# Patient Record
Sex: Female | Born: 1970 | Race: Black or African American | Hispanic: No | Marital: Single
Health system: Southern US, Academic
[De-identification: ages and names within clinical notes are randomized; demographics above are authoritative.]

## PROBLEM LIST (undated history)

## (undated) ENCOUNTER — Encounter

## (undated) ENCOUNTER — Telehealth

## (undated) ENCOUNTER — Encounter: Payer: PRIVATE HEALTH INSURANCE | Attending: Family | Primary: Family

## (undated) ENCOUNTER — Telehealth: Payer: PRIVATE HEALTH INSURANCE

## (undated) ENCOUNTER — Ambulatory Visit: Payer: BLUE CROSS/BLUE SHIELD

## (undated) ENCOUNTER — Ambulatory Visit

## (undated) ENCOUNTER — Telehealth: Attending: Family | Primary: Family

## (undated) ENCOUNTER — Encounter: Attending: Family | Primary: Family

## (undated) ENCOUNTER — Ambulatory Visit: Payer: PRIVATE HEALTH INSURANCE

## (undated) ENCOUNTER — Ambulatory Visit: Payer: Medicaid (Managed Care)

## (undated) ENCOUNTER — Ambulatory Visit: Payer: PRIVATE HEALTH INSURANCE | Attending: Family | Primary: Family

## (undated) ENCOUNTER — Ambulatory Visit: Payer: PRIVATE HEALTH INSURANCE | Attending: Medical | Primary: Medical

## (undated) ENCOUNTER — Ambulatory Visit: Payer: BLUE CROSS/BLUE SHIELD | Attending: Family | Primary: Family

## (undated) ENCOUNTER — Encounter: Attending: Internal Medicine | Primary: Internal Medicine

## (undated) ENCOUNTER — Ambulatory Visit
Payer: PRIVATE HEALTH INSURANCE | Attending: Rehabilitative and Restorative Service Providers" | Primary: Rehabilitative and Restorative Service Providers"

## (undated) ENCOUNTER — Encounter
Attending: Student in an Organized Health Care Education/Training Program | Primary: Student in an Organized Health Care Education/Training Program

## (undated) ENCOUNTER — Ambulatory Visit
Payer: BLUE CROSS/BLUE SHIELD | Attending: Student in an Organized Health Care Education/Training Program | Primary: Student in an Organized Health Care Education/Training Program

## (undated) ENCOUNTER — Ambulatory Visit: Attending: Family | Primary: Family

## (undated) ENCOUNTER — Encounter: Payer: BLUE CROSS/BLUE SHIELD | Attending: Family | Primary: Family

## (undated) DIAGNOSIS — L732 Hidradenitis suppurativa: Secondary | ICD-10-CM

## (undated) DIAGNOSIS — K219 Gastro-esophageal reflux disease without esophagitis: Secondary | ICD-10-CM

## (undated) DIAGNOSIS — E785 Hyperlipidemia, unspecified: Secondary | ICD-10-CM

## (undated) DIAGNOSIS — I10 Essential (primary) hypertension: Secondary | ICD-10-CM

---

## 2005-01-27 ENCOUNTER — Emergency Department: Payer: Self-pay | Admitting: Emergency Medicine

## 2005-07-13 ENCOUNTER — Emergency Department: Payer: Self-pay | Admitting: Emergency Medicine

## 2006-01-22 ENCOUNTER — Emergency Department: Payer: Self-pay | Admitting: Internal Medicine

## 2006-06-15 ENCOUNTER — Emergency Department: Payer: Self-pay | Admitting: Unknown Physician Specialty

## 2006-07-21 ENCOUNTER — Emergency Department: Payer: Self-pay | Admitting: Emergency Medicine

## 2008-10-25 ENCOUNTER — Emergency Department: Payer: Self-pay | Admitting: Internal Medicine

## 2009-01-14 ENCOUNTER — Ambulatory Visit: Payer: Self-pay | Admitting: Family Medicine

## 2009-04-02 ENCOUNTER — Emergency Department: Payer: Self-pay | Admitting: Emergency Medicine

## 2009-04-08 ENCOUNTER — Ambulatory Visit: Payer: Self-pay | Admitting: Family Medicine

## 2009-05-22 ENCOUNTER — Emergency Department: Payer: Self-pay | Admitting: Emergency Medicine

## 2009-08-03 ENCOUNTER — Emergency Department: Payer: Self-pay | Admitting: Emergency Medicine

## 2010-07-12 ENCOUNTER — Emergency Department: Payer: Self-pay | Admitting: Emergency Medicine

## 2011-09-15 ENCOUNTER — Emergency Department: Payer: Self-pay | Admitting: *Deleted

## 2012-08-12 ENCOUNTER — Emergency Department: Payer: Self-pay | Admitting: Internal Medicine

## 2012-08-12 LAB — CBC
MCH: 26.6 pg (ref 26.0–34.0)
MCHC: 33.5 g/dL (ref 32.0–36.0)
MCV: 79 fL — ABNORMAL LOW (ref 80–100)
Platelet: 313 10*3/uL (ref 150–440)
RBC: 4.74 10*6/uL (ref 3.80–5.20)
RDW: 13.4 % (ref 11.5–14.5)

## 2012-08-12 LAB — COMPREHENSIVE METABOLIC PANEL
Anion Gap: 12 (ref 7–16)
Bilirubin,Total: 0.5 mg/dL (ref 0.2–1.0)
Calcium, Total: 8.9 mg/dL (ref 8.5–10.1)
Chloride: 107 mmol/L (ref 98–107)
Co2: 23 mmol/L (ref 21–32)
Creatinine: 1.03 mg/dL (ref 0.60–1.30)
EGFR (African American): >60
EGFR (Non-African Amer.): >60
Osmolality: 282 (ref 275–301)
Potassium: 3.5 mmol/L (ref 3.5–5.1)
SGOT(AST): 19 U/L (ref 15–37)
SGPT (ALT): 22 U/L (ref 12–78)

## 2012-08-12 LAB — CK TOTAL AND CKMB (NOT AT ARMC)
CK, Total: 76 U/L (ref 21–215)
CK-MB: 0.6 ng/mL (ref 0.5–3.6)

## 2012-08-12 LAB — TROPONIN I: Troponin-I: <0.02 ng/mL

## 2012-09-14 ENCOUNTER — Emergency Department: Payer: Self-pay | Admitting: Internal Medicine

## 2012-09-14 LAB — CBC
HCT: 36.5 % (ref 35.0–47.0)
HGB: 12.3 g/dL (ref 12.0–16.0)
MCH: 26.6 pg (ref 26.0–34.0)
MCV: 79 fL — ABNORMAL LOW (ref 80–100)
Platelet: 299 10*3/uL (ref 150–440)
RBC: 4.6 10*6/uL (ref 3.80–5.20)
RDW: 13.2 % (ref 11.5–14.5)
WBC: 7 10*3/uL (ref 3.6–11.0)

## 2012-09-14 LAB — COMPREHENSIVE METABOLIC PANEL
Albumin: 3.8 g/dL (ref 3.4–5.0)
Alkaline Phosphatase: 62 U/L (ref 50–136)
Bilirubin,Total: 0.5 mg/dL (ref 0.2–1.0)
Calcium, Total: 8.8 mg/dL (ref 8.5–10.1)
Co2: 26 mmol/L (ref 21–32)
Creatinine: 0.87 mg/dL (ref 0.60–1.30)
Glucose: 96 mg/dL (ref 65–99)
Osmolality: 282 (ref 275–301)
SGOT(AST): 19 U/L (ref 15–37)

## 2012-09-14 LAB — URINALYSIS, COMPLETE
Bilirubin,UR: NEGATIVE
Blood: NEGATIVE
Glucose,UR: NEGATIVE mg/dL (ref 0–75)
Ketone: NEGATIVE
Leukocyte Esterase: NEGATIVE
Nitrite: NEGATIVE
Ph: 6 (ref 4.5–8.0)
Protein: NEGATIVE
RBC,UR: 1 /HPF (ref 0–5)
Specific Gravity: 1.027 (ref 1.003–1.030)

## 2012-09-14 LAB — PREGNANCY, URINE: Pregnancy Test, Urine: NEGATIVE m[IU]/mL

## 2012-09-14 LAB — TROPONIN I: Troponin-I: <0.02 ng/mL

## 2012-11-28 ENCOUNTER — Emergency Department: Payer: Self-pay | Admitting: Internal Medicine

## 2012-11-30 LAB — BETA STREP CULTURE(ARMC)

## 2012-12-16 ENCOUNTER — Emergency Department: Payer: Self-pay | Admitting: Emergency Medicine

## 2012-12-16 LAB — CBC
HCT: 38.8 % (ref 35.0–47.0)
MCH: 26.5 pg (ref 26.0–34.0)
MCHC: 33.2 g/dL (ref 32.0–36.0)
MCV: 80 fL (ref 80–100)
RBC: 4.86 10*6/uL (ref 3.80–5.20)
WBC: 5.6 10*3/uL (ref 3.6–11.0)

## 2012-12-16 LAB — CK TOTAL AND CKMB (NOT AT ARMC)
CK, Total: 61 U/L (ref 21–215)
CK-MB: <0.5 ng/mL — ABNORMAL LOW (ref 0.5–3.6)

## 2012-12-16 LAB — BASIC METABOLIC PANEL
Anion Gap: 6 — ABNORMAL LOW (ref 7–16)
BUN: 15 mg/dL (ref 7–18)
Co2: 25 mmol/L (ref 21–32)
Creatinine: 0.88 mg/dL (ref 0.60–1.30)
Glucose: 104 mg/dL — ABNORMAL HIGH (ref 65–99)
Potassium: 4.1 mmol/L (ref 3.5–5.1)

## 2012-12-16 LAB — TROPONIN I: Troponin-I: <0.02 ng/mL

## 2013-01-09 ENCOUNTER — Emergency Department: Payer: Self-pay | Admitting: Emergency Medicine

## 2013-01-09 LAB — CBC
HCT: 40.2 % (ref 35.0–47.0)
HGB: 13.3 g/dL (ref 12.0–16.0)
MCH: 26.4 pg (ref 26.0–34.0)
Platelet: 269 10*3/uL (ref 150–440)
RBC: 5.02 10*6/uL (ref 3.80–5.20)

## 2013-01-09 LAB — BASIC METABOLIC PANEL
BUN: 15 mg/dL (ref 7–18)
Calcium, Total: 8.8 mg/dL (ref 8.5–10.1)
Creatinine: 0.86 mg/dL (ref 0.60–1.30)
EGFR (African American): >60
EGFR (Non-African Amer.): >60
Glucose: 95 mg/dL (ref 65–99)
Osmolality: 276 (ref 275–301)
Potassium: 4.4 mmol/L (ref 3.5–5.1)
Sodium: 138 mmol/L (ref 136–145)

## 2013-01-09 LAB — TROPONIN I: Troponin-I: <0.02 ng/mL

## 2013-04-28 ENCOUNTER — Emergency Department: Payer: Self-pay | Admitting: Emergency Medicine

## 2013-04-28 LAB — TROPONIN I: Troponin-I: <0.02 ng/mL

## 2013-04-28 LAB — COMPREHENSIVE METABOLIC PANEL
BUN: 13 mg/dL (ref 7–18)
Calcium, Total: 9 mg/dL (ref 8.5–10.1)
Creatinine: 0.95 mg/dL (ref 0.60–1.30)
EGFR (African American): >60
EGFR (Non-African Amer.): >60
Osmolality: 279 (ref 275–301)
Potassium: 3.7 mmol/L (ref 3.5–5.1)
SGOT(AST): 13 U/L — ABNORMAL LOW (ref 15–37)
SGPT (ALT): 19 U/L (ref 12–78)
Total Protein: 7.5 g/dL (ref 6.4–8.2)

## 2013-04-28 LAB — CBC
HCT: 36.4 % (ref 35.0–47.0)
HGB: 12.3 g/dL (ref 12.0–16.0)
MCH: 26.7 pg (ref 26.0–34.0)
MCHC: 33.8 g/dL (ref 32.0–36.0)
RBC: 4.61 10*6/uL (ref 3.80–5.20)

## 2013-08-23 ENCOUNTER — Emergency Department: Payer: Self-pay | Admitting: Emergency Medicine

## 2013-08-23 LAB — CBC
HGB: 12.5 g/dL (ref 12.0–16.0)
MCH: 26.5 pg (ref 26.0–34.0)
RBC: 4.73 10*6/uL (ref 3.80–5.20)
WBC: 7.9 10*3/uL (ref 3.6–11.0)

## 2013-08-23 LAB — BASIC METABOLIC PANEL
Anion Gap: 7 (ref 7–16)
Calcium, Total: 9.4 mg/dL (ref 8.5–10.1)
Creatinine: 0.93 mg/dL (ref 0.60–1.30)
EGFR (Non-African Amer.): >60
Potassium: 3.7 mmol/L (ref 3.5–5.1)

## 2013-10-20 ENCOUNTER — Emergency Department: Payer: Self-pay | Admitting: Emergency Medicine

## 2013-11-06 ENCOUNTER — Emergency Department: Payer: Self-pay | Admitting: Emergency Medicine

## 2013-11-06 LAB — URINALYSIS, COMPLETE
Bilirubin,UR: NEGATIVE
Ketone: NEGATIVE
Ph: 6 (ref 4.5–8.0)
Specific Gravity: 1.026 (ref 1.003–1.030)
Squamous Epithelial: 14

## 2013-11-06 LAB — CBC
HCT: 37.5 % (ref 35.0–47.0)
MCH: 26 pg (ref 26.0–34.0)
MCHC: 33 g/dL (ref 32.0–36.0)
RBC: 4.75 10*6/uL (ref 3.80–5.20)
WBC: 9.5 10*3/uL (ref 3.6–11.0)

## 2013-11-06 LAB — TROPONIN I: Troponin-I: <0.02 ng/mL

## 2013-11-06 LAB — BASIC METABOLIC PANEL
Anion Gap: 4 — ABNORMAL LOW (ref 7–16)
BUN: 14 mg/dL (ref 7–18)
Chloride: 106 mmol/L (ref 98–107)
Co2: 29 mmol/L (ref 21–32)
Creatinine: 0.84 mg/dL (ref 0.60–1.30)
EGFR (African American): >60
Osmolality: 278 (ref 275–301)

## 2014-01-18 ENCOUNTER — Emergency Department: Payer: Self-pay | Admitting: Emergency Medicine

## 2014-01-19 ENCOUNTER — Emergency Department: Payer: Self-pay | Admitting: Internal Medicine

## 2014-05-08 ENCOUNTER — Emergency Department: Payer: Self-pay | Admitting: Emergency Medicine

## 2014-06-15 ENCOUNTER — Emergency Department: Payer: Self-pay | Admitting: Emergency Medicine

## 2014-06-17 IMAGING — CR DG CHEST 2V
1 series · 3 of 3 positions shown · non-contrast
Comparison: none

REASON FOR EXAM: Chest Pain
COMMENTS:

PROCEDURE:     DXR - DXR CHEST PA (OR AP) AND LATERAL  - December 16, 2012  [DATE]
RESULT:     Comparison: 09/14/2012

[Series 1: pa · 0.17mm/px · 3 of 3 slices shown]
[im 1/3]
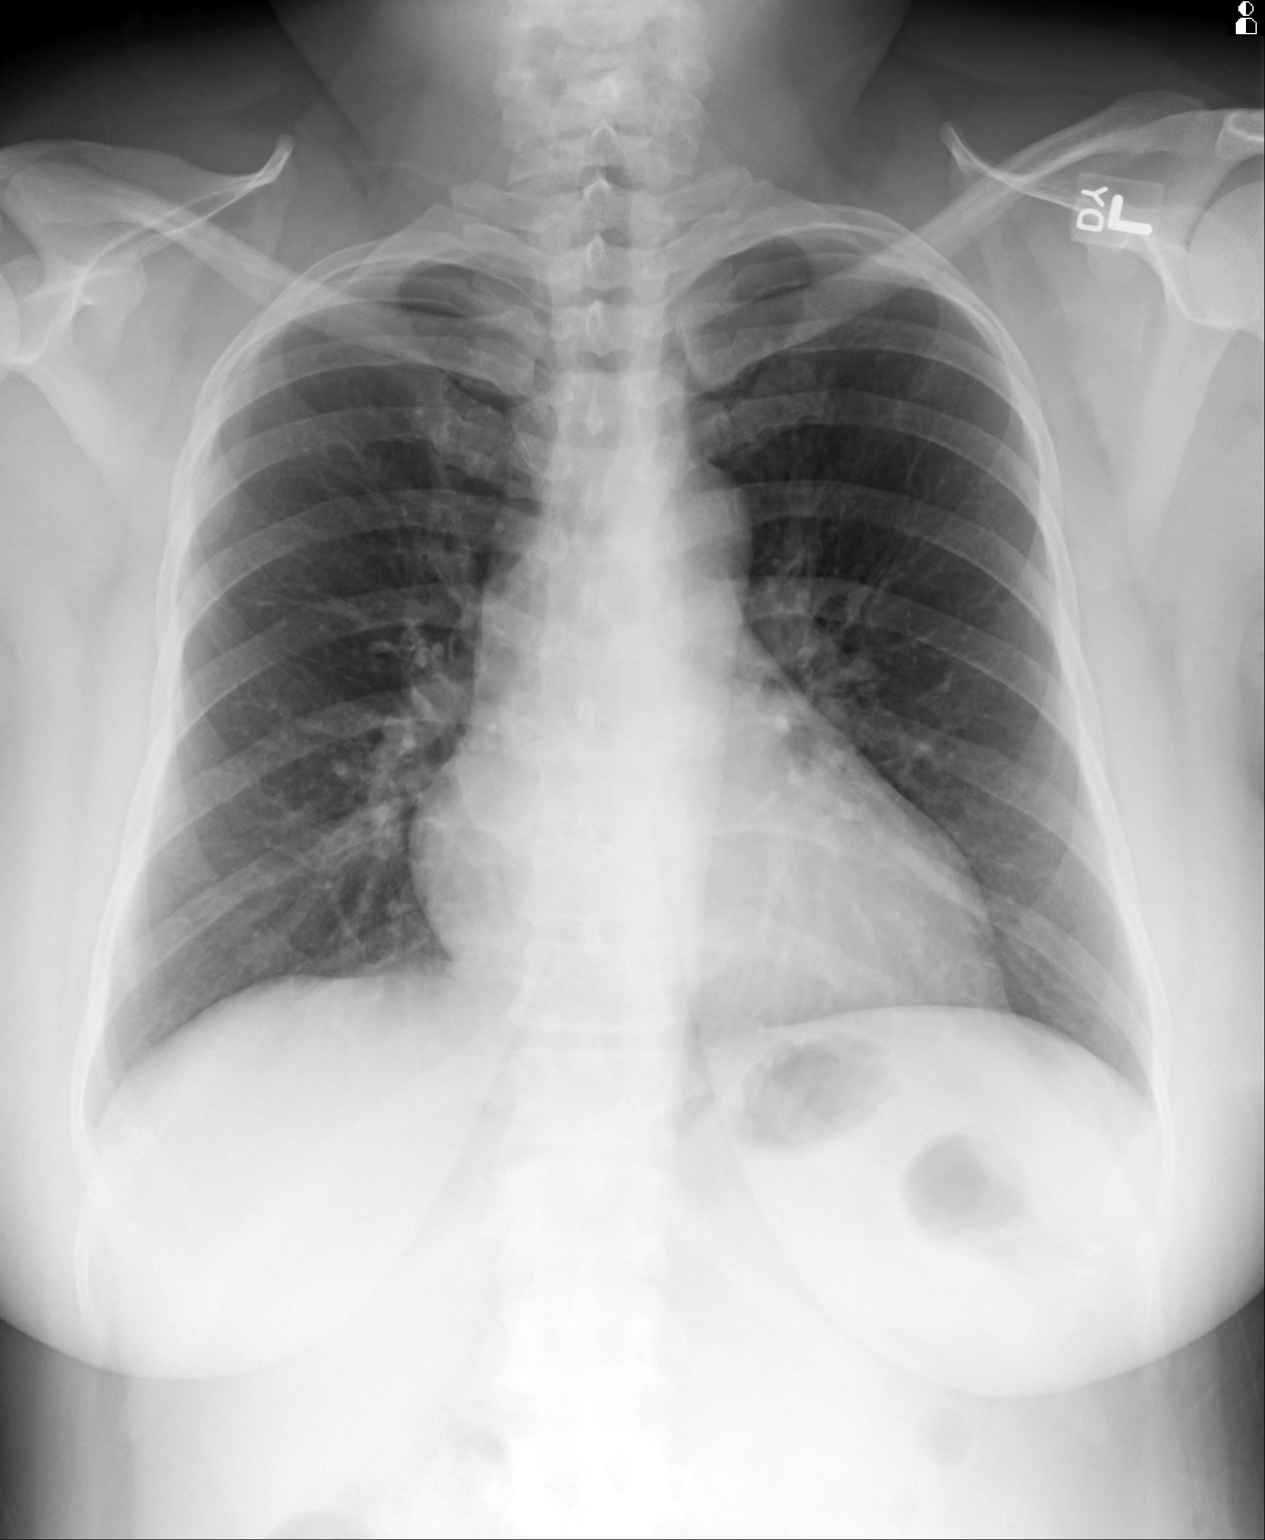
[im 2/3]
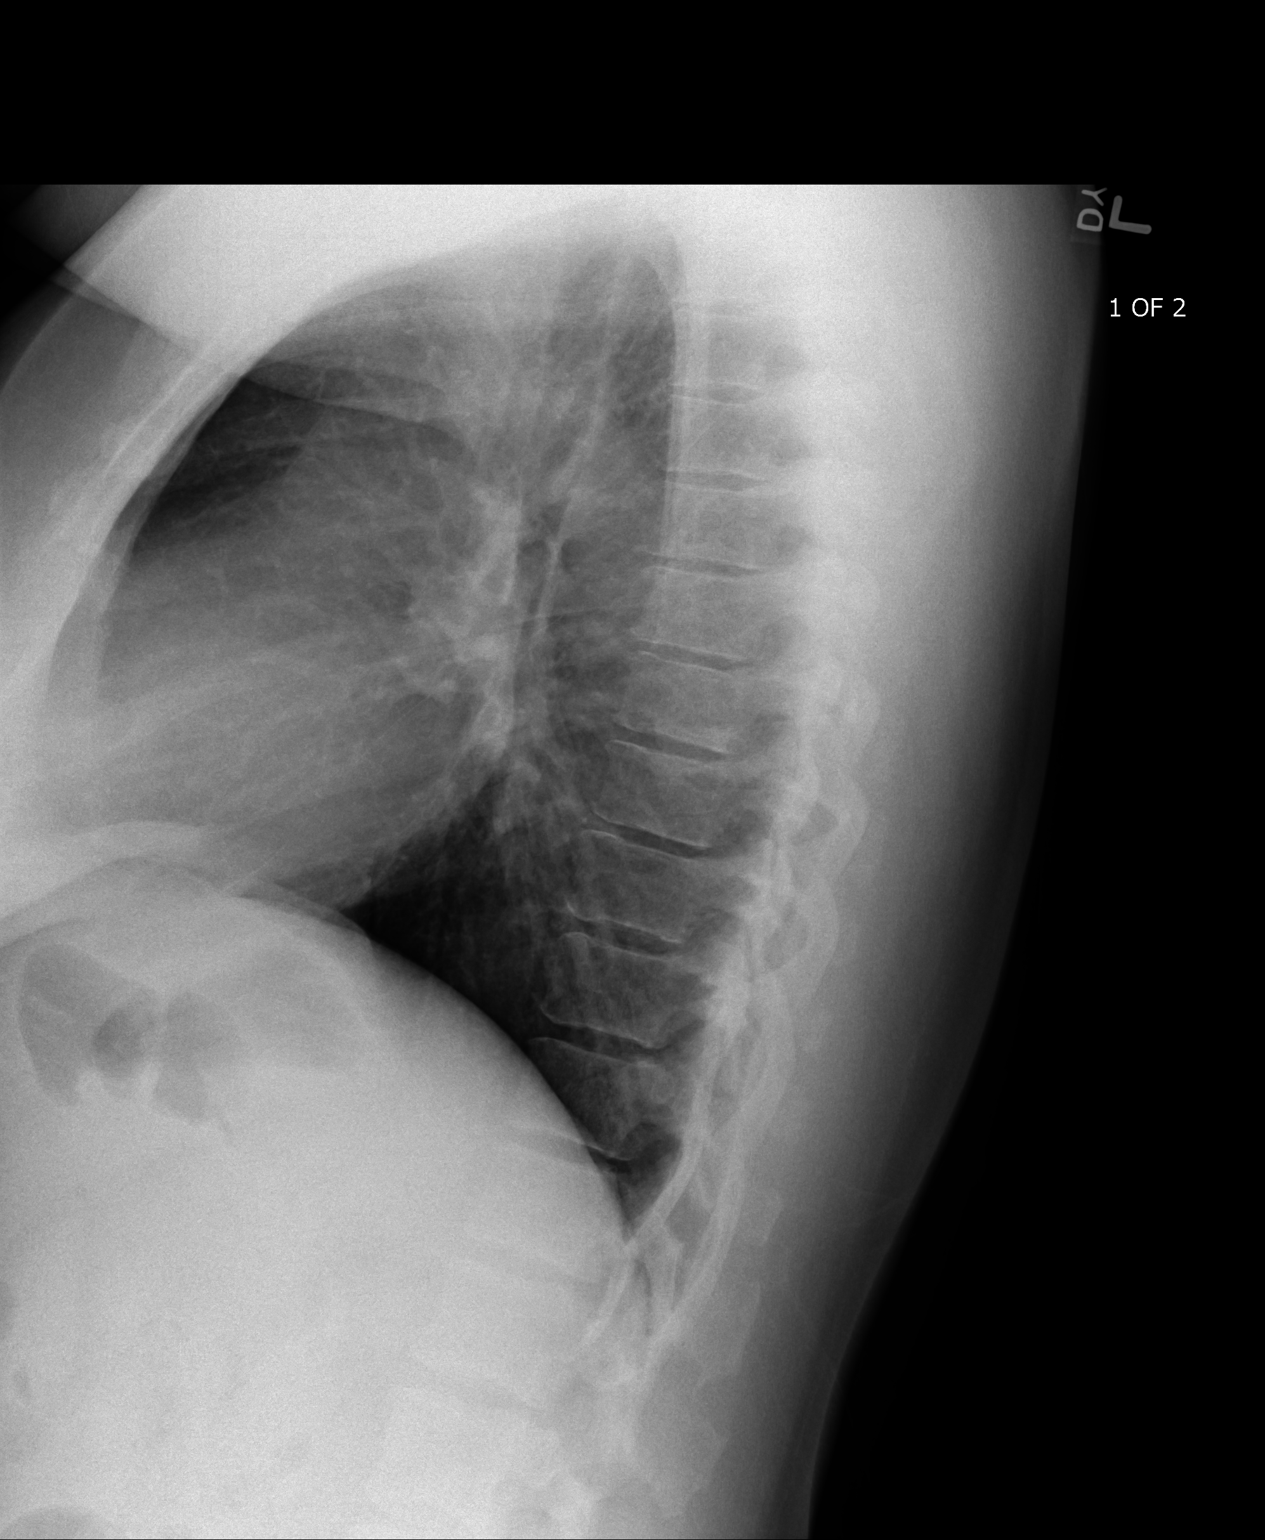
[im 3/3]
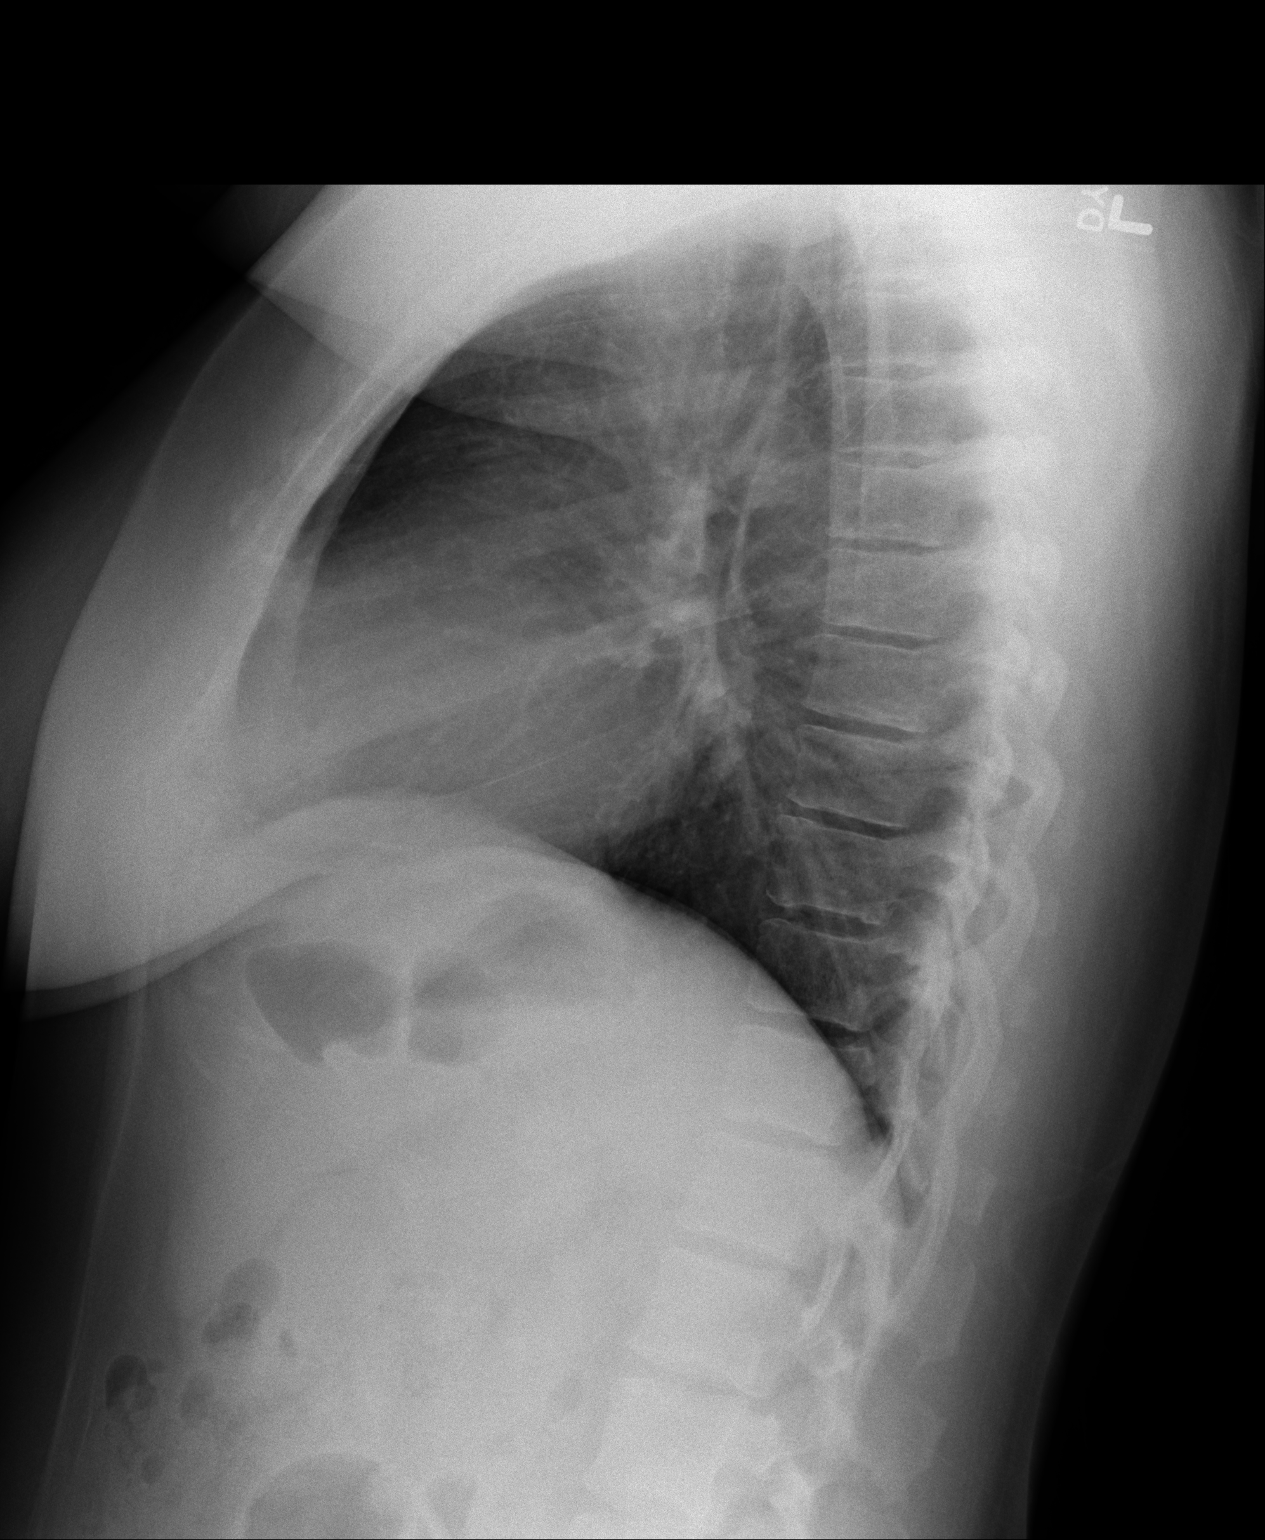

[3 of 3 positions shown; findings below may reference images not displayed]

FINDINGS: Heart size upper limits of normal. No focal pulmonary opacities.
IMPRESSION: No acute cardiopulmonary disease.

[REDACTED]

## 2014-06-19 ENCOUNTER — Emergency Department: Payer: Self-pay | Admitting: Emergency Medicine

## 2014-10-05 ENCOUNTER — Emergency Department: Payer: Self-pay | Admitting: Internal Medicine

## 2014-10-07 LAB — BETA STREP CULTURE(ARMC)

## 2014-10-11 ENCOUNTER — Emergency Department: Payer: Self-pay | Admitting: Internal Medicine

## 2014-10-11 LAB — COMPREHENSIVE METABOLIC PANEL
ANION GAP: 6 — AB (ref 7–16)
AST: 13 U/L — AB (ref 15–37)
Albumin: 3.6 g/dL (ref 3.4–5.0)
Alkaline Phosphatase: 79 U/L
BILIRUBIN TOTAL: 0.4 mg/dL (ref 0.2–1.0)
BUN: 14 mg/dL (ref 7–18)
CALCIUM: 8.9 mg/dL (ref 8.5–10.1)
Chloride: 105 mmol/L (ref 98–107)
Co2: 27 mmol/L (ref 21–32)
Creatinine: 0.81 mg/dL (ref 0.60–1.30)
EGFR (African American): >60
EGFR (Non-African Amer.): >60
GLUCOSE: 105 mg/dL — AB (ref 65–99)
Osmolality: 277 (ref 275–301)
POTASSIUM: 3.9 mmol/L (ref 3.5–5.1)
SGPT (ALT): 24 U/L
Sodium: 138 mmol/L (ref 136–145)
Total Protein: 7.5 g/dL (ref 6.4–8.2)

## 2014-10-11 LAB — URINALYSIS, COMPLETE
BILIRUBIN, UR: NEGATIVE
Bacteria: NONE SEEN
Glucose,UR: NEGATIVE mg/dL (ref 0–75)
KETONE: NEGATIVE
LEUKOCYTE ESTERASE: NEGATIVE
Nitrite: NEGATIVE
Ph: 6 (ref 4.5–8.0)
RBC,UR: 2051 /HPF (ref 0–5)
Specific Gravity: 1.01 (ref 1.003–1.030)
WBC UR: 4 /HPF (ref 0–5)

## 2014-10-11 LAB — CBC
HCT: 39.6 % (ref 35.0–47.0)
HGB: 12.7 g/dL (ref 12.0–16.0)
MCH: 26.1 pg (ref 26.0–34.0)
MCHC: 32 g/dL (ref 32.0–36.0)
MCV: 82 fL (ref 80–100)
Platelet: 305 10*3/uL (ref 150–440)
RBC: 4.87 10*6/uL (ref 3.80–5.20)
RDW: 13.9 % (ref 11.5–14.5)
WBC: 7.5 10*3/uL (ref 3.6–11.0)

## 2014-10-28 ENCOUNTER — Emergency Department: Payer: Self-pay | Admitting: Emergency Medicine

## 2014-10-28 LAB — INFLUENZA A,B,H1N1 - PCR (ARMC)
H1N1FLUPCR: NOT DETECTED
Influenza A By PCR: NEGATIVE
Influenza B By PCR: NEGATIVE

## 2014-10-30 ENCOUNTER — Emergency Department: Payer: Self-pay | Admitting: Emergency Medicine

## 2014-11-01 LAB — BETA STREP CULTURE(ARMC)

## 2014-11-02 ENCOUNTER — Emergency Department: Payer: Self-pay | Admitting: Emergency Medicine

## 2014-11-02 LAB — COMPREHENSIVE METABOLIC PANEL
ALK PHOS: 93 U/L
Albumin: 3.2 g/dL — ABNORMAL LOW (ref 3.4–5.0)
Anion Gap: 9 (ref 7–16)
BUN: 14 mg/dL (ref 7–18)
Bilirubin,Total: 0.2 mg/dL (ref 0.2–1.0)
CALCIUM: 8.8 mg/dL (ref 8.5–10.1)
CO2: 27 mmol/L (ref 21–32)
Chloride: 105 mmol/L (ref 98–107)
Creatinine: 0.86 mg/dL (ref 0.60–1.30)
EGFR (African American): >60
EGFR (Non-African Amer.): >60
GLUCOSE: 110 mg/dL — AB (ref 65–99)
Osmolality: 282 (ref 275–301)
POTASSIUM: 3.5 mmol/L (ref 3.5–5.1)
SGOT(AST): 20 U/L (ref 15–37)
SGPT (ALT): 66 U/L — ABNORMAL HIGH
SODIUM: 141 mmol/L (ref 136–145)
TOTAL PROTEIN: 7.3 g/dL (ref 6.4–8.2)

## 2014-11-02 LAB — CBC WITH DIFFERENTIAL/PLATELET
BASOS PCT: 0.6 %
Basophil #: 0.1 10*3/uL (ref 0.0–0.1)
Eosinophil #: 0.3 10*3/uL (ref 0.0–0.7)
Eosinophil %: 2.9 %
HCT: 36.6 % (ref 35.0–47.0)
HGB: 11.7 g/dL — AB (ref 12.0–16.0)
LYMPHS ABS: 3 10*3/uL (ref 1.0–3.6)
LYMPHS PCT: 30.7 %
MCH: 25.9 pg — AB (ref 26.0–34.0)
MCHC: 31.8 g/dL — ABNORMAL LOW (ref 32.0–36.0)
MCV: 81 fL (ref 80–100)
MONO ABS: 0.9 x10 3/mm (ref 0.2–0.9)
MONOS PCT: 9.6 %
Neutrophil #: 5.4 10*3/uL (ref 1.4–6.5)
Neutrophil %: 56.2 %
Platelet: 309 10*3/uL (ref 150–440)
RBC: 4.5 10*6/uL (ref 3.80–5.20)
RDW: 13.7 % (ref 11.5–14.5)
WBC: 9.6 10*3/uL (ref 3.6–11.0)

## 2014-11-02 LAB — LIPASE, BLOOD: LIPASE: 132 U/L (ref 73–393)

## 2014-11-02 LAB — TROPONIN I: Troponin-I: <0.02 ng/mL

## 2015-04-07 ENCOUNTER — Encounter: Payer: Self-pay | Admitting: Emergency Medicine

## 2015-04-07 ENCOUNTER — Emergency Department
Admission: EM | Admit: 2015-04-07 | Discharge: 2015-04-07 | Disposition: A | Discharge status: Home / Self Care | Payer: Self-pay | Attending: Emergency Medicine | Admitting: Emergency Medicine

## 2015-04-07 ENCOUNTER — Emergency Department: Payer: Self-pay

## 2015-04-07 DIAGNOSIS — I10 Essential (primary) hypertension: Secondary | ICD-10-CM | POA: Insufficient documentation

## 2015-04-07 DIAGNOSIS — M25561 Pain in right knee: Secondary | ICD-10-CM | POA: Insufficient documentation

## 2015-04-07 HISTORY — DX: Essential (primary) hypertension: I10

## 2015-04-07 MED ORDER — IBUPROFEN 600 MG PO TABS: 600.0000 mg | ORAL_TABLET | Freq: Four times a day (QID) | ORAL | Status: DC | PRN

## 2015-04-07 NOTE — ED Notes (Signed)
Right knee pain no swelling.  Pt says no injury.  Pain especially with movement of joint and also with weight bearing

## 2015-04-07 NOTE — ED Provider Notes (Signed)
[facility] Emergency Department Provider Note   ____________________________________________  Time seen: 1500  I have reviewed the triage vital signs and the nursing notes.   HISTORY  Chief Complaint Knee Pain   History limited by: Not Limited   HPI Alice Stephens is a 43 y.o. female who comes to the emergency departmenttoday because of right knee pain. The pain started yesterday. The patient denies any trauma to her knee. States pain is located over patella. It is worse with bending of the knee. It is worse with pressure to the knee. Sharp. States she has not tried anything for the pain. Has not noticed any swelling or warmth to the joint. No fevers, vomiting.     Past Medical History  Diagnosis Date  . Hypertension     There are no active problems to display for this patient.   History reviewed. No pertinent past surgical history.  No current outpatient prescriptions on file.  Allergies Review of patient's allergies indicates no known allergies.  History reviewed. No pertinent family history.  Social History History  Substance Use Topics  . Smoking status: Never Smoker   . Smokeless tobacco: Not on file  . Alcohol Use: No    Review of Systems  Constitutional: Negative for fever. Cardiovascular: Negative for chest pain. Respiratory: Negative for shortness of breath. Gastrointestinal: Negative for abdominal pain, vomiting and diarrhea. Genitourinary: Negative for dysuria. Musculoskeletal: Negative for back pain. Right knee pain Skin: Negative for rash. Neurological: Negative for headaches, focal weakness or numbness.  10-point ROS otherwise negative.  ____________________________________________   PHYSICAL EXAM:  VITAL SIGNS: ED Triage Vitals  Enc Vitals Group     BP 04/07/15 1420 157/112 mmHg     Pulse Rate 04/07/15 1420 92     Resp 04/07/15 1420 14     Temp --      Temp src --      SpO2 04/07/15 1420 97 %      Weight --      Height --      Head Cir --      Peak Flow --      Pain Score 04/07/15 1420 4   Constitutional: Alert and oriented. Well appearing and in no distress. Eyes: Conjunctivae are normal. PERRL. Normal extraocular movements. ENT   Head: Normocephalic and atraumatic.   Nose: No congestion/rhinnorhea.   Mouth/Throat: Mucous membranes are moist.   Neck: No stridor. Cardiovascular: Normal rate, regular rhythm.   Respiratory: Normal respiratory effort without tachypnea nor retractions.  Genitourinary: Deferred Musculoskeletal: Right knee without any effusion, warmth or erythema. Mild tenderness to flexion of the knee. Sensation intact distally. Neurologic:  Normal speech and language. No gross focal neurologic deficits are appreciated. Speech is normal.  Skin:  Skin is warm, dry and intact. No rash noted. Psychiatric: Mood and affect are normal. Speech and behavior are normal. Patient exhibits appropriate insight and judgment.  ____________________________________________    LABS (pertinent positives/negatives)  None  ____________________________________________   EKG  None  ____________________________________________    RADIOLOGY  Right Knee x-ray  IMPRESSION: Mild osteoarthritic change. No fracture or joint effusion. ____________________________________________   PROCEDURES  Procedure(s) performed: None  Critical Care performed: No  ____________________________________________   INITIAL IMPRESSION / ASSESSMENT AND PLAN / ED COURSE  Pertinent labs & imaging results that were available during my care of the patient were reviewed by me and considered in my medical decision making (see chart for details).  Patient here with right knee pain for  2 days. Will get x-ray.  ----------------------------------------- 3:39 PM on 04/07/2015 -----------------------------------------  X-ray negative. Will discharge home with high-dose  ibuprofen.  ____________________________________________   FINAL CLINICAL IMPRESSION(S) / ED DIAGNOSES  Final diagnoses:  Right knee pain     Phineas SemenGraydon Theodosia Bahena, MD 04/07/15 1539

## 2015-04-07 NOTE — Discharge Instructions (Signed)
Please seek medical attention for any high fevers, chest pain, shortness of breath, change in behavior, persistent vomiting, bloody stool or any other new or concerning symptoms. °Arthritis, Nonspecific °Arthritis is inflammation of a joint. This usually means pain, redness, warmth or swelling are present. One or more joints may be involved. There are a number of types of arthritis. Your caregiver may not be able to tell what type of arthritis you have right away. °CAUSES  °The most common cause of arthritis is the wear and tear on the joint (osteoarthritis). This causes damage to the cartilage, which can break down over time. The knees, hips, back and neck are most often affected by this type of arthritis. °Other types of arthritis and common causes of joint pain include: °· Sprains and other injuries near the joint. Sometimes minor sprains and injuries cause pain and swelling that develop hours later. °· Rheumatoid arthritis. This affects hands, feet and knees. It usually affects both sides of your body at the same time. It is often associated with chronic ailments, fever, weight loss and general weakness. °· Crystal arthritis. Gout and pseudo gout can cause occasional acute severe pain, redness and swelling in the foot, ankle, or knee. °· Infectious arthritis. Bacteria can get into a joint through a break in overlying skin. This can cause infection of the joint. Bacteria and viruses can also spread through the blood and affect your joints. °· Drug, infectious and allergy reactions. Sometimes joints can become mildly painful and slightly swollen with these types of illnesses. °SYMPTOMS  °· Pain is the main symptom. °· Your joint or joints can also be red, swollen and warm or hot to the touch. °· You may have a fever with certain types of arthritis, or even feel overall ill. °· The joint with arthritis will hurt with movement. Stiffness is present with some types of arthritis. °DIAGNOSIS  °Your caregiver will  suspect arthritis based on your description of your symptoms and on your exam. Testing may be needed to find the type of arthritis: °· Blood and sometimes urine tests. °· X-ray tests and sometimes CT or MRI scans. °· Removal of fluid from the joint (arthrocentesis) is done to check for bacteria, crystals or other causes. Your caregiver (or a specialist) will numb the area over the joint with a local anesthetic, and use a needle to remove joint fluid for examination. This procedure is only minimally uncomfortable. °· Even with these tests, your caregiver may not be able to tell what kind of arthritis you have. Consultation with a specialist (rheumatologist) may be helpful. °TREATMENT  °Your caregiver will discuss with you treatment specific to your type of arthritis. If the specific type cannot be determined, then the following general recommendations may apply. °Treatment of severe joint pain includes: °· Rest. °· Elevation. °· Anti-inflammatory medication (for example, ibuprofen) may be prescribed. Avoiding activities that cause increased pain. °· Only take over-the-counter or prescription medicines for pain and discomfort as recommended by your caregiver. °· Cold packs over an inflamed joint may be used for 10 to 15 minutes every hour. Hot packs sometimes feel better, but do not use overnight. Do not use hot packs if you are diabetic without your caregiver's permission. °· A cortisone shot into arthritic joints may help reduce pain and swelling. °· Any acute arthritis that gets worse over the next 1 to 2 days needs to be looked at to be sure there is no joint infection. °Long-term arthritis treatment involves modifying activities and lifestyle   to reduce joint stress jarring. This can include weight loss. Also, exercise is needed to nourish the joint cartilage and remove waste. This helps keep the muscles around the joint strong. °HOME CARE INSTRUCTIONS  °· Do not take aspirin to relieve pain if gout is suspected.  This elevates uric acid levels. °· Only take over-the-counter or prescription medicines for pain, discomfort or fever as directed by your caregiver. °· Rest the joint as much as possible. °· If your joint is swollen, keep it elevated. °· Use crutches if the painful joint is in your leg. °· Drinking plenty of fluids may help for certain types of arthritis. °· Follow your caregiver's dietary instructions. °· Try low-impact exercise such as: °¨ Swimming. °¨ Water aerobics. °¨ Biking. °¨ Walking. °· Morning stiffness is often relieved by a warm shower. °· Put your joints through regular range-of-motion. °SEEK MEDICAL CARE IF:  °· You do not feel better in 24 hours or are getting worse. °· You have side effects to medications, or are not getting better with treatment. °SEEK IMMEDIATE MEDICAL CARE IF:  °· You have a fever. °· You develop severe joint pain, swelling or redness. °· Many joints are involved and become painful and swollen. °· There is severe back pain and/or leg weakness. °· You have loss of bowel or bladder control. °Document Released: 12/01/2004 Document Revised: 01/16/2012 Document Reviewed: 12/17/2008 °ExitCare® Patient Information ©2015 [employer]. This information is not intended to replace advice given to you by your health care provider. Make sure you discuss any questions you have with your health care provider. ° °

## 2015-06-07 ENCOUNTER — Other Ambulatory Visit: Payer: Self-pay

## 2015-06-07 ENCOUNTER — Emergency Department
Admission: EM | Admit: 2015-06-07 | Discharge: 2015-06-07 | Disposition: A | Discharge status: Home / Self Care | Payer: Self-pay | Attending: Emergency Medicine | Admitting: Emergency Medicine

## 2015-06-07 ENCOUNTER — Encounter: Payer: Self-pay | Admitting: *Deleted

## 2015-06-07 DIAGNOSIS — I10 Essential (primary) hypertension: Secondary | ICD-10-CM | POA: Insufficient documentation

## 2015-06-07 DIAGNOSIS — Z3202 Encounter for pregnancy test, result negative: Secondary | ICD-10-CM | POA: Insufficient documentation

## 2015-06-07 DIAGNOSIS — K2970 Gastritis, unspecified, without bleeding: Secondary | ICD-10-CM | POA: Insufficient documentation

## 2015-06-07 HISTORY — DX: Gastro-esophageal reflux disease without esophagitis: K21.9

## 2015-06-07 LAB — COMPREHENSIVE METABOLIC PANEL
ALBUMIN: 4.3 g/dL (ref 3.5–5.0)
ALT: 18 U/L (ref 14–54)
ANION GAP: 9 (ref 5–15)
AST: 18 U/L (ref 15–41)
Alkaline Phosphatase: 68 U/L (ref 38–126)
BUN: 10 mg/dL (ref 6–20)
CALCIUM: 9.2 mg/dL (ref 8.9–10.3)
CO2: 24 mmol/L (ref 22–32)
Chloride: 105 mmol/L (ref 101–111)
Creatinine, Ser: 0.72 mg/dL (ref 0.44–1.00)
GFR calc Af Amer: >60 mL/min (ref >60)
GLUCOSE: 105 mg/dL — AB (ref 65–99)
POTASSIUM: 3.7 mmol/L (ref 3.5–5.1)
Sodium: 138 mmol/L (ref 135–145)
Total Bilirubin: 0.4 mg/dL (ref 0.3–1.2)
Total Protein: 7.5 g/dL (ref 6.5–8.1)

## 2015-06-07 LAB — CBC WITH DIFFERENTIAL/PLATELET
BASOS ABS: 0.1 10*3/uL (ref 0–0.1)
BASOS PCT: 1 %
EOS ABS: 0.2 10*3/uL (ref 0–0.7)
EOS PCT: 3 %
HCT: 39.6 % (ref 35.0–47.0)
Hemoglobin: 12.7 g/dL (ref 12.0–16.0)
LYMPHS PCT: 31 %
Lymphs Abs: 2.1 10*3/uL (ref 1.0–3.6)
MCH: 25.5 pg — AB (ref 26.0–34.0)
MCHC: 32.1 g/dL (ref 32.0–36.0)
MCV: 79.2 fL — AB (ref 80.0–100.0)
Monocytes Absolute: 0.5 10*3/uL (ref 0.2–0.9)
Monocytes Relative: 7 %
Neutro Abs: 4.1 10*3/uL (ref 1.4–6.5)
Neutrophils Relative %: 58 %
PLATELETS: 300 10*3/uL (ref 150–440)
RBC: 5 MIL/uL (ref 3.80–5.20)
RDW: 14.1 % (ref 11.5–14.5)
WBC: 6.9 10*3/uL (ref 3.6–11.0)

## 2015-06-07 LAB — URINALYSIS COMPLETE WITH MICROSCOPIC (ARMC ONLY)
Bilirubin Urine: NEGATIVE
GLUCOSE, UA: NEGATIVE mg/dL
HGB URINE DIPSTICK: NEGATIVE
KETONES UR: NEGATIVE mg/dL
LEUKOCYTES UA: NEGATIVE
Nitrite: NEGATIVE
Protein, ur: NEGATIVE mg/dL
SPECIFIC GRAVITY, URINE: 1.018 (ref 1.005–1.030)
pH: 6 (ref 5.0–8.0)

## 2015-06-07 LAB — PREGNANCY, URINE: Preg Test, Ur: NEGATIVE

## 2015-06-07 LAB — AMYLASE: AMYLASE: 65 U/L (ref 28–100)

## 2015-06-07 LAB — LIPASE, BLOOD: LIPASE: 32 U/L (ref 22–51)

## 2015-06-07 MED ORDER — GI COCKTAIL ~~LOC~~
30.0000 mL | Freq: Once | ORAL | Status: AC
  Administered 2015-06-07: 30 mL via ORAL
  Filled 2015-06-07: qty 30

## 2015-06-07 NOTE — ED Notes (Signed)
At this time, pt denies pain.  Pt is reclining in bed.

## 2015-06-07 NOTE — ED Notes (Signed)
Pt stated that the GI cocktail helped.  She said that she could "still feel it a little, but it's much better."

## 2015-06-07 NOTE — ED Provider Notes (Signed)
[facility] Emergency Department Provider Note  ____________________________________________  Time seen: 8:45 AM  I have reviewed the triage vital signs and the nursing notes.   HISTORY  Chief Complaint Abdominal Pain    HPI Alice Stephens is a 44 y.o. female who presents with epigastric abdominal pain. She denies chest pain. She reports it has been bothering her for 2 daysand is burning in sensation and mild to moderate. She reports it feels like her GERD is acting up. No vomiting. No alcohol use recently. No fevers no chills. She does not smoke.     Past Medical History  Diagnosis Date  . Hypertension   . GERD (gastroesophageal reflux disease)     There are no active problems to display for this patient.   No past surgical history on file.  Current Outpatient Rx  Name  Route  Sig  Dispense  Refill  . ibuprofen (ADVIL,MOTRIN) 600 MG tablet   Oral   Take 1 tablet (600 mg total) by mouth every 6 (six) hours as needed.   25 tablet   0     Allergies Review of patient's allergies indicates no known allergies.  No family history on file.  Social History History  Substance Use Topics  . Smoking status: Never Smoker   . Smokeless tobacco: Not on file  . Alcohol Use: No    Review of Systems  Constitutional: Negative for fever. Eyes: Negative for visual changes. ENT: Negative for sore throat Cardiovascular: Negative for chest pain. Respiratory: Negative for shortness of breath. Gastrointestinal: Positive for epigastric discomfort Genitourinary: Negative for dysuria. Musculoskeletal: Negative for back pain. Skin: Negative for rash. Neurological: Negative for headaches  Psychiatric: No anxiety    ____________________________________________   PHYSICAL EXAM:  VITAL SIGNS: ED Triage Vitals  Enc Vitals Group     BP 06/07/15 0707 142/103 mmHg     Pulse Rate 06/07/15 0707 85     Resp 06/07/15 0827 14     Temp 06/07/15 0707 98  F (36.7 C)     Temp Source 06/07/15 0707 Oral     SpO2 06/07/15 0707 97 %     Weight 06/07/15 0707 210 lb (95.255 kg)     Height 06/07/15 0707  (1.727 m)     Head Cir --      Peak Flow --      Pain Score 06/07/15 0708 6     Pain Loc --      Pain Edu? --      Excl. in GC? --      Constitutional: Alert and oriented. Well appearing and in no distress. Pleasant and interactive Eyes: Conjunctivae are normal.  ENT   Head: Normocephalic and atraumatic.   Mouth/Throat: Mucous membranes are moist. Cardiovascular: Normal rate, regular rhythm. Normal and symmetric distal pulses are present in all extremities. No murmurs, rubs, or gallops. Respiratory: Normal respiratory effort without tachypnea nor retractions. Breath sounds are clear and equal bilaterally.  Gastrointestinal: Soft and non-tender in all quadrants. No distention. There is no CVA tenderness. Genitourinary: deferred Musculoskeletal: Nontender with normal range of motion in all extremities. Neurologic:  Normal speech and language. No gross focal neurologic deficits are appreciated. Skin:  Skin is warm, dry and intact. No rash noted. Psychiatric: Mood and affect are normal. Patient exhibits appropriate insight and judgment.  ____________________________________________    LABS (pertinent positives/negatives)  Labs Reviewed  COMPREHENSIVE METABOLIC PANEL - Abnormal; Notable for the following:    Glucose, Bld 105 (*)  All other components within normal limits  CBC WITH DIFFERENTIAL/PLATELET - Abnormal; Notable for the following:    MCV 79.2 (*)    MCH 25.5 (*)    All other components within normal limits  URINALYSIS COMPLETEWITH MICROSCOPIC ([facility] ONLY) - Abnormal; Notable for the following:    Color, Urine YELLOW (*)    APPearance CLEAR (*)    Bacteria, UA RARE (*)    Squamous Epithelial / LPF 0-5 (*)    All other components within normal limits  AMYLASE  LIPASE, BLOOD  PREGNANCY, URINE     ____________________________________________   EKG  ED ECG REPORT I, Jene Every, the attending physician, personally viewed and interpreted this ECG.  Date: 06/07/2015 EKG Time: 7:13 AM Rate: 85 Rhythm: normal sinus rhythm QRS Axis: normal Intervals: normal ST/T Wave abnormalities: normal Conduction Disutrbances: none Narrative Interpretation: unremarkable   ____________________________________________    RADIOLOGY I have personally reviewed any xrays that were ordered on this patient: None  ____________________________________________   PROCEDURES  Procedure(s) performed: none  Critical Care performed: none  ____________________________________________   INITIAL IMPRESSION / ASSESSMENT AND PLAN / ED COURSE  Pertinent labs & imaging results that were available during my care of the patient were reviewed by me and considered in my medical decision making (see chart for details).  Patient well-appearing complains of only mild burning discomfort in the epigastrium. She has no tenderness to palpation of her gallbladder. Her lipase is normal. No fevers no chills. Suspect gastritis. We will give GI cocktail and reevaluate   ----------------------------------------- 9:26 AM on 06/07/2015 -----------------------------------------  Patient reports GI cocktail relieved pain completely. I'll discharge the patient with close PCP follow-up as needed. Return precautions discussed  ____________________________________________   FINAL CLINICAL IMPRESSION(S) / ED DIAGNOSES  Final diagnoses:  Gastritis     Jene Every, MD 06/07/15 [phone]

## 2015-06-07 NOTE — ED Notes (Signed)
Patient is resting comfortably. 

## 2015-06-07 NOTE — Discharge Instructions (Signed)
Gastritis, Adult °Gastritis is soreness and puffiness (inflammation) of the lining of the stomach. If you do not get help, gastritis can cause bleeding and sores (ulcers) in the stomach. °HOME CARE  °· Only take medicine as told by your doctor. °· If you were given antibiotic medicines, take them as told. Finish the medicines even if you start to feel better. °· Drink enough fluids to keep your pee (urine) clear or pale yellow. °· Avoid foods and drinks that make your problems worse. Foods you may want to avoid include: °¨ Caffeine or alcohol. °¨ Chocolate. °¨ Mint. °¨ Garlic and onions. °¨ Spicy foods. °¨ Citrus fruits, including oranges, lemons, or limes. °¨ Food containing tomatoes, including sauce, chili, salsa, and pizza. °¨ Fried and fatty foods. °· Eat small meals throughout the day instead of large meals. °GET HELP RIGHT AWAY IF:  °· You have black or dark red poop (stools). °· You throw up (vomit) blood. It may look like coffee grounds. °· You cannot keep fluids down. °· Your belly (abdominal) pain gets worse. °· You have a fever. °· You do not feel better after 1 week. °· You have any other questions or concerns. °MAKE SURE YOU:  °· Understand these instructions. °· Will watch your condition. °· Will get help right away if you are not doing well or get worse. °Document Released: 04/11/2008 Document Revised: 01/16/2012 Document Reviewed: 12/07/2011 °ExitCare® Patient Information ©2015 [employer]. This information is not intended to replace advice given to you by your health care provider. Make sure you discuss any questions you have with your health care provider. ° °

## 2015-06-07 NOTE — ED Notes (Signed)
Pt denies n/v/d.  Pt describes pain as sharp, radiating from under her left breast to her right sternum, especially when she bends over.  Deep breathing makes pain worse.  Movement makes pain worse. Previously, pain was not effected by eating, but yesterday eating seemed to make pain worse.  Consequently, pt has not eaten today.

## 2015-06-07 NOTE — ED Notes (Signed)
Pt states left sided abd pain x 3 days. Pain worsens after eating. Pt has had this pain in the past but not as bad.

## 2015-11-20 ENCOUNTER — Emergency Department
Admission: EM | Admit: 2015-11-20 | Discharge: 2015-11-20 | Disposition: A | Discharge status: Home / Self Care | Payer: Self-pay | Attending: Emergency Medicine | Admitting: Emergency Medicine

## 2015-11-20 DIAGNOSIS — M62838 Other muscle spasm: Secondary | ICD-10-CM | POA: Insufficient documentation

## 2015-11-20 DIAGNOSIS — M79602 Pain in left arm: Secondary | ICD-10-CM | POA: Insufficient documentation

## 2015-11-20 DIAGNOSIS — I10 Essential (primary) hypertension: Secondary | ICD-10-CM | POA: Insufficient documentation

## 2015-11-20 DIAGNOSIS — G44219 Episodic tension-type headache, not intractable: Secondary | ICD-10-CM | POA: Insufficient documentation

## 2015-11-20 DIAGNOSIS — Z79899 Other long term (current) drug therapy: Secondary | ICD-10-CM | POA: Insufficient documentation

## 2015-11-20 HISTORY — DX: Hyperlipidemia, unspecified: E78.5

## 2015-11-20 LAB — BASIC METABOLIC PANEL
ANION GAP: 7 (ref 5–15)
BUN: 11 mg/dL (ref 6–20)
CALCIUM: 8.5 mg/dL — AB (ref 8.9–10.3)
CO2: 22 mmol/L (ref 22–32)
Chloride: 102 mmol/L (ref 101–111)
Creatinine, Ser: 0.76 mg/dL (ref 0.44–1.00)
GFR calc non Af Amer: >60 mL/min (ref >60)
GLUCOSE: 99 mg/dL (ref 65–99)
Potassium: 3.4 mmol/L — ABNORMAL LOW (ref 3.5–5.1)
Sodium: 131 mmol/L — ABNORMAL LOW (ref 135–145)

## 2015-11-20 LAB — CBC
HCT: 39 % (ref 35.0–47.0)
Hemoglobin: 12.8 g/dL (ref 12.0–16.0)
MCH: 26.1 pg (ref 26.0–34.0)
MCHC: 32.8 g/dL (ref 32.0–36.0)
MCV: 79.6 fL — ABNORMAL LOW (ref 80.0–100.0)
Platelets: 240 10*3/uL (ref 150–440)
RBC: 4.91 MIL/uL (ref 3.80–5.20)
RDW: 14 % (ref 11.5–14.5)
WBC: 5.8 10*3/uL (ref 3.6–11.0)

## 2015-11-20 LAB — TROPONIN I: Troponin I: <0.03 ng/mL (ref <0.031)

## 2015-11-20 MED ORDER — IBUPROFEN 800 MG PO TABS
800.0000 mg | ORAL_TABLET | ORAL | Status: AC
  Administered 2015-11-20: 800 mg via ORAL
  Filled 2015-11-20: qty 1

## 2015-11-20 MED ORDER — ACETAMINOPHEN 500 MG PO TABS
1000.0000 mg | ORAL_TABLET | ORAL | Status: AC
  Administered 2015-11-20: 1000 mg via ORAL
  Filled 2015-11-20: qty 2

## 2015-11-20 MED ORDER — BUTALBITAL-APAP-CAFFEINE 50-325-40 MG PO TABS: 1.0000 | ORAL_TABLET | Freq: Four times a day (QID) | ORAL | Status: AC | PRN

## 2015-11-20 NOTE — Discharge Instructions (Signed)
You have been seen in the Emergency Department (ED) for a headache.    As we have discussed, please follow up with your primary care doctor as soon as possible regarding todays Emergency Department (ED) visit and your headache symptoms.    Return to the ED if you have a worsening headache, develop a fever or stiff neck, sudden and severe headache, confusion, slurred speech, facial droop, weakness or numbness in any arm or leg, extreme fatigue, vision problems, or other symptoms that concern you.   General Headache Without Cause A headache is pain or discomfort felt around the head or neck area. The specific cause of a headache may not be found. There are many causes and types of headaches. A few common ones are:  Tension headaches.  Migraine headaches.  Cluster headaches.  Chronic daily headaches. HOME CARE INSTRUCTIONS  Watch your condition for any changes. Take these steps to help with your condition: Managing Pain  Take over-the-counter and prescription medicines only as told by your health care provider.  Lie down in a dark, quiet room when you have a headache.  If directed, apply ice to the head and neck area:  Put ice in a plastic bag.  Place a towel between your skin and the bag.  Leave the ice on for 20 minutes, 2-3 times per day.  Use a heating pad or hot shower to apply heat to the head and neck area as told by your health care provider.  Keep lights dim if bright lights bother you or make your headaches worse. Eating and Drinking  Eat meals on a regular schedule.  Limit alcohol use.  Decrease the amount of caffeine you drink, or stop drinking caffeine. General Instructions  Keep all follow-up visits as told by your health care provider. This is important.  Keep a headache journal to help find out what may trigger your headaches. For example, write down:  What you eat and drink.  How much sleep you get.  Any change to your diet or medicines.  Try  massage or other relaxation techniques.  Limit stress.  Sit up straight, and do not tense your muscles.  Do not use tobacco products, including cigarettes, chewing tobacco, or e-cigarettes. If you need help quitting, ask your health care provider.  Exercise regularly as told by your health care provider.  Sleep on a regular schedule. Get 7-9 hours of sleep, or the amount recommended by your health care provider. SEEK MEDICAL CARE IF:   Your symptoms are not helped by medicine.  You have a headache that is different from the usual headache.  You have nausea or you vomit.  You have a fever. SEEK IMMEDIATE MEDICAL CARE IF:   Your headache becomes severe.  You have repeated vomiting.  You have a stiff neck.  You have a loss of vision.  You have problems with speech.  You have pain in the eye or ear.  You have muscular weakness or loss of muscle control.  You lose your balance or have trouble walking.  You feel faint or pass out.  You have confusion.   This information is not intended to replace advice given to you by your health care provider. Make sure you discuss any questions you have with your health care provider.   Document Released: 10/24/2005 Document Revised: 07/15/2015 Document Reviewed: 02/16/2015 Else[employer] Interactive Patient Education [employer]2016 [employer]

## 2015-11-20 NOTE — ED Provider Notes (Signed)
[facility] Emergency Department Provider Note  ____________________________________________  Time seen: Approximately 8:17 AM  I have reviewed the triage vital signs and the nursing notes.   HISTORY  Chief Complaint Headache and Arm Pain    HPI Alice Stephens is a 45 y.o. female presents for evaluation of aching feeling in the left arm.  Plan partial S2 to 3 days she's had a slight headache which she describes as typical and occurring about once a month for her. It is described as a aching somewhat tightening feeling over the back of the scalp and upper neck. She reports that she frequently uses over-the-counter medicine or caffeine pills for this, but has not taken anything at today. This is not the worst headache she ever had. No associated fevers chills. She denies having a stiff neck. No confusion.  Patient does report that she's been having a aching pain also over the left elbow. She's not had any injury or noticed any swelling fevers chills area no numbness or tingling. She reports that sometimes she thinks the pain may come from the neck and shoot down towards the left elbow. She has a previous history of bursitis in the left shoulder but states this does not seem the same. She did have some nausea last night which is better. Loud noises, TV, bright lights do not seem to affect her headache. She reports it to be mild to moderate in intensity. Slightly better now than it was last evening. It was not sudden or maximal in onset.  Denies any chest pain or trouble breathing. Last menstrual periods about 2 weeks ago. She did drive here today, and does not wish to receive any medications that might make her sleepy if she were to drive home.  No facial droop. No trouble walking. No dizziness.  Past Medical History  Diagnosis Date  . Hypertension   . GERD (gastroesophageal reflux disease)   . Hyperlipemia     There are no active problems to display for this  patient.   History reviewed. No pertinent past surgical history.  Current Outpatient Rx  Name  Route  Sig  Dispense  Refill  . metoprolol succinate (TOPROL-XL) 25 MG 24 hr tablet   Oral   Take 25 mg by mouth daily.         Marland Kitchen omeprazole (PRILOSEC) 40 MG capsule   Oral   Take 40 mg by mouth daily as needed (for acid reflux).          . butalbital-acetaminophen-caffeine (FIORICET) 50-325-40 MG tablet   Oral   Take 1-2 tablets by mouth every 6 (six) hours as needed for headache.   20 tablet   0     Allergies Review of patient's allergies indicates no known allergies.  No family history on file.  Social History Social History  Substance Use Topics  . Smoking status: Never Smoker   . Smokeless tobacco: None  . Alcohol Use: No    Review of Systems Constitutional: No fever/chills Eyes: No visual changes. ENT: No sore throat. Cardiovascular: Denies chest pain. Respiratory: Denies shortness of breath. Gastrointestinal: No abdominal pain.  No nausea, no vomiting.  No diarrhea.  No constipation. Genitourinary: Negative for dysuria. Musculoskeletal: Negative for back pain. Skin: Negative for rash. Neurological: Negative for headaches, focal weakness or numbness.  10-point ROS otherwise negative.  ____________________________________________   PHYSICAL EXAM:  VITAL SIGNS: ED Triage Vitals  Enc Vitals Group     BP 11/20/15 0753 150/98 mmHg  Pulse Rate 11/20/15 0753 97     Resp 11/20/15 0753 18     Temp 11/20/15 0753 97.8 F (36.6 C)     Temp Source 11/20/15 0753 Oral     SpO2 11/20/15 0753 98 %     Weight 11/20/15 0753 210 lb (95.255 kg)     Height 11/20/15 0753 5\' 8"  (1.727 m)     Head Cir --      Peak Flow --      Pain Score 11/20/15 0753 7     Pain Loc --      Pain Edu? --      Excl. in GC? --    Constitutional: Alert and oriented. Well appearing and in no acute distress. Eyes: Conjunctivae are normal. PERRL. EOMI. Head: Atraumatic. Nose: No  congestion/rhinnorhea. Mouth/Throat: Mucous membranes are moist.  Oropharynx non-erythematous. Neck: No stridor.  No meningismus. Patient does have some muscle spasm and tenderness along the left trapezius. Cardiovascular: Normal rate, regular rhythm. Grossly normal heart sounds.  Good peripheral circulation. Respiratory: Normal respiratory effort.  No retractions. Lungs CTAB. Gastrointestinal: Soft and nontender. No distention. No abdominal bruits. No CVA tenderness. Musculoskeletal:   Right hand Median, ulnar, radial motor intact. Cap refill less than 2 seconds all digits. Strong radial pulse. 5 out of 5 strength throughout the hand intrinsics, flexion and extension at the wrist. No evidence of trauma.  Left hand Median, ulnar, radial motor intact. Cap refill less than 2 seconds all digits. Strong radial pulse. 5 out of 5 strength throughout the hand intrinsics, flexion and extension at the wrist. No evidence of trauma. Full range of motion at the left elbow and left shoulder without pain or tenderness. Patient currently reports that she is really not having any aching in the left arm, was worse yesterday. ____________________________________________   No lower extremity tenderness nor edema.  No joint effusions. Neurologic:  Normal speech and language. No gross focal neurologic deficits are appreciated. No gait instability. Cranial nerve exam is normal. No pronator drift. 5 out of 5 strength in all extremities. No sensory deficits. Skin:  Skin is warm, dry and intact. No rash noted. Psychiatric: Mood and affect are normal. Speech and behavior are normal.  ____________________________________________   LABS (all labs ordered are listed, but only abnormal results are displayed)  Labs Reviewed  BASIC METABOLIC PANEL - Abnormal; Notable for the following:    Sodium 131 (*)    Potassium 3.4 (*)    Calcium 8.5 (*)    All other components within normal limits  CBC - Abnormal; Notable for  the following:    MCV 79.6 (*)    All other components within normal limits  TROPONIN I   ____________________________________________  EKG  ED ECG REPORT I, Verna Hamon, the attending physician, personally viewed and interpreted this ECG.  Date: 11/20/2015 EKG Time: 8 AM Rate: 99 Rhythm: normal sinus rhythm QRS Axis: normal Intervals: normal ST/T Wave abnormalities: normal Conduction Disutrbances: none Narrative Interpretation: unremarkable  ____________________________________________  RADIOLOGY  Presently no indication for emergent imaging of the brain. She is neurologically intact, not the worst headache of her life, was not maximal in onset, not associated photophobia other concerning symptoms at this time. ____________________________________________   PROCEDURES  Procedure(s) performed: None  Critical Care performed: No  ____________________________________________   INITIAL IMPRESSION / ASSESSMENT AND PLAN / ED COURSE  Pertinent labs & imaging results that were available during my care of the patient were reviewed by me and considered in my  medical decision making (see chart for details).  Patient presents for evaluation of headache and left arm aching. Overall very reassuring exam and EKG. I will obtain a single troponin, as her symptoms and ongoing for about 2-3 days her expect history positive if there is any frank coronary disease although her symptoms are very atypical. I suspect most likely that she is having some sort of a tension or occipital type of headache with possibly some muscle spasm along the left trapezius causing some nerve irritation radiating down the left arm at times. Motor and neuro and vascular intact in the left upper extremity. Overall very reassuring exam. We'll treat with NSAIDs and Tylenol here, possible prescription for discharge the patient does not wish to receive anything that would make her tired for drive home.  No evidence of  intracranial hemorrhage, stroke, meningitis, carotid dissection, etc.   ----------------------------------------- 9:30 AM on 11/20/2015 -----------------------------------------  Patient reports that her symptoms are resolved. She feels much better. She is awake alert in no distress. Discussed follow-up plan and careful return precautions also prescription given.  Return precautions and treatment recommendations and follow-up discussed with the patient who is agreeable with the plan.  ____________________________________________   FINAL CLINICAL IMPRESSION(S) / ED DIAGNOSES  Final diagnoses:  Episodic tension-type headache, not intractable      Sharyn CreamerMark Deveron Shamoon, MD 11/20/15 0930

## 2015-11-20 NOTE — ED Notes (Signed)
Pt c/o left arm "aching" for the past 2-3 days with a HA with nausea that started last night

## 2016-04-11 IMAGING — CT CT HEAD WITHOUT CONTRAST
1 series · 16 of 30 positions shown, 20 images · non-contrast
Comparison: None

CLINICAL DATA: Acute headache for 1 day, dizziness

EXAM:
CT HEAD WITHOUT CONTRAST
TECHNIQUE: Contiguous axial images were obtained from the base of the skull
through the vertex without contrast.

[Series 2: head wo · axial · 0.46mm/px · z∈[-152,-26]mm · 16 of 32 slices shown, 20 images]
[im 2/32  brain]
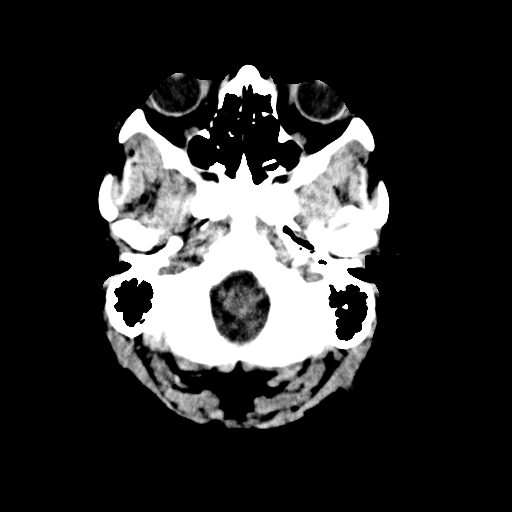
[im 2/32  bone]
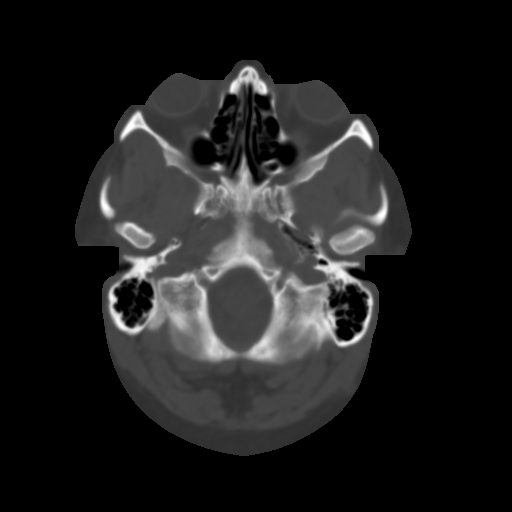
[im 4/32  brain]
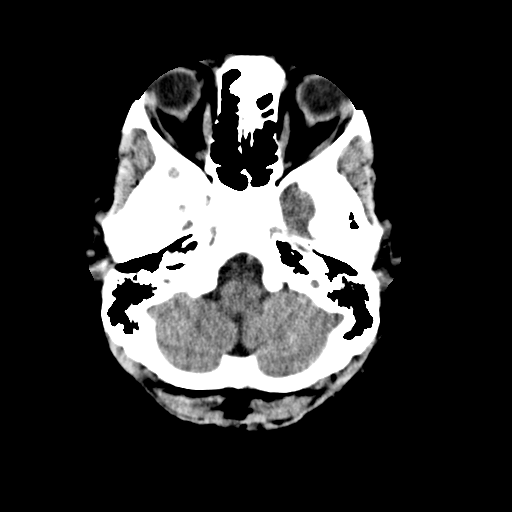
[im 6/32  brain]
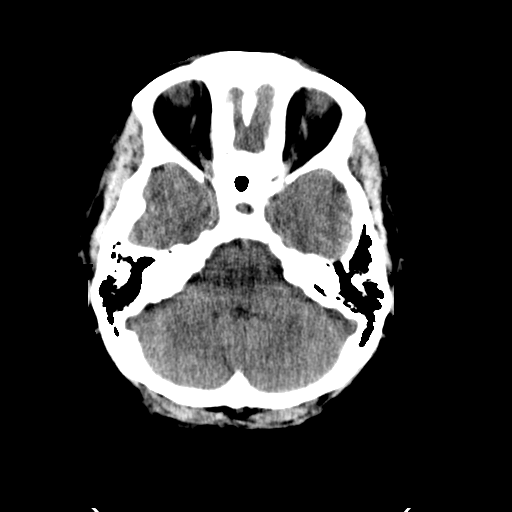
[im 8/32  brain]
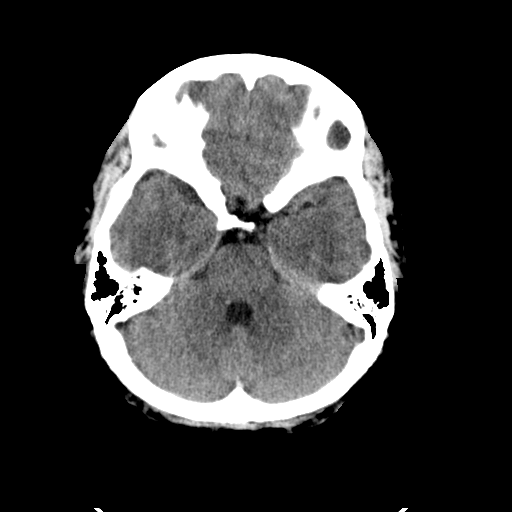
[im 9/32  brain]
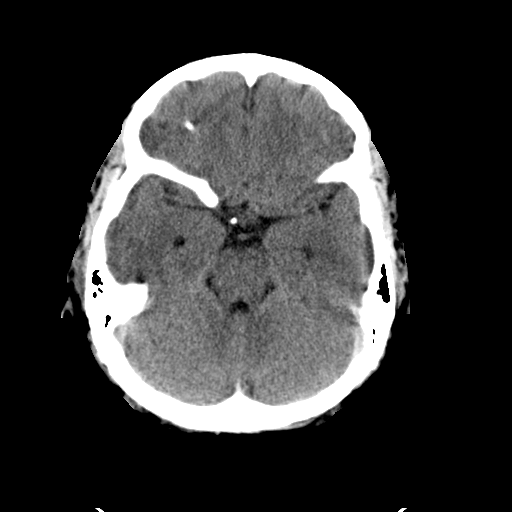
[im 9/32  bone]
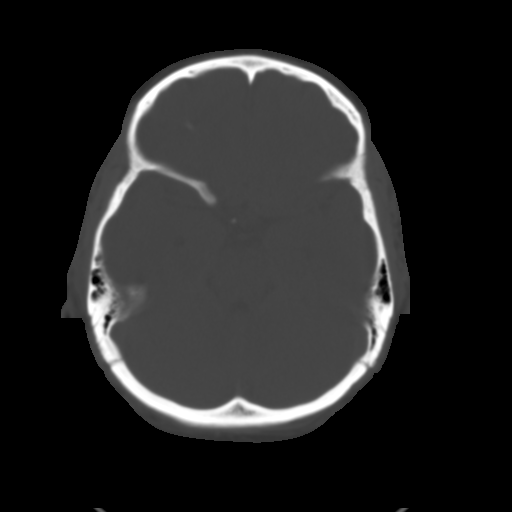
[im 11/32  brain]
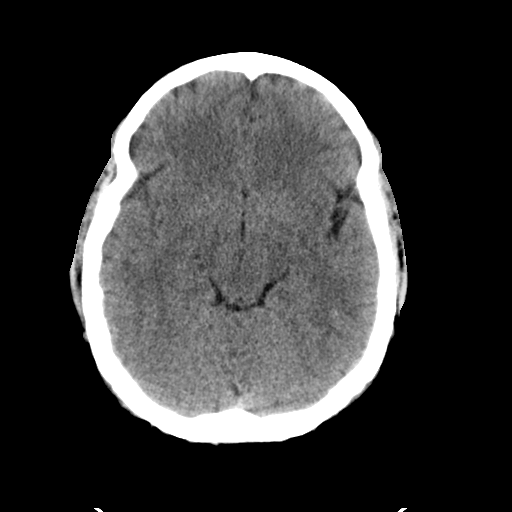
[im 13/32  brain]
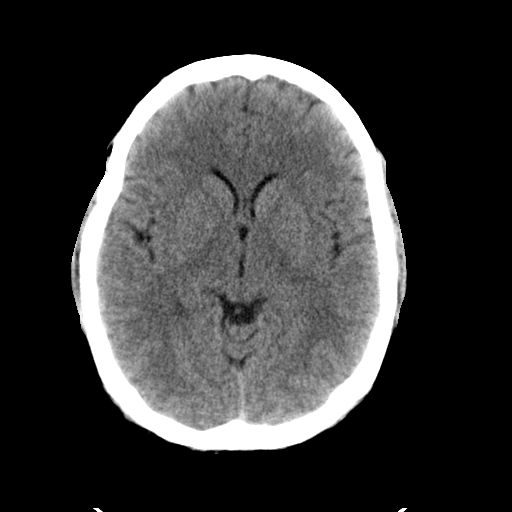
[im 15/32  brain]
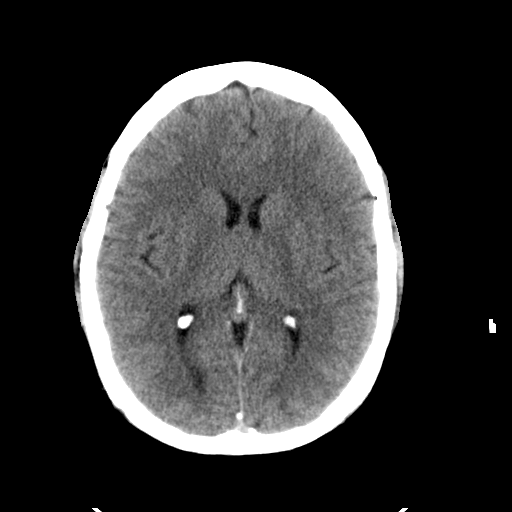
[im 17/32  brain]
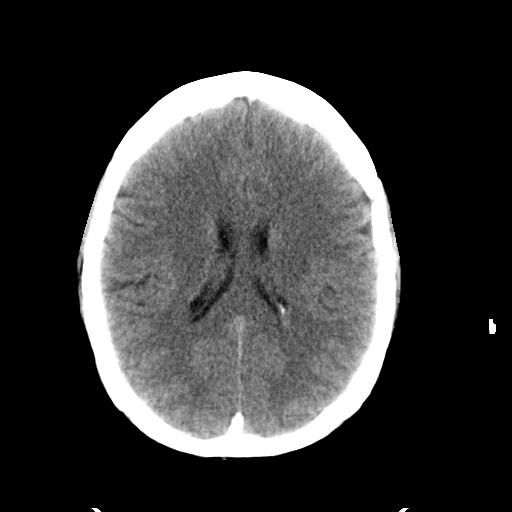
[im 17/32  bone]
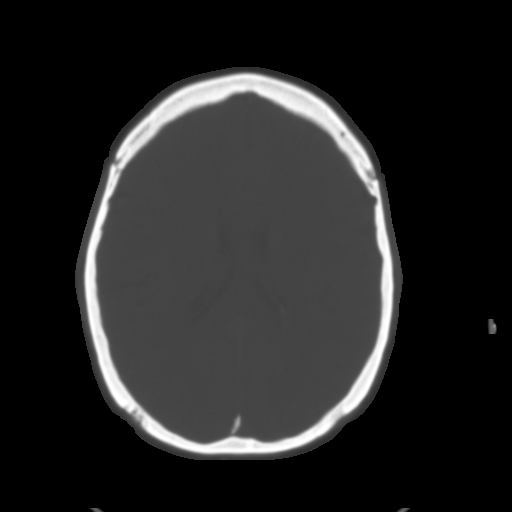
[im 19/32  brain]
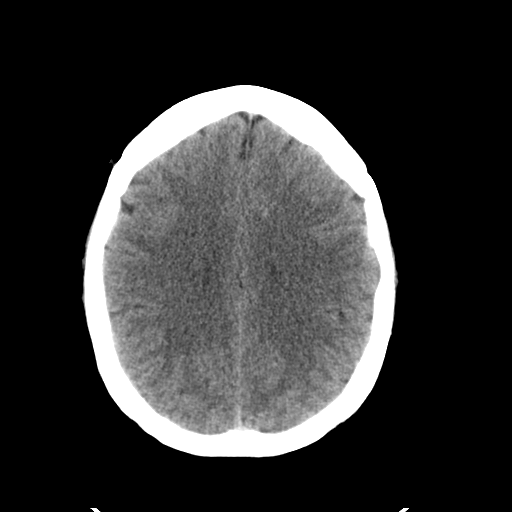
[im 21/32  brain]
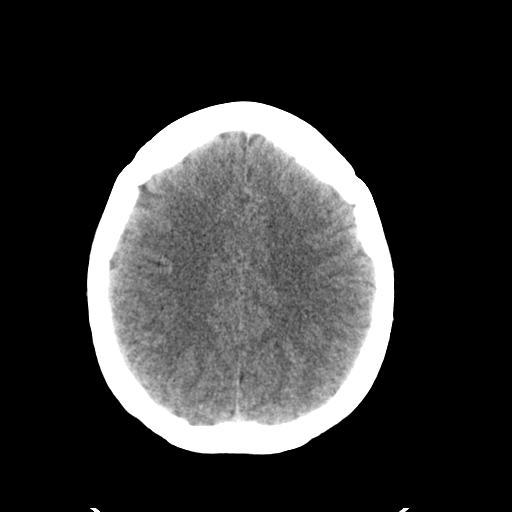
[im 23/32  brain]
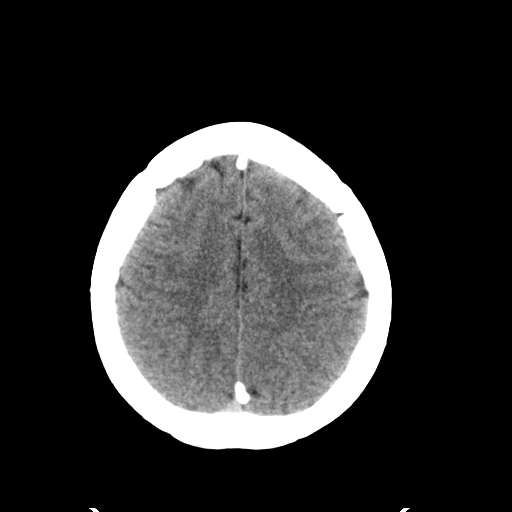
[im 24/32  brain]
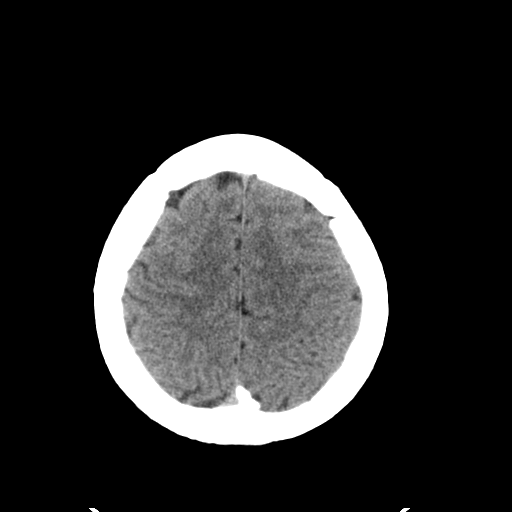
[im 24/32  bone]
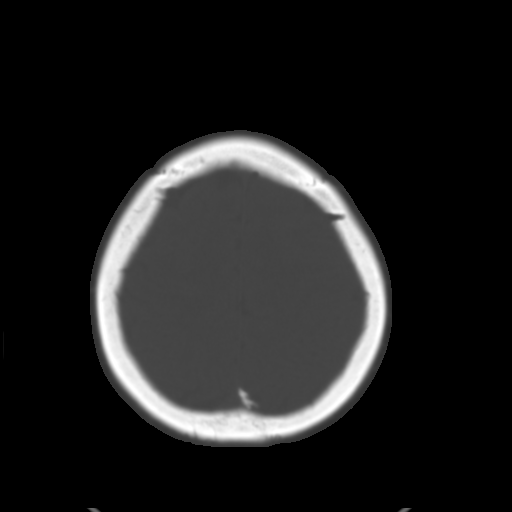
[im 26/32  brain]
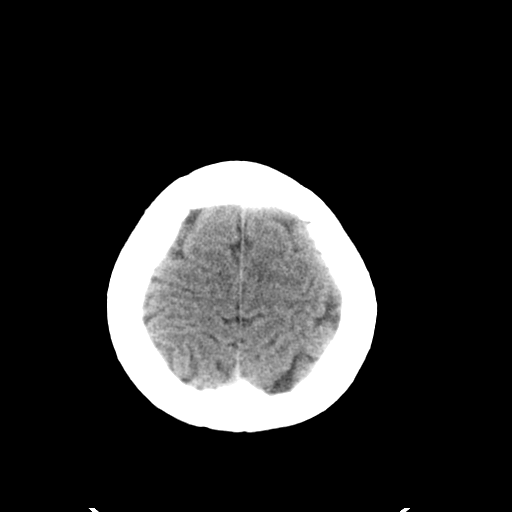
[im 28/32  brain]
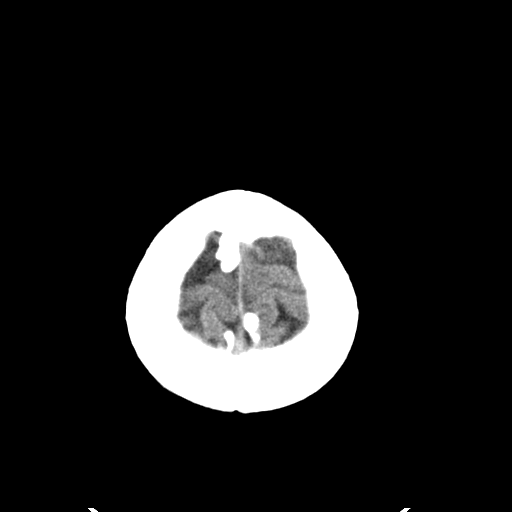
[im 30/32  brain]
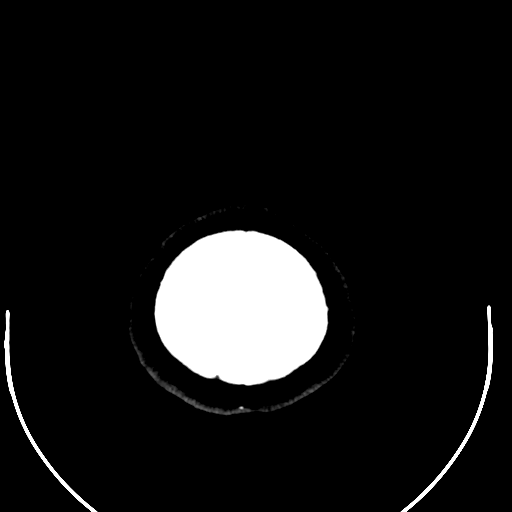

[16 of 30 positions shown; findings below may reference images not displayed]

FINDINGS: Normal appearance of the intracranial structures. No evidence for
acute hemorrhage, mass lesion, midline shift, hydrocephalus or large
infarct. No acute bony abnormality. The visualized sinuses are
clear.
IMPRESSION: No acute intracranial abnormality.

## 2017-08-25 ENCOUNTER — Encounter: Payer: Self-pay | Admitting: Emergency Medicine

## 2017-08-25 ENCOUNTER — Emergency Department
Admission: EM | Admit: 2017-08-25 | Discharge: 2017-08-25 | Disposition: A | Discharge status: Home / Self Care | Payer: Self-pay | Attending: Emergency Medicine | Admitting: Emergency Medicine

## 2017-08-25 DIAGNOSIS — I10 Essential (primary) hypertension: Secondary | ICD-10-CM | POA: Insufficient documentation

## 2017-08-25 DIAGNOSIS — Z79899 Other long term (current) drug therapy: Secondary | ICD-10-CM | POA: Insufficient documentation

## 2017-08-25 DIAGNOSIS — L0231 Cutaneous abscess of buttock: Secondary | ICD-10-CM | POA: Insufficient documentation

## 2017-08-25 MED ORDER — NAPROXEN 500 MG PO TABS: 500.0000 mg | ORAL_TABLET | Freq: Two times a day (BID) | ORAL | 0 refills | Status: AC

## 2017-08-25 NOTE — ED Provider Notes (Signed)
[facility] Emergency Department Provider Note  ___________________________________________   First MD Initiated Contact with Patient 08/25/17 [phone]     (approximate)  I have reviewed the triage vital signs and the nursing notes.   HISTORY  Chief Complaint Recurrent Skin Infections   HPI Alice Stephens is a 46 y.o. female is here with questionable abscess to her left hip area. Patient states that she has had "boils" since age 46. She denies any fever or chills. She recently had one that opened on its own and drained and is concerned that this is a continuation of bowel episode. She denies any fever or chills. There's been no nausea or vomiting.currently she rates her pain as 2/10.   Past Medical History:  Diagnosis Date  . GERD (gastroesophageal reflux disease)   . Hyperlipemia   . Hypertension     There are no active problems to display for this patient.   History reviewed. No pertinent surgical history.  Prior to Admission medications   Medication Sig Start Date End Date Taking? Authorizing Provider  metoprolol succinate (TOPROL-XL) 25 MG 24 hr tablet Take 25 mg by mouth daily.    [provider]  naproxen (NAPROSYN) 500 MG tablet Take 1 tablet (500 mg total) by mouth 2 (two) times daily with a meal. 08/25/17   Tommi RumpsSummers, Rhonda L, PA-C  omeprazole (PRILOSEC) 40 MG capsule Take 40 mg by mouth daily as needed (for acid reflux).     [provider]    Allergies Patient has no known allergies.  No family history on file.  Social History Social History  Substance Use Topics  . Smoking status: Never Smoker  . Smokeless tobacco: Not on file  . Alcohol use No    Review of Systems Constitutional: No fever/chills Cardiovascular: Denies chest pain. Respiratory: Denies shortness of breath. Musculoskeletal: Negative for back pain. Skin: positive for abscess. Neurological: Negative for headaches, focal weakness or  numbness. ___________________________________________   PHYSICAL EXAM:  VITAL SIGNS: ED Triage Vitals  Enc Vitals Group     BP 08/25/17 0758 (!) 176/94     Pulse Rate 08/25/17 0758 99     Resp 08/25/17 0758 16     Temp 08/25/17 0758 98.5 F (36.9 C)     Temp Source 08/25/17 0758 Oral     SpO2 08/25/17 0758 99 %     Weight 08/25/17 0759 210 lb (95.3 kg)     Height 08/25/17 0759 5\' 8"  (1.727 m)     Head Circumference --      Peak Flow --      Pain Score 08/25/17 0757 2     Pain Loc --      Pain Edu? --      Excl. in GC? --    Constitutional: Alert and oriented. Well appearing and in no acute distress. Eyes: Conjunctivae are normal.  Head: Atraumatic. Neck: No stridor.   Cardiovascular: Normal rate, regular rhythm. Grossly normal heart sounds.  Good peripheral circulation. Respiratory: Normal respiratory effort.  No retractions. Lungs CTAB. Musculoskeletal: Ms. Patton SallesUpper and lower extremities without difficulty and normal gait was noted. Neurologic:  Normal speech and language. No gross focal neurologic deficits are appreciated.  Skin:  Skin is warm, dry and intact. There is evidence of an old resolving abscess that appears to be superficial. Area is scaly and healing over. No active drainage is noted. Area in question is not an abscess but most likely a lipoma on the left hip area.  There is no tenderness, erythema, warmth present. Psychiatric: Mood and affect are normal. Speech and behavior are normal.  ____________________________________________   LABS (all labs ordered are listed, but only abnormal results are displayed)  Labs Reviewed - No data to display   PROCEDURES  Procedure(s) performed: None  Procedures  Critical Care performed: No  ____________________________________________   INITIAL IMPRESSION / ASSESSMENT AND PLAN / ED COURSE  patient was reassured that the area on her hip is not a abscess. She was reassured that the superficial abscess on her  buttocks is resolving without any continued drainage.   ___________________________________________   FINAL CLINICAL IMPRESSION(S) / ED DIAGNOSES  Final diagnoses:  Cutaneous abscess of buttock      NEW MEDICATIONS STARTED DURING THIS VISIT:  Discharge Medication List as of 08/25/2017  8:47 AM    START taking these medications   Details  naproxen (NAPROSYN) 500 MG tablet Take 1 tablet (500 mg total) by mouth 2 (two) times daily with a meal., Starting Fri 08/25/2017, Print         Note:  This document was prepared using Dragon voice recognition software and may include unintentional dictation errors.    Tommi Rumps, PA-C 08/25/17 1733    Minna Antis, MD 08/28/17 1549

## 2017-08-25 NOTE — ED Triage Notes (Signed)
Hx of "boils" since age 46 "boils and staph and stuff like that".  Left hip - hard raised area and buttocks.

## 2017-08-25 NOTE — Discharge Instructions (Signed)
[facility] if any continued problems. Begin taking naproxen 500 mg twice a day with food. Continue using warm compresses to the abscess area and watch the area for infection.

## 2017-08-25 NOTE — ED Notes (Signed)
NAD noted at time of D/C. Pt denies questions or concerns. Pt ambulatory to the lobby at this time.  

## 2017-08-25 NOTE — ED Notes (Signed)
Patient states "It seems to have started with a boil centered over the crack between my cheeks, that drained and now my left hip appears swollen and painful, pain is when pressure aapplied"

## 2019-04-16 ENCOUNTER — Encounter: Admit: 2019-04-16 | Discharge: 2019-04-17 | Payer: PRIVATE HEALTH INSURANCE | Attending: Family | Primary: Family

## 2019-04-16 DIAGNOSIS — L989 Disorder of the skin and subcutaneous tissue, unspecified: Secondary | ICD-10-CM

## 2019-04-16 DIAGNOSIS — E785 Hyperlipidemia, unspecified: Secondary | ICD-10-CM

## 2019-04-16 DIAGNOSIS — I10 Essential (primary) hypertension: Principal | ICD-10-CM

## 2019-05-20 MED ORDER — METOPROLOL SUCCINATE ER 25 MG TABLET,EXTENDED RELEASE 24 HR
ORAL_TABLET | Freq: Every day | ORAL | 1 refills | 90.00000 days | Status: CP
Start: 2019-05-20 — End: 2019-06-04

## 2019-06-04 ENCOUNTER — Encounter: Admit: 2019-06-04 | Discharge: 2019-06-05 | Payer: PRIVATE HEALTH INSURANCE | Attending: Family | Primary: Family

## 2019-06-04 DIAGNOSIS — I10 Essential (primary) hypertension: Principal | ICD-10-CM

## 2019-06-04 MED ORDER — METOPROLOL SUCCINATE ER 25 MG TABLET,EXTENDED RELEASE 24 HR
ORAL_TABLET | Freq: Two times a day (BID) | ORAL | 3 refills | 90 days | Status: CP
Start: 2019-06-04 — End: 2020-06-03

## 2019-06-18 ENCOUNTER — Encounter: Admit: 2019-06-18 | Discharge: 2019-06-19 | Payer: PRIVATE HEALTH INSURANCE

## 2019-06-18 DIAGNOSIS — L732 Hidradenitis suppurativa: Secondary | ICD-10-CM

## 2019-06-18 DIAGNOSIS — L989 Disorder of the skin and subcutaneous tissue, unspecified: Principal | ICD-10-CM

## 2019-06-18 MED ORDER — DOXYCYCLINE HYCLATE 100 MG TABLET
ORAL_TABLET | 3 refills | 0 days | Status: CP
Start: 2019-06-18 — End: ?

## 2019-08-16 ENCOUNTER — Encounter: Admit: 2019-08-16 | Discharge: 2019-08-17 | Payer: PRIVATE HEALTH INSURANCE | Attending: Family | Primary: Family

## 2019-08-16 DIAGNOSIS — E785 Hyperlipidemia, unspecified: Secondary | ICD-10-CM

## 2019-08-16 DIAGNOSIS — K219 Gastro-esophageal reflux disease without esophagitis: Secondary | ICD-10-CM

## 2019-08-16 DIAGNOSIS — I10 Essential (primary) hypertension: Secondary | ICD-10-CM

## 2019-08-16 MED ORDER — HYDROCHLOROTHIAZIDE 25 MG TABLET
ORAL_TABLET | Freq: Every day | ORAL | 3 refills | 90 days | Status: CP
Start: 2019-08-16 — End: ?

## 2020-02-04 ENCOUNTER — Encounter: Admit: 2020-02-04 | Discharge: 2020-02-05 | Payer: PRIVATE HEALTH INSURANCE | Attending: Family | Primary: Family

## 2020-02-04 DIAGNOSIS — J3089 Other allergic rhinitis: Principal | ICD-10-CM

## 2020-02-04 DIAGNOSIS — I10 Essential (primary) hypertension: Principal | ICD-10-CM

## 2020-02-04 DIAGNOSIS — E782 Mixed hyperlipidemia: Principal | ICD-10-CM

## 2020-02-04 MED ORDER — FLUTICASONE PROPIONATE 50 MCG/ACTUATION NASAL SPRAY,SUSPENSION
Freq: Once | NASAL | 2 refills | 0 days | Status: CP | PRN
Start: 2020-02-04 — End: ?

## 2020-04-18 DIAGNOSIS — L732 Hidradenitis suppurativa: Principal | ICD-10-CM

## 2020-04-20 DIAGNOSIS — L732 Hidradenitis suppurativa: Principal | ICD-10-CM

## 2020-04-20 MED ORDER — DOXYCYCLINE HYCLATE 100 MG TABLET
ORAL_TABLET | 0 refills | 0 days | Status: CP
Start: 2020-04-20 — End: ?

## 2020-04-30 ENCOUNTER — Ambulatory Visit: Admit: 2020-04-30 | Discharge: 2020-05-01 | Payer: PRIVATE HEALTH INSURANCE

## 2020-04-30 DIAGNOSIS — L732 Hidradenitis suppurativa: Principal | ICD-10-CM

## 2020-04-30 DIAGNOSIS — Z79899 Other long term (current) drug therapy: Principal | ICD-10-CM

## 2020-04-30 MED ORDER — HUMIRA PEN CITRATE FREE 40 MG/0.4 ML
SUBCUTANEOUS | 11 refills | 28.00000 days | Status: CP
Start: 2020-04-30 — End: ?
  Filled 2020-05-19: qty 3, 28d supply, fill #0

## 2020-04-30 MED ORDER — SPIRONOLACTONE 100 MG TABLET
ORAL_TABLET | Freq: Every day | ORAL | 11 refills | 30.00000 days | Status: CP
Start: 2020-04-30 — End: 2021-04-30

## 2020-04-30 MED ORDER — HUMIRA PEN CITRATE FREE STARTER PACK FOR CROHN'S/UC/HS 3 X 80 MG/0.8 ML
PACK | 0 refills | 0 days | Status: CP
Start: 2020-04-30 — End: ?

## 2020-05-01 DIAGNOSIS — L732 Hidradenitis suppurativa: Principal | ICD-10-CM

## 2020-05-13 DIAGNOSIS — I10 Essential (primary) hypertension: Principal | ICD-10-CM

## 2020-05-13 DIAGNOSIS — H1132 Conjunctival hemorrhage, left eye: Principal | ICD-10-CM

## 2020-05-14 NOTE — Unmapped (Signed)
[facility] Specialty Medication Onboarding    Specialty Medication: Humira (CF) Starter kit and 40mg  every week pens maintenance dose  Prior Authorization: Approved   Financial Assistance: Yes - copay card approved as secondary   Final Copay/Day Supply: $5 each / 28 days each    Insurance Restrictions: Yes - max 1 month supply     Notes to Pharmacist:     The triage team has completed the benefits investigation and has determined that the patient is able to fill this medication at [facility]. Please contact the patient to complete the onboarding or follow up with the prescribing physician as needed.

## 2020-05-15 MED ORDER — EMPTY CONTAINER
2 refills | 0 days
Start: 2020-05-15 — End: ?

## 2020-05-15 NOTE — Unmapped (Signed)
*in addition to reviewing the information below, I also sent a link to an injection training video to the patient's MyChart.    [facility]   Patient Onboarding/Medication Counseling    Heather Watts is a 49 y.o. female with hidradenitis who I am counseling today on initiation of therapy.  I am speaking to the patient.    Was a Nurse, learning disability used for this call? No    Verified patient's date of birth / HIPAA.    Specialty medication(s) to be sent: Inflammatory Disorders: Humira      Non-specialty medications/supplies to be sent: sharps kit      Medications not needed at this time: na         Humira (adalimumab)    Medication & Administration     Dosage: Hidradenitis supprativa: Inject 160mg  under the skin on day 1, 80mg  on day 15, then 40mg  every 7 days starting on day 29    Lab tests required prior to treatment initiation:  ??? Tuberculosis: Tuberculosis screening resulted in a non-reactive Quantiferon TB Gold assay.  ??? Hepatitis B: Hepatitis B serology studies are complete and non-reactive.    Administration:     Prefilled auto-injector pen  1. Gather all supplies needed for injection on a clean, flat working surface: medication pen removed from packaging, alcohol swab, sharps container, etc.  2. Look at the medication label - look for correct medication, correct dose, and check the expiration date  3. Look at the medication - the liquid visible in the window on the side of the pen device should appear clear and colorless  4. Lay the auto-injector pen on a flat surface and allow it to warm up to room temperature for at least 30-45 minutes  5. Select injection site - you can use the front of your thigh or your belly (but not the area 2 inches around your belly button); if someone else is giving you the injection you can also use your upper arm in the skin covering your triceps muscle  6. Prepare injection site - wash your hands and clean the skin at the injection site with an alcohol swab and let it air dry, do not touch the injection site again before the injection  7. Pull the 2 safety caps straight off - gray/white to uncover the needle cover and the plum cap to uncover the plum activator button, do not remove until immediately prior to injection and do not touch the white needle cover  8. Gently squeeze the area of cleaned skin and hold it firmly to create a firm surface at the selected injection site  9. Put the white needle cover against your skin at the injection site at a 90 degree angle, hold the pen such that you can see the clear medication window  10. Press down and hold the pen firmly against your skin, press the plum activator button to initiate the injection, there will be a click when the injection starts  11. Continue to hold the pen firmly against your skin for about 10-15 seconds - the window will start to turn solid yellow  12. To verify the injection is complete after 10-15 seconds, look and ensure the window is solid yellow and then pull the pen away from your skin  13. Dispose of the used auto-injector pen immediately in your sharps disposal container the needle will be covered automatically  14. If you see any blood at the injection site, press a cotton ball or gauze on  the site and maintain pressure until the bleeding stops, do not rub the injection site    Adherence/Missed dose instructions:  If your injection is given more than 3 days after your scheduled injection date ??? consult your pharmacist for additional instructions on how to adjust your dosing schedule.    Goals of Therapy     - Reduce the frequency and severity of new lesions  - Minimize pain and suppuration  - Prevent disease progression and limit scarring  - Maintenance of effective psychosocial functioning    Side Effects & Monitoring Parameters     ??? Injection site reaction (redness, irritation, inflammation localized to the site of administration)  ??? Signs of a common cold ??? minor sore throat, runny or stuffy nose, etc. ??? Upset stomach  ??? Headache    The following side effects should be reported to the provider:  ??? Signs of a hypersensitivity reaction ??? rash; hives; itching; red, swollen, blistered, or peeling skin; wheezing; tightness in the chest or throat; difficulty breathing, swallowing, or talking; swelling of the mouth, face, lips, tongue, or throat; etc.  ??? Reduced immune function ??? report signs of infection such as fever; chills; body aches; very bad sore throat; ear or sinus pain; cough; more sputum or change in color of sputum; pain with passing urine; wound that will not heal, etc.  Also at a slightly higher risk of some malignancies (mainly skin and blood cancers) due to this reduced immune function.  o In the case of signs of infection ??? the patient should hold the next dose of Humira?? and call your primary care provider to ensure adequate medical care.  Treatment may be resumed when infection is treated and patient is asymptomatic.  ??? Changes in skin ??? a new growth or lump that forms; changes in shape, size, or color of a previous mole or marking  ??? Signs of unexplained bruising or bleeding ??? throwing up blood or emesis that looks like coffee grounds; black, tarry, or bloody stool; etc.  ??? Signs of new or worsening heart failure ??? shortness of breath; sudden weight gain; heartbeat that is not normal; swelling in the arms or legs that is new or worse      Contraindications, Warnings, & Precautions     ??? Have your bloodwork checked as you have been told by your prescriber  ??? Talk with your doctor if you are pregnant, planning to become pregnant, or breastfeeding  ??? Discuss the possible need for holding your dose(s) of Humira?? when a planned procedure is scheduled with the prescriber as it may delay healing/recovery timeline       Drug/Food Interactions     ??? Medication list reviewed in Epic. The patient was instructed to inform the care team before taking any new medications or supplements. No drug interactions identified.   ??? Talk with you prescriber or pharmacist before receiving any live vaccinations while taking this medication and after you stop taking it    Storage, Handling Precautions, & Disposal     ??? Store this medication in the refrigerator.  Do not freeze  ??? If needed, you may store at room temperature for up to 14 days  ??? Store in original packaging, protected from light  ??? Do not shake  ??? Dispose of used syringes/pens in a sharps disposal container            Current Medications (including OTC/herbals), Comorbidities and Allergies     Current Outpatient Medications   Medication  Sig Dispense Refill   ??? doxycycline (VIBRA-TABS) 100 MG tablet Take one tablet by mouth twice daily. (Patient not taking: Reported on 05/13/2020) 60 tablet 0   ??? fluticasone propionate (FLONASE) 50 mcg/actuation nasal spray 1 spray by Each Nare route once as needed for rhinitis. 16 g 2   ??? HUMIRA PEN CITRATE FREE 40 MG/0.4 ML Inject the contents of 1 pen (40 mg total) under the skin every seven (7) days. (Patient not taking: Reported on 05/13/2020) 4 each 11   ??? HUMIRA PEN CITRATE FREE STARTER PACK FOR CROHN'S/UC/HS 3 X 80 MG/0.8 ML Inject the contents of 2 pens (160mg ) under the skin on day 1, then inject the contents of 1 pen (80mg ) on day 15. (Patient not taking: Reported on 05/13/2020) 3 each 0   ??? hydroCHLOROthiazide (HYDRODIURIL) 25 MG tablet Take 1 tablet (25 mg total) by mouth daily. 90 tablet 3   ??? metoprolol succinate (TOPROL XL) 25 MG 24 hr tablet Take 1 tablet (25 mg total) by mouth two (2) times a day. 180 tablet 3   ??? omeprazole (PRILOSEC) 40 MG capsule Take 40 mg by mouth daily.     ??? spironolactone (ALDACTONE) 100 MG tablet Take 1 tablet (100 mg total) by mouth daily. 30 tablet 11     No current facility-administered medications for this visit.       No Known Allergies    Patient Active Problem List   Diagnosis   ??? Essential hypertension   ??? Hyperlipidemia   ??? Grouped skin lesions       Reviewed and up to date in Epic. Appropriateness of Therapy     Is medication and dose appropriate based on diagnosis? Yes    Prescription has been clinically reviewed: Yes    Baseline Quality of Life Assessment      How many days over the past month did your hs  keep you from your normal activities? For example, brushing your teeth or getting up in the morning. daily, has had severe flares    Financial Information     Medication Assistance provided: Prior Authorization and Copay Assistance    Anticipated copay of $5 reviewed with patient. Verified delivery address.    Delivery Information     Scheduled delivery date: Tues, July 13    Expected start date: Tues, July 13    Medication will be delivered via Same Day Courier to the prescription address in [place].  This shipment will not require a signature.      Explained the services we provide at [facility] and that each month we would call to set up refills.  Stressed importance of returning phone calls so that we could ensure they receive their medications in time each month.  Informed patient that we should be setting up refills 7-10 days prior to when they will run out of medication.  A pharmacist will reach out to perform a clinical assessment periodically.  Informed patient that a welcome packet and a drug information handout will be sent.      Patient verbalized understanding of the above information as well as how to contact the pharmacy at [phone] option 4 with any questions/concerns.  The pharmacy is open Monday through Friday 8:30am-4:30pm.  A pharmacist is available 24/7 via pager to answer any clinical questions they may have.    Patient Specific Needs     - Does the patient have any physical, cognitive, or cultural barriers? No    -  Patient prefers to have medications discussed with  Patient     - Is the patient or caregiver able to read and understand education materials at a high school level or above? Yes    - Patient's primary language is  English - Is the patient high risk? No     - Does the patient require a Care Management Plan? No     - Does the patient require physician intervention or other additional services (i.e. nutrition, smoking cessation, social work)? No      Manasvi Dickard A Desiree Lucy [facility] Specialty Pharmacist

## 2020-05-18 MED ORDER — VALACYCLOVIR 500 MG TABLET
ORAL_TABLET | Freq: Two times a day (BID) | ORAL | 1 refills | 15.00000 days | Status: CP
Start: 2020-05-18 — End: 2020-05-21

## 2020-05-19 MED FILL — EMPTY CONTAINER: 120 days supply | Qty: 1 | Fill #0 | Status: AC

## 2020-05-19 MED FILL — HUMIRA PEN CITRATE FREE STARTER PACK FOR CROHN'S/UC/HS 3 X 80 MG/0.8 ML: 28 days supply | Qty: 3 | Fill #0 | Status: AC

## 2020-05-19 MED FILL — EMPTY CONTAINER: 120 days supply | Qty: 1 | Fill #0

## 2020-06-05 NOTE — Unmapped (Signed)
Mora reports things are going well with her new Humira. She has had no new flares, and her existing flares are closing up. She's happy with progress so far. She's had some minor injection site itching - we discussed possibility of topical steroid or antihistamine, but she declined anything prescription at this time, and will let us know if this worsens.    Maintenance begins on 8/11.     [facility] Clinical Assessment & Refill Coordination Note    Deysi Soldo, DOB: [birth year]  Phone: [phone] (home)     All above HIPAA information was verified with patient.     Was a Nurse, learning disability used for this call? No    Specialty Medication(s):   Inflammatory Disorders: Humira     Current Outpatient Medications   Medication Sig Dispense Refill   ??? doxycycline (VIBRA-TABS) 100 MG tablet Take one tablet by mouth twice daily. (Patient not taking: Reported on 05/13/2020) 60 tablet 0   ??? empty container Misc Use as directed to dispose of Humira pens. 1 each 2   ??? fluticasone propionate (FLONASE) 50 mcg/actuation nasal spray 1 spray by Each Nare route once as needed for rhinitis. 16 g 2   ??? HUMIRA PEN CITRATE FREE 40 MG/0.4 ML Inject the contents of 1 pen (40 mg total) under the skin every seven (7) days. (Patient not taking: Reported on 05/13/2020) 4 each 11   ??? HUMIRA PEN CITRATE FREE STARTER PACK FOR CROHN'S/UC/HS 3 X 80 MG/0.8 ML Inject the contents of 2 pens (160mg ) under the skin on day 1, then inject the contents of 1 pen (80mg ) on day 15. (Patient not taking: Reported on 05/13/2020) 3 each 0   ??? hydroCHLOROthiazide (HYDRODIURIL) 25 MG tablet Take 1 tablet (25 mg total) by mouth daily. 90 tablet 3   ??? metoprolol succinate (TOPROL XL) 25 MG 24 hr tablet Take 1 tablet (25 mg total) by mouth two (2) times a day. 180 tablet 3   ??? omeprazole (PRILOSEC) 40 MG capsule Take 40 mg by mouth daily.     ??? spironolactone (ALDACTONE) 100 MG tablet Take 1 tablet (100 mg total) by mouth daily. 30 tablet 11 No current facility-administered medications for this visit.        Changes to medications: Beyonce reports no changes at this time.    No Known Allergies    Changes to allergies: No    SPECIALTY MEDICATION ADHERENCE     Humira - 0 left  Medication Adherence    Patient reported X missed doses in the last month: 0  Specialty Medication: Humira  Patient is on additional specialty medications: No          Specialty medication(s) dose(s) confirmed: Regimen is correct and unchanged.     Are there any concerns with adherence? No    Adherence counseling provided? Not needed    CLINICAL MANAGEMENT AND INTERVENTION      Clinical Benefit Assessment:    Do you feel the medicine is effective or helping your condition? Yes    Clinical Benefit counseling provided? Not needed    Adverse Effects Assessment:    Are you experiencing any side effects? yes, minor injection itching. see above.    Are you experiencing difficulty administering your medicine? No    Quality of Life Assessment:    How many days over the past month did your psoriasis  keep you from your normal activities? For example, brushing your teeth or getting up in the  morning. 0    Have you discussed this with your provider? Not needed    Therapy Appropriateness:    Is therapy appropriate? Yes, therapy is appropriate and should be continued    DISEASE/MEDICATION-SPECIFIC INFORMATION      For patients on injectable medications: Patient currently has 0 doses left.  Next injection is scheduled for 8/11.    PATIENT SPECIFIC NEEDS     - Does the patient have any physical, cognitive, or cultural barriers? No    - Is the patient high risk? No     - Does the patient require a Care Management Plan? No     - Does the patient require physician intervention or other additional services (i.e. nutrition, smoking cessation, social work)? No      SHIPPING     Specialty Medication(s) to be Shipped:   Inflammatory Disorders: Humira    Other medication(s) to be shipped: No additional medications requested for fill at this time     Changes to insurance: No    Delivery Scheduled: Yes, Expected medication delivery date: Monday, Aug 9.     Medication will be delivered via Same Day Courier to the confirmed prescription address in [facility].    The patient will receive a drug information handout for each medication shipped and additional FDA Medication Guides as required.  Verified that patient has previously received a Conservation officer, historic buildings.    All of the patient's questions and concerns have been addressed.    [name]   [facility] Specialty Pharmacist

## 2020-06-13 DIAGNOSIS — I10 Essential (primary) hypertension: Principal | ICD-10-CM

## 2020-06-13 MED ORDER — METOPROLOL SUCCINATE ER 25 MG TABLET,EXTENDED RELEASE 24 HR
ORAL_TABLET | 3 refills | 0 days
Start: 2020-06-13 — End: ?

## 2020-06-15 MED ORDER — METOPROLOL SUCCINATE ER 25 MG TABLET,EXTENDED RELEASE 24 HR
ORAL_TABLET | Freq: Every day | ORAL | 2 refills | 180 days | Status: CP
Start: 2020-06-15 — End: 2021-06-15

## 2020-06-15 MED FILL — HUMIRA PEN CITRATE FREE 40 MG/0.4 ML: SUBCUTANEOUS | 28 days supply | Qty: 4 | Fill #0

## 2020-06-15 MED FILL — HUMIRA PEN CITRATE FREE 40 MG/0.4 ML: 28 days supply | Qty: 4 | Fill #0 | Status: AC

## 2020-06-15 NOTE — Unmapped (Signed)
P/C requesting refill on metoprolol , last ordered on 06/03/20, last visit 02/04/20.

## 2020-07-03 NOTE — Unmapped (Signed)
[facility] Refill Coordination Note    Specialty Medication(s) to be Shipped:   Inflammatory Disorders: Humira    Other medication(s) to be shipped: No additional medications requested for fill at this time     Heather Watts, DOB: [birth year]  Phone: [phone] (home)       All above HIPAA information was verified with patient.     Was a Nurse, learning disability used for this call? No    Completed refill call assessment today to schedule patient's medication shipment from the [facility] [phone]).       Specialty medication(s) and dose(s) confirmed: Regimen is correct and unchanged.   Changes to medications: Heather Watts reports no changes at this time.  Changes to insurance: No  Questions for the pharmacist: No    Confirmed patient received Welcome Packet with first shipment. The patient will receive a drug information handout for each medication shipped and additional FDA Medication Guides as required.       DISEASE/MEDICATION-SPECIFIC INFORMATION        For patients on injectable medications: Patient currently has 1 doses left.  Next injection is scheduled for 07/08/2020.    SPECIALTY MEDICATION ADHERENCE     Medication Adherence    Patient reported X missed doses in the last month: 0  Specialty Medication: Humira  Patient is on additional specialty medications: No  Any gaps in refill history greater than 2 weeks in the last 3 months: no  Demonstrates understanding of importance of adherence: yes  Informant: patient  Reliability of informant: reliable  Confirmed plan for next specialty medication refill: delivery by pharmacy  Refills needed for supportive medications: not needed                      SHIPPING     Shipping address confirmed in Epic.     Delivery Scheduled: Yes, Expected medication delivery date: 07/08/2020.     Medication will be delivered via Same Day Courier to the prescription address in Epic [facility].    Heather Watts D Heather Watts   [facility] Specialty Technician

## 2020-07-08 MED FILL — HUMIRA PEN CITRATE FREE 40 MG/0.4 ML: 28 days supply | Qty: 4 | Fill #1 | Status: AC

## 2020-07-08 MED FILL — HUMIRA PEN CITRATE FREE 40 MG/0.4 ML: SUBCUTANEOUS | 28 days supply | Qty: 4 | Fill #1

## 2020-07-30 NOTE — Unmapped (Signed)
[facility] Refill Coordination Note    Specialty Medication(s) to be Shipped:   Inflammatory Disorders: Humira    Other medication(s) to be shipped: No additional medications requested for fill at this time     Heather Watts, DOB: 26-Mar-1971  Phone: [phone] (home)       All above HIPAA information was verified with patient.     Was a Nurse, learning disability used for this call? No    Completed refill call assessment today to schedule patient's medication shipment from the [facility] [phone]).       Specialty medication(s) and dose(s) confirmed: Regimen is correct and unchanged.   Changes to medications: Heather Watts reports no changes at this time.  Changes to insurance: No  Questions for the pharmacist: No    Confirmed patient received Welcome Packet with first shipment. The patient will receive a drug information handout for each medication shipped and additional FDA Medication Guides as required.       DISEASE/MEDICATION-SPECIFIC INFORMATION        For patients on injectable medications: Patient currently has 3 doses left.  Next injection is scheduled for 07/30/2020.    SPECIALTY MEDICATION ADHERENCE     Medication Adherence    Patient reported X missed doses in the last month: 0  Specialty Medication: Humira Cf 40 mg/0.4 ml  Patient is on additional specialty medications: No  Any gaps in refill history greater than 2 weeks in the last 3 months: no  Demonstrates understanding of importance of adherence: yes  Informant: patient  Reliability of informant: reliable  Confirmed plan for next specialty medication refill: delivery by pharmacy  Refills needed for supportive medications: not needed                      SHIPPING     Shipping address confirmed in Epic.     Delivery Scheduled: Yes, Expected medication delivery date: 08/07/2020.     Medication will be delivered via Same Day Courier to the prescription address in Epic [facility].    Heather Watts   [facility] Specialty Technician

## 2020-08-07 MED FILL — HUMIRA PEN CITRATE FREE 40 MG/0.4 ML: SUBCUTANEOUS | 28 days supply | Qty: 4 | Fill #2

## 2020-08-07 MED FILL — HUMIRA PEN CITRATE FREE 40 MG/0.4 ML: 28 days supply | Qty: 4 | Fill #2 | Status: AC

## 2020-08-26 ENCOUNTER — Telehealth: Admit: 2020-08-26 | Discharge: 2020-08-27 | Payer: PRIVATE HEALTH INSURANCE

## 2020-08-26 DIAGNOSIS — R0789 Other chest pain: Principal | ICD-10-CM

## 2020-08-26 DIAGNOSIS — M62838 Other muscle spasm: Principal | ICD-10-CM

## 2020-08-26 DIAGNOSIS — Z1239 Encounter for other screening for malignant neoplasm of breast: Principal | ICD-10-CM

## 2020-08-26 MED ORDER — CYCLOBENZAPRINE 5 MG TABLET
ORAL_TABLET | 0 refills | 0 days | Status: CP
Start: 2020-08-26 — End: ?

## 2020-08-26 NOTE — Unmapped (Unsigned)
[facility]  ADULT TELEHEALTH VISIT    {    Coding tips - Do not edit this text, it will delete upon signing of note!    ?? Telephone visits [phone] for Physicians and APP???s and [phone] for Non- Physician Clinicians)- Only use minutes on the phone to determine level of service.    ?? Video visits [phone]) - Use both minutes on video and pre/post minutes to determine level of service.       :75688}    I spent 20 minutes on the real-time audio and video with the patient on the date of service. I spent an additional 5 minutes on pre- and post-visit activities on the date of service.     The patient was physically located in [place] or a state in which I am permitted to provide care. The patient and/or parent/guardian understood that s/he may incur co-pays and cost sharing, and agreed to the telemedicine visit. The visit was reasonable and appropriate under the circumstances given the patient's presentation at the time.    The patient and/or parent/guardian has been advised of the potential risks and limitations of this mode of treatment (including, but not limited to, the absence of in-person examination) and has agreed to be treated using telemedicine. The patient's/patient's family's questions regarding telemedicine have been answered.     If the visit was completed in an ambulatory setting, the patient and/or parent/guardian has also been advised to contact their provider???s office for worsening conditions, and seek emergency medical treatment and/or call 911 if the patient deems either necessary.        Assessment & Plan:     Janeece Blok is a 49 y.o. female with past medical history including HTN, HLD, hidradenitis suppurativa who presents for likely left conjunctival hemorrhage.    There are no diagnoses linked to this encounter.Advised patient to try to measure blood pressures at home and to message [place] with her blood pressures.  If not able to check blood pressures, advised to come in to clinic for nurse visit to check her blood pressure.  Her next follow-up with her PCP is next month.  Reviewed ER precautions.    Counseled on expected course for conjunctival hemorrhage.  Reviewed call precautions.  Patient voiced understanding.    Follow up - next month as previously scheduled for HTN follow-up with PCP.      08/25/2020    Medication adherence and barriers to the treatment plan have been addressed. Opportunities to optimize healthy behaviors have been discussed. Patient / caregiver voiced understanding.        Orders this visit:  No orders of the defined types were placed in this encounter.    Future Appointments   Date Time Provider Department Center   08/26/2020 11:00 AM Falisha Osment Rolla Flatten, MD [facility]   10/20/2020  8:40 AM Keri Rosita Fire, FNP [facility]         Subjective:     Khaleah Duer is a 49 y.o. female with past medical history including HTN, HLD, hidradenitis suppurativa who presents for   No chief complaint on file.    Patient unaccompanied.    Yesterday morning, starting having red spot in her eye. No eye pain. Had a headache the day before, about 12 hours. Lasted for 30 minutes, 6/10 intensity. This headache was on the left side but usually has right sided migraines which are about 8/10 intensity.  Also briefly had some dizziness  the morning that she noticed the red spot on her left eye, but she typically has brief bouts of dizziness prior to her period. Believes that her period is about to come on and states that these symptoms are typical of her premenstrual symptoms.    BP during today's visit was not readable on the monitor.  Patient endorses past issues with her blood pressure monitor.  Of note, during her last visit with her PCP in March 2021, her blood pressure was closer to goal at 134/88.  She was also recently started on spironolactone by her dermatologist.    Denies eye pain, coughing, sneezing, trauma, discharge, itching, eye pain.  Has not noticed symptoms like this previously.  Denies vision symptoms or vision changes.      ROS:  Pertinent items noted in HPI     Past Medical History:  Past Medical History:   Diagnosis Date   ??? GERD (gastroesophageal reflux disease)    ??? Hyperlipidemia    ??? Hypertension        Medications:  Current Outpatient Medications   Medication Sig Dispense Refill   ??? doxycycline (VIBRA-TABS) 100 MG tablet Take one tablet by mouth twice daily. (Patient not taking: Reported on 05/13/2020) 60 tablet 0   ??? empty container Misc Use as directed to dispose of Humira pens. 1 each 2   ??? fluticasone propionate (FLONASE) 50 mcg/actuation nasal spray 1 spray by Each Nare route once as needed for rhinitis. 16 g 2   ??? HUMIRA PEN CITRATE FREE 40 MG/0.4 ML Inject the contents of 1 pen (40 mg total) under the skin every seven (7) days. (Patient not taking: Reported on 05/13/2020) 4 each 11   ??? HUMIRA PEN CITRATE FREE STARTER PACK FOR CROHN'S/UC/HS 3 X 80 MG/0.8 ML Inject the contents of 2 pens (160mg ) under the skin on day 1, then inject the contents of 1 pen (80mg ) on day 15. (Patient not taking: Reported on 05/13/2020) 3 each 0   ??? hydroCHLOROthiazide (HYDRODIURIL) 25 MG tablet Take 1 tablet (25 mg total) by mouth daily. 90 tablet 3   ??? metoprolol succinate (TOPROL-XL) 25 MG 24 hr tablet Take 1 tablet (25 mg total) by mouth daily. 180 tablet 2   ??? omeprazole (PRILOSEC) 40 MG capsule Take 40 mg by mouth daily.     ??? spironolactone (ALDACTONE) 100 MG tablet Take 1 tablet (100 mg total) by mouth daily. 30 tablet 11     No current facility-administered medications for this visit.       Allergies:  No Known Allergies    Objective:   As part of a virtual care visit, an in-person exam was not performed.    Gen: NAD, alert.  HEENT: EOMI, No Icterus.  Appears to have small area of erythema lateral to the iris of the left eye.  No other scleral injection or discharge noted.  NECK: Supple.   PULM: CWOB.  Easily completes lateral to the iris of the left eye.  No other scleral injection or discharge noted.  NECK: Supple.   PULM: CWOB.  Easily completes sentences.        LABS/STUDIES:  Results for orders placed or performed in visit on 04/30/20   CBC w/ Differential   Result Value Ref Range    WBC 9.3 3.4 - 10.8 x10E3/uL    RBC 4.69 3.77 - 5.28 x10E6/uL    HGB 12.3 11.1 - 15.9 g/dL    HCT 41.3 24.4 - 01.0 %  MCV 79 79.0 - 97.0 fL    MCH 26.2 (L) 26.6 - 33.0 pg    MCHC 33.2 31.5 - 35.7 g/dL    RDW 72.5 36.6 - 44.0 %    Platelet 338 150 - 450 x10E3/uL    Neutrophils % 61 Not Estab. %    Lymphocytes % 28 Not Estab. %    Monocytes % 8 Not Estab. %    Eosinophils % 2 Not Estab. %    Basophils % 0 Not Estab. %    Absolute Neutrophils 5.7 1.4 - 7.0 x10E3/uL    Absolute Lymphocytes 2.6 0.7 - 3.1 x10E3/uL    Absolute Monocytes  0.8 0.1 - 0.9 x10E3/uL    Absolute Eosinophils 0.2 0.0 - 0.4 x10E3/uL    Absolute Basophils  0.0 0.0 - 0.2 x10E3/uL    Immature Granulocytes 1 Not Estab. %    Bands Absolute 0.1 0 - 0 x10E3/uL   Hepatitis B Core Antibody, total   Result Value Ref Range    Hep B Core Total Ab Negative Negative   Hepatitis B Surface Antibody   Result Value Ref Range    Hep B Surface Ab, Qual Reactive    Hepatitis B Surface Antigen   Result Value Ref Range    Hepatitis B Surface Ag Negative Negative   Hepatitis C Antibody   Result Value Ref Range    Hep C Virus Ab <0.1 0.0 - 0.9 s/co ratio   Quantiferon TB Gold Plus   Result Value Ref Range    QuantiFERON Incubation Incubation performed.     QuantiFERON Criteria Comment     QuantiFERON TB1 Ag Value 0.10 IU/mL    QuantiFERON TB2 Ag Value 0.14 IU/mL    QuantiFERON Nil Value 0.05 IU/mL    QuantiFERON Mitogen Value >10.00 IU/mL    QuantiFERON TB Gold Negative Negative                 Answers for HPI/ROS submitted by the patient on 08/24/2020  Chronicity: chronic  Onset: more than 1 month ago  Frequency: constantly  Progression since onset: waxing and waning  Pain location: thoracic the morning  abdominal pain: No  bladder incontinence: No  bowel incontinence: No  chest pain: No  dysuria: No  fever: No  headaches: No  leg pain: No  numbness: No  paresis: No  paresthesias: No  pelvic pain: No  perianal numbness: No  tingling: No  weakness: No  weight loss: No  Risk factors: lack of exercise, obesity, poor posture, sedentary lifestyle

## 2020-08-31 NOTE — Unmapped (Signed)
[facility] Pharm[facility] Refill Coordination Note    Specialty Medication(s) to be Shipped:   Inflammatory Disorders: Humira    Other medication(s) to be shipped: No additional medications requested for fill at this time     Heather Watts, DOB: [birth year]  Phone: [phone] (home)       All above HIPAA information was verified with patient.     Was a Nurse, learning disability used for this call? No    Completed refill call assessment today to schedule patient's medication shipment from the [facility] [phone]).       Specialty medication(s) and dose(s) confirmed: Regimen is correct and unchanged.   Changes to medications: Heather Watts reports no changes at this time.  Changes to insurance: No  Questions for the pharmacist: No    Confirmed patient received Welcome Packet with first shipment. The patient will receive a drug information handout for each medication shipped and additional FDA Medication Guides as required.       DISEASE/MEDICATION-SPECIFIC INFORMATION        For patients on injectable medications: Patient currently has 1 doses left.  Next injection is scheduled for 09/03/2020.    SPECIALTY MEDICATION ADHERENCE     Medication Adherence    Patient reported X missed doses in the last month: 0  Specialty Medication: Humira Cf 40 mg/0.4 ml  Patient is on additional specialty medications: No  Any gaps in refill history greater than 2 weeks in the last 3 months: no  Demonstrates understanding of importance of adherence: yes  Informant: patient  Reliability of informant: reliable  Confirmed plan for next specialty medication refill: delivery by pharmacy  Refills needed for supportive medications: not needed                      SHIPPING     Shipping address confirmed in Epic.     Delivery Scheduled: Yes, Expected medication delivery date: 09/07/2020.     Medication will be delivered via Same Day Courier to the prescription address in Epic WAM.    Heather Watts Heather Watts   [facility] Specialty Technician

## 2020-09-06 DIAGNOSIS — I10 Essential (primary) hypertension: Principal | ICD-10-CM

## 2020-09-06 MED ORDER — HYDROCHLOROTHIAZIDE 25 MG TABLET
ORAL_TABLET | Freq: Every day | ORAL | 1 refills | 90 days | Status: CP
Start: 2020-09-06 — End: 2021-09-06

## 2020-09-07 MED FILL — HUMIRA PEN CITRATE FREE 40 MG/0.4 ML: SUBCUTANEOUS | 28 days supply | Qty: 4 | Fill #3

## 2020-09-07 MED FILL — HUMIRA PEN CITRATE FREE 40 MG/0.4 ML: 28 days supply | Qty: 4 | Fill #3 | Status: AC

## 2020-09-28 NOTE — Unmapped (Signed)
[facility] Specialty Pharmacy Refill Coordination Note    Specialty Medication(s) to be Shipped:   Inflammatory Disorders: Humira    Other medication(s) to be shipped: No additional medications requested for fill at this time     Heather Watts, DOB: Mar 16, 1971  Phone: [phone] (home)       All above HIPAA information was verified with patient.     Was a Nurse, learning disability used for this call? No    Completed refill call assessment today to schedule patient's medication shipment from the [facility] [phone]).       Specialty medication(s) and dose(s) confirmed: Regimen is correct and unchanged.   Changes to medications: Heather Watts reports no changes at this time.  Changes to insurance: No  Questions for the pharmacist: No    Confirmed patient received Welcome Packet with first shipment. The patient will receive a drug information handout for each medication shipped and additional FDA Medication Guides as required.       DISEASE/MEDICATION-SPECIFIC INFORMATION        For patients on injectable medications: Patient currently has 1 doses left.  Next injection is scheduled for 10/03/2020.    SPECIALTY MEDICATION ADHERENCE     Medication Adherence    Patient reported X missed doses in the last month: 0  Specialty Medication: Humira Cf 40 mg/0.4 ml  Patient is on additional specialty medications: No  Any gaps in refill history greater than 2 weeks in the last 3 months: no  Demonstrates understanding of importance of adherence: yes  Informant: patient  Reliability of informant: reliable  Confirmed plan for next specialty medication refill: delivery by pharmacy  Refills needed for supportive medications: not needed                      SHIPPING     Shipping address confirmed in Epic.     Delivery Scheduled: Yes, Expected medication delivery date: 10/07/2020.     Medication will be delivered via Same Day Courier to the prescription address in Epic WAM.    Heather Watts D Saahir Prude   [facility] Specialty Technician

## 2020-09-29 DIAGNOSIS — I10 Essential (primary) hypertension: Principal | ICD-10-CM

## 2020-09-29 MED ORDER — METOPROLOL SUCCINATE ER 25 MG TABLET,EXTENDED RELEASE 24 HR
ORAL_TABLET | Freq: Two times a day (BID) | ORAL | 1 refills | 90 days | Status: CP
Start: 2020-09-29 — End: 2021-09-29

## 2020-10-05 NOTE — Unmapped (Deleted)
Dermatology Note     Assessment and Plan:      Hidradenitis suppurativa, Heather Watts 3:   Involves armpits, inframammary, and groin. Armpits and left inframammary are biggest problem. Has numerous persistent draining sinuses.    - reviewed chronic, waxing/waning nature of condition and associated comorbid conditions (pilonidal cysts, IBD, psoriasis, acne, dissecting cellulitis) and potential for sinus tract formation and scarring. Discussed that combination of medical and surgical therapy is often required for treatment.   - reviewed role of antibiotics in HS to decrease inflammation; doxycycline was minimally helpful in the past  - reviewed role of hair follicle in promoting disease process and thus role for laser hair removal   - reviewed role of hormones on HS and consequent use of spironolactone in treating disease process   - reviewed role of localized surgical procedures such as ILK, punch excisions, unroofing procedures and compared to surgical interventions by plastic surgery in the operating room; Once under better control with less drainage (hopefully next visit) will schedule for unroofing.   - reviewed role of biologic therapy including humira    - based on discussion above and r/b/a reviewed, mutual decision to proceed with the following:    - Start spironolactone (ALDACTONE) 100 MG tablet; Take 1 tablet (100 mg total) by mouth daily.   - Not sexually active, no history of renal disease.    - On hydrochlorothiazide, metoprolol but no issues with hypotension. She will monitor.   - Start HUMIRA PEN CITRATE FREE STARTER PACK FOR CROHN'S/UC/HS 3 X 80 MG/0.8 ML; 160 mg day 1, then 80 mg on day 15 then 40 mg every week  - Start HUMIRA PEN CITRATE FREE 40 MG/0.4 ML; Inject 0.4 mL (40 mg total) under the skin every seven (7) days.   - R/b discussed at length. Handout given.     High risk medication use (on Humira)  - Reviewed baseline labs on 04/2020 which were all WNL (CBC w/ Differential, Hep B and C serologies, Quant TB Gold Plus).     There are no diagnoses linked to this encounter.    The patient was advised to call for an appointment should any new, changing, or symptomatic lesions develop.     RTC: No follow-ups on file. or sooner as needed   _________________________________________________________________      Chief Complaint     No chief complaint on file.      HPI     Heather Watts is a 49 y.o. female who presents as a returning patient (last seen by Dr. Dareen Piano on 04/30/2020) to [facility] for follow up of HS. At last visit, patient was started on Humira and spironolactone for management of HS. ***    The patient denies any other new or changing lesions or areas of concern.     Pertinent Past Medical History     No history of skin cancer    Hypertension  Hyperlipidemia  Gastroesophageal reflux disease  Hidradenitis    Family History:   {derm skin check melanoma history:75452}    Past Medical History, Family History, Social History, Medication List, Allergies, and Problem List were reviewed in the rooming section of Epic.     ROS: Other than symptoms mentioned in the HPI, no fevers, chills, or other skin complaints    Physical Examination     GENERAL: Well-appearing female in no acute distress, resting comfortably.  NEURO: Alert and oriented, answers questions appropriately  PSYCH: Normal mood and affect  {PE extent:75514}  {  PE list:75421}  ***    All areas not commented on are within normal limits or unremarkable    Scribe's Attestation: Rory Percy, MD obtained and performed the history, physical exam and medical decision making elements that were entered into the chart. Signed by Hazle Nordmann, Scribe, on ***.    {*** NOTE TO PROVIDER: PLEASE ADD ATTESTATION NOTING YOU AGREE WITH SCRIBE DOCUMENTATION}     (Approved Template 07/20/2020)

## 2020-10-07 MED FILL — HUMIRA PEN CITRATE FREE 40 MG/0.4 ML: SUBCUTANEOUS | 28 days supply | Qty: 4 | Fill #4

## 2020-10-07 MED FILL — HUMIRA PEN CITRATE FREE 40 MG/0.4 ML: 28 days supply | Qty: 4 | Fill #4 | Status: AC

## 2020-10-29 NOTE — Unmapped (Signed)
[facility] Pharm[facility] Refill Coordination Note    Specialty Medication(s) to be Shipped:   Inflammatory Disorders: Humira    Other medication(s) to be shipped: No additional medications requested for fill at this time     Heather Watts, DOB: 1970-11-09  Phone: [phone] (home)       All above HIPAA information was verified with patient.     Was a Nurse, learning disability used for this call? No    Completed refill call assessment today to schedule patient's medication shipment from the [facility] [phone]).       Specialty medication(s) and dose(s) confirmed: Regimen is correct and unchanged.   Changes to medications: Heather Watts reports no changes at this time.  Changes to insurance: No  Questions for the pharmacist: No    Confirmed patient received Welcome Packet with first shipment. The patient will receive a drug information handout for each medication shipped and additional FDA Medication Guides as required.       DISEASE/MEDICATION-SPECIFIC INFORMATION        For patients on injectable medications: Patient currently has 1 doses left.  Next injection is scheduled for 10/29/2020.    SPECIALTY MEDICATION ADHERENCE     Medication Adherence    Patient reported X missed doses in the last month: 0  Specialty Medication: Humira Cf 40 mg/0.4 ml   Patient is on additional specialty medications: No  Any gaps in refill history greater than 2 weeks in the last 3 months: no  Demonstrates understanding of importance of adherence: yes  Informant: patient  Reliability of informant: reliable  Confirmed plan for next specialty medication refill: delivery by pharmacy  Refills needed for supportive medications: not needed                      SHIPPING     Shipping address confirmed in Epic.     Delivery Scheduled: Yes, Expected medication delivery date: 11/03/2020.     Medication will be delivered via Same Day Courier to the prescription address in Epic WAM.    Heather Watts   [facility] Specialty Technician

## 2020-11-02 ENCOUNTER — Ambulatory Visit: Payer: Self-pay | Attending: Internal Medicine

## 2020-11-02 DIAGNOSIS — Z23 Encounter for immunization: Secondary | ICD-10-CM

## 2020-11-02 NOTE — Progress Notes (Signed)
   Covid-19 Vaccination Clinic  Name:  Alice Stephens    MRN: [id number] DOB: 07-28-1971  11/02/2020  [name] was observed post Covid-19 immunization for 15 minutes without incident. She was provided with Vaccine Information Sheet and instruction to access the V-Safe system.   [name] was instructed to call 911 with any severe reactions post vaccine: Marland Kitchen Difficulty breathing  . Swelling of face and throat  . A fast heartbeat  . A bad rash all over body  . Dizziness and weakness   Immunizations Administered    Name Date Dose VIS Date Route   Pfizer COVID-19 Vaccine 11/02/2020  1:37 PM 0.3 mL 08/26/2020 Intramuscular   Manufacturer: [employer], [employer]   Lot: [id number]   NDC: [address]-9

## 2020-11-03 MED FILL — HUMIRA PEN CITRATE FREE 40 MG/0.4 ML: 28 days supply | Qty: 4 | Fill #5 | Status: AC

## 2020-11-03 MED FILL — HUMIRA PEN CITRATE FREE 40 MG/0.4 ML: SUBCUTANEOUS | 28 days supply | Qty: 4 | Fill #5

## 2020-11-28 NOTE — Unmapped (Signed)
[facility] Clinical Assessment & Refill Coordination Note    Heather Watts, DOB: [birth year]  Phone: [phone] (home)     All above HIPAA information was verified with patient.     Was a Nurse, learning disability used for this call? No    Specialty Medication(s):   Inflammatory Disorders: Humira     Current Outpatient Medications   Medication Sig Dispense Refill   ??? cyclobenzaprine (FLEXERIL) 5 MG tablet Take 1-2 tabs po tid prn 30 tablet 0   ??? doxycycline (VIBRA-TABS) 100 MG tablet Take one tablet by mouth twice daily. (Patient not taking: Reported on 05/13/2020) 60 tablet 0   ??? empty container Misc Use as directed to dispose of Humira pens. 1 each 2   ??? fluticasone propionate (FLONASE) 50 mcg/actuation nasal spray 1 spray by Each Nare route once as needed for rhinitis. 16 g 2   ??? HUMIRA PEN CITRATE FREE 40 MG/0.4 ML Inject the contents of 1 pen (40 mg total) under the skin every seven (7) days. 4 each 11   ??? HUMIRA PEN CITRATE FREE STARTER PACK FOR CROHN'S/UC/HS 3 X 80 MG/0.8 ML Inject the contents of 2 pens (160mg ) under the skin on day 1, then inject the contents of 1 pen (80mg ) on day 15. (Patient not taking: Reported on 05/13/2020) 3 each 0   ??? hydroCHLOROthiazide (HYDRODIURIL) 25 MG tablet Take 1 tablet (25 mg total) by mouth daily. TAKE 1 TABLET(25 MG) BY MOUTH DAILY 90 tablet 1   ??? metoprolol succinate (TOPROL-XL) 25 MG 24 hr tablet Take 1 tablet (25 mg total) by mouth Two (2) times a day. 180 tablet 1   ??? omeprazole (PRILOSEC) 40 MG capsule Take 40 mg by mouth daily. (Patient not taking: Reported on 08/26/2020)     ??? spironolactone (ALDACTONE) 100 MG tablet Take 1 tablet (100 mg total) by mouth daily. 30 tablet 11     No current facility-administered medications for this visit.        Changes to medications: [name] reports no changes at this time.    No Known Allergies    Changes to allergies: No    SPECIALTY MEDICATION ADHERENCE     Humira 40/0.4 mg/ml: 13 days of medicine on hand Medication Adherence    Patient reported X missed doses in the last month: 1  Specialty Medication: Humira 40 mg/0.4 ml  Patient is on additional specialty medications: No  Informant: patient  Confirmed plan for next specialty medication refill: delivery by pharmacy  Refills needed for supportive medications: not needed          Specialty medication(s) dose(s) confirmed: Regimen is correct and unchanged.     Are there any concerns with adherence? No    Adherence counseling provided? Not needed    CLINICAL MANAGEMENT AND INTERVENTION      Clinical Benefit Assessment:    Do you feel the medicine is effective or helping your condition? Yes    Clinical Benefit counseling provided? Not needed    Adverse Effects Assessment:    Are you experiencing any side effects? No    Are you experiencing difficulty administering your medicine? No    Quality of Life Assessment:    How many days over the past month did your HS  keep you from your normal activities? For example, brushing your teeth or getting up in the morning. 0    Have you discussed this with your provider? Not needed    Therapy Appropriateness:  Is therapy appropriate? Yes, therapy is appropriate and should be continued    DISEASE/MEDICATION-SPECIFIC INFORMATION      For patients on injectable medications: Patient currently has 1 doses left.  Next injection is scheduled for 12/03/20.    PATIENT SPECIFIC NEEDS     - Does the patient have any physical, cognitive, or cultural barriers? No    - Is the patient high risk? No    - Does the patient require a Care Management Plan? No     - Does the patient require physician intervention or other additional services (i.e. nutrition, smoking cessation, social work)? No      SHIPPING     Specialty Medication(s) to be Shipped:   Inflammatory Disorders: Humira    Other medication(s) to be shipped: No additional medications requested for fill at this time     Changes to insurance: No    Delivery Scheduled: Yes, Expected medication delivery date: 12/04/20.     Medication will be delivered via Same Day Courier to the confirmed prescription address in [facility].    The patient will receive a drug information handout for each medication shipped and additional FDA Medication Guides as required.  Verified that patient has previously received a Conservation officer, historic buildings.    All of the patient's questions and concerns have been addressed.    Heather Watts Vangie Bicker   [facility] Shared [facility] Specialty Pharmacist

## 2020-12-04 MED FILL — HUMIRA PEN CITRATE FREE 40 MG/0.4 ML: SUBCUTANEOUS | 28 days supply | Qty: 4 | Fill #6

## 2020-12-18 DIAGNOSIS — L732 Hidradenitis suppurativa: Principal | ICD-10-CM

## 2020-12-18 MED ORDER — DOXYCYCLINE HYCLATE 100 MG TABLET
ORAL_TABLET | 0 refills | 0 days
Start: 2020-12-18 — End: ?

## 2020-12-24 NOTE — Unmapped (Signed)
[facility] Refill Coordination Note    Specialty Medication(s) to be Shipped:   Inflammatory Disorders: Humira    Other medication(s) to be shipped: No additional medications requested for fill at this time     Heather Watts, DOB: 02/18/1971  Phone: [phone] (home)       All above HIPAA information was verified with patient.     Was a Nurse, learning disability used for this call? No    Completed refill call assessment today to schedule patient's medication shipment from the [facility] [phone]).       Specialty medication(s) and dose(s) confirmed: Regimen is correct and unchanged.   Changes to medications: Jayde reports no changes at this time.  Changes to insurance: No  Questions for the pharmacist: No    Confirmed patient received Welcome Packet with first shipment. The patient will receive a drug information handout for each medication shipped and additional FDA Medication Guides as required.       DISEASE/MEDICATION-SPECIFIC INFORMATION        For patients on injectable medications: Patient currently has 2 doses left.  Next injection is scheduled for 12/24/20.    SPECIALTY MEDICATION ADHERENCE     Medication Adherence    Patient reported X missed doses in the last month: 0  Specialty Medication: Humira  Patient is on additional specialty medications: No                Humira 40/0.4 mg/ml: 7 days of medicine on hand          SHIPPING     Shipping address confirmed in Epic.     Delivery Scheduled: Yes, Expected medication delivery date: 12/31/20.     Medication will be delivered via Same Day Courier to the prescription address in Epic WAM.    Unk Lightning   [facility] Specialty Technician

## 2020-12-31 MED FILL — HUMIRA PEN CITRATE FREE 40 MG/0.4 ML: SUBCUTANEOUS | 28 days supply | Qty: 4 | Fill #7

## 2021-01-02 ENCOUNTER — Encounter: Admit: 2021-01-02 | Discharge: 2021-01-03 | Payer: PRIVATE HEALTH INSURANCE

## 2021-01-02 ENCOUNTER — Ambulatory Visit: Admit: 2021-01-02 | Discharge: 2021-01-03 | Payer: PRIVATE HEALTH INSURANCE

## 2021-01-02 ENCOUNTER — Encounter
Admit: 2021-01-02 | Discharge: 2021-01-03 | Disposition: A | Discharge status: Home / Self Care | Payer: PRIVATE HEALTH INSURANCE

## 2021-01-02 DIAGNOSIS — R079 Chest pain, unspecified: Principal | ICD-10-CM

## 2021-01-02 DIAGNOSIS — R9389 Abnormal findings on diagnostic imaging of other specified body structures: Principal | ICD-10-CM

## 2021-01-02 DIAGNOSIS — I313 Pericardial effusion (noninflammatory): Principal | ICD-10-CM

## 2021-01-02 DIAGNOSIS — R0789 Other chest pain: Principal | ICD-10-CM

## 2021-01-02 LAB — TSH: THYROID STIMULATING HORMONE: 0.671 u[IU]/mL (ref 0.550–4.780)

## 2021-01-02 LAB — COMPREHENSIVE METABOLIC PANEL
ALBUMIN: 4 g/dL (ref 3.4–5.0)
ALKALINE PHOSPHATASE: 59 U/L (ref 46–116)
ALT (SGPT): 28 U/L (ref 10–49)
ANION GAP: 7 mmol/L (ref 5–14)
AST (SGOT): 23 U/L (ref <=34)
BILIRUBIN TOTAL: 0.5 mg/dL (ref 0.3–1.2)
BLOOD UREA NITROGEN: 11 mg/dL (ref 9–23)
BUN / CREAT RATIO: 14
CALCIUM: 10.5 mg/dL — ABNORMAL HIGH (ref 8.7–10.4)
CHLORIDE: 105 mmol/L (ref 98–107)
CO2: 25.3 mmol/L (ref 20.0–31.0)
CREATININE: 0.81 mg/dL — ABNORMAL HIGH
EGFR CKD-EPI AA FEMALE: >90 mL/min/{1.73_m2} (ref >=60)
EGFR CKD-EPI NON-AA FEMALE: 86 mL/min/{1.73_m2} (ref >=60)
GLUCOSE RANDOM: 92 mg/dL (ref 70–179)
POTASSIUM: 3.7 mmol/L (ref 3.4–4.5)
PROTEIN TOTAL: 7.8 g/dL (ref 5.7–8.2)
SODIUM: 137 mmol/L (ref 135–145)

## 2021-01-02 LAB — B-TYPE NATRIURETIC PEPTIDE: B-TYPE NATRIURETIC PEPTIDE: 2.38 pg/mL (ref <=100)

## 2021-01-02 LAB — CBC W/ AUTO DIFF
BASOPHILS ABSOLUTE COUNT: 0.1 10*9/L (ref 0.0–0.1)
BASOPHILS RELATIVE PERCENT: 1 %
EOSINOPHILS ABSOLUTE COUNT: 0.2 10*9/L (ref 0.0–0.7)
EOSINOPHILS RELATIVE PERCENT: 2.5 %
HEMATOCRIT: 40.6 % (ref 35.0–44.0)
HEMOGLOBIN: 13.6 g/dL (ref 12.0–15.5)
LYMPHOCYTES ABSOLUTE COUNT: 3.6 10*9/L (ref 0.7–4.0)
LYMPHOCYTES RELATIVE PERCENT: 46.1 %
MEAN CORPUSCULAR HEMOGLOBIN CONC: 33.5 g/dL (ref 30.0–36.0)
MEAN CORPUSCULAR HEMOGLOBIN: 27.1 pg (ref 26.0–34.0)
MEAN CORPUSCULAR VOLUME: 80.9 fL — ABNORMAL LOW (ref 82.0–98.0)
MEAN PLATELET VOLUME: 7.1 fL (ref 7.0–10.0)
MONOCYTES ABSOLUTE COUNT: 0.8 10*9/L (ref 0.1–1.0)
MONOCYTES RELATIVE PERCENT: 9.8 %
NEUTROPHILS ABSOLUTE COUNT: 3.2 10*9/L (ref 1.7–7.7)
NEUTROPHILS RELATIVE PERCENT: 40.6 %
NUCLEATED RED BLOOD CELLS: 0 /100{WBCs} (ref <=4)
PLATELET COUNT: 348 10*9/L (ref 150–450)
RED BLOOD CELL COUNT: 5.01 10*12/L (ref 3.90–5.03)
RED CELL DISTRIBUTION WIDTH: 14.2 % (ref 12.0–15.0)
WBC ADJUSTED: 7.9 10*9/L (ref 3.5–10.5)

## 2021-01-02 LAB — HIGH SENSITIVITY TROPONIN I - SERIAL: HIGH SENSITIVITY TROPONIN I: <3 ng/L (ref <=34)

## 2021-01-02 LAB — HCG QUANTITATIVE, BLOOD: GONADOTROPIN, CHORIONIC (HCG) QUANT: <2.6 m[IU]/mL

## 2021-01-02 NOTE — Unmapped (Signed)
Patient presents here with c/o rt sided chest pain which increases with movement since Thursday.denies any other symptoms. Seen at the [facility] urgent care and had ekg and chest x-ray. Was told that her heart is enlarged in the chest x-ray and that she needed further work up.

## 2021-01-02 NOTE — Unmapped (Signed)
Name:  Heather Watts  DOB: 08/20/1971  Date: 01/02/2021    ASSESSMENT/PLAN:  Ranay was seen today for chest wall pain.    Diagnoses and all orders for this visit:    Chest wall pain  -     ECG 12 Lead (Clinic Performed)  -     XR Chest 2 views      Heather Watts is a 50 y.o. female is a well-appearing patient.  No acute distress.  Mid chest pain only present with certain movements or direct palpation.  Mid sternal to right midsternal at approximately 4/5 ribs pain fully reproducible by direct palpation as well as right arm twisting and leaning forward with back arched.  Pain resolves when patient sitting still.  Suspect chest wall contusion and chest wall pain.  EKG consistent with previous.  Chest x-ray results per radiologist reviewed, results below.  Chest x-ray noting increased size of cardiomediastinal silhouette with borderline cardiac enlargement, recommendation of further echocardiogram due to heart size with possible pericardial effusion, prominent interstitial markings.  Due to midsternal chest pain as well as abnormal chest x-ray, recommend further evaluation at this time emergency room.  Patient verbalized understanding and agreed to plan.    Discussed follow up with Primary care physician this week. Discussed follow up and return parameters including no resolution or any worsening concerns. Patient verbalized understanding and agreed to plan.     Pertinent labs & imaging results that were available during my care of the patient were reviewed by me and considered in my medical decision making (see chart for details).     ------------------------------------------------------------------------------    Chief Complaint   Patient presents with   ??? Chest Wall Pain     Patient states that she has pain at the strurnum with movement and when she lays flat. States thats she has this often over the last 10 years.       HPI: Heather Watts is a 50 y.o. female past medical history of GERD, hypertension, hyperlipidemia and recurrent chest wall pain, presenting for evaluation of mid chest pain.  Patient reports pain feels similar to her previous chest wall pain.  States the chest pain is mid to right sternum and middle of chest and only present if she twists while moving her arms, or leans forward.  States pain onset was Thursday morning upon awakening.  States initially she felt this pain also with deep breaths, but reports that is better and resolved.  Patient reports this is the same place she has recurrent chest wall pain, but reports on Wednesday night she was goofing off with her 46 year old daughter and 70 year old daughter accidentally elbowed her in the mid chest.  Patient reports she then went to bed and woke up with pain in the same area of her normal pain as well as the same area that her daughter elbowed her.  Patient states that she sits upright and sits still she has no pain.  Denies fall.  Denies cough, congestion, fevers.  Denies any pain radiation, arm pain, back pain, neck pain, paresthesias, dizziness, weakness, rash, skin changes.  States she can recreate the pain every time she presses on the area to the same level.  Not take any over-the-counter medications for the same complaints.  Reports otherwise doing well.    ROS:  Constitutional: No fever/chills  Eyes: No visual changes.  ENT: No sore throat.  Cardiovascular: Positive chest pain.  Respiratory: Denies shortness of breath.  Gastrointestinal: No abdominal pain.  No nausea, no vomiting.  No diarrhea.  No constipation.  Genitourinary: Negative for dysuria.  Musculoskeletal: Negative for back pain.  Skin: Negative for rash.  Neurological: Negative for headaches, focal weakness or numbness.    I have reviewed past medical, surgical, medications, allergies, social and family histories today and updated them in Epic where appropriate.    PMH:  Past Medical History:   Diagnosis Date   ??? GERD (gastroesophageal reflux disease)    ??? Hyperlipidemia    ??? Hypertension        SURGICAL HX:  No past surgical history on file.    MEDS:    Current Outpatient Medications:   ???  fluticasone propionate (FLONASE) 50 mcg/actuation nasal spray, 1 spray by Each Nare route once as needed for rhinitis., Disp: 16 g, Rfl: 2  ???  HUMIRA PEN CITRATE FREE 40 MG/0.4 ML, Inject the contents of 1 pen (40 mg total) under the skin every seven (7) days., Disp: 4 each, Rfl: 11  ???  hydroCHLOROthiazide (HYDRODIURIL) 25 MG tablet, Take 1 tablet (25 mg total) by mouth daily. TAKE 1 TABLET(25 MG) BY MOUTH DAILY, Disp: 90 tablet, Rfl: 1  ???  metoprolol succinate (TOPROL-XL) 25 MG 24 hr tablet, Take 1 tablet (25 mg total) by mouth Two (2) times a day., Disp: 180 tablet, Rfl: 1  ???  omeprazole (PRILOSEC) 40 MG capsule, Take 40 mg by mouth daily. , Disp: , Rfl:   ???  spironolactone (ALDACTONE) 100 MG tablet, Take 1 tablet (100 mg total) by mouth daily., Disp: 30 tablet, Rfl: 11  ???  cyclobenzaprine (FLEXERIL) 5 MG tablet, Take 1-2 tabs po tid prn (Patient not taking: Reported on 01/02/2021), Disp: 30 tablet, Rfl: 0  ???  doxycycline (VIBRA-TABS) 100 MG tablet, Take one tablet by mouth twice daily. (Patient not taking: Reported on 05/13/2020), Disp: 60 tablet, Rfl: 0  ???  empty container Misc, Use as directed to dispose of Humira pens., Disp: 1 each, Rfl: 2  ???  HUMIRA PEN CITRATE FREE STARTER PACK FOR CROHN'S/UC/HS 3 X 80 MG/0.8 ML, Inject the contents of 2 pens (160mg ) under the skin on day 1, then inject the contents of 1 pen (80mg ) on day 15. (Patient not taking: Reported on 05/13/2020), Disp: 3 each, Rfl: 0    ALL:  No Known Allergies    SH:  Social History     Tobacco Use   ??? Smoking status: Former Smoker     Packs/day: 1.00     Years: 14.00     Pack years: 14.00     Types: Cigarettes     Quit date: 11/08/2003     Years since quitting: 17.1   ??? Smokeless tobacco: Never Used   ??? Tobacco comment: Quit smoking 2005   Vaping Use   ??? Vaping Use: Never used   Substance Use Topics   ??? Alcohol use: Not Currently   ??? Drug use: Never       FH:  Family History   Problem Relation Age of Onset   ??? Cancer Mother    ??? Hypertension Mother         Everyone on mother's side of family   ??? Cancer Maternal Aunt    ??? Cancer Maternal Uncle    ??? Early death Maternal Uncle         Heart Attack   ??? Aneurysm Maternal Uncle    ??? Stroke Maternal Grandmother    ??? Melanoma Neg Hx    ???  Basal cell carcinoma Neg Hx    ??? Squamous cell carcinoma Neg Hx        VITALS:  Vitals:    01/02/21 1200   BP: 150/85   Pulse: 90   Resp: 16   Temp: 36.6 ??C (97.9 ??F)   SpO2: 97%     Body mass index is 34.21 kg/m??.    Physical Exam  Constitutional: Alert and oriented. Well appearing and in no acute distress.  Eyes: Conjunctivae are normal.   ENT       Head: Normocephalic and atraumatic.  Cardiovascular: Normal rate, regular rhythm. Grossly normal heart sounds.  Good peripheral circulation.  Respiratory: Normal respiratory effort without tachypnea nor retractions. No wheezes, rales, rhonchi.  Gastrointestinal: Soft and nontender. No CVA tenderness.  Musculoskeletal:  Ambulatory with steady gait.   Chest Mid sternal to right midsternal at approximately 4/5 ribs tenderness to direct palpation, per patient pain is fully reproducible by direct palpation.  Pain to midsternal also present with the right arm stretching or cross body and lifting above head, as well as leaning forward with back arched.  No palpable rib fracture.  No rash or erythema to chest.  Back nontender.  No paresthesias.  Neurologic:  Normal speech and language.   Skin:  Skin is warm, dry and intact. No rash noted.  Psychiatric: Mood and affect are normal. Speech and behavior are normal. Patient exhibits appropriate insight and judgment    TEST  RESULTS:    No results found for this visit on 01/02/21.    SCREENINGS:  SDOH:   Social History     Tobacco Use   ??? Smoking status: Former Smoker     Packs/day: 1.00     Years: 14.00     Pack years: 14.00     Types: Cigarettes     Quit date: 11/08/2003 Years since quitting: 17.1   ??? Smokeless tobacco: Never Used   ??? Tobacco comment: Quit smoking 2005   Vaping Use   ??? Vaping Use: Never used   Substance Use Topics   ??? Alcohol use: Not Currently   ??? Drug use: Never     Former smoker  No SDOH interventions needed at today's visit.    DEPRESSION:    PHQ-2 Score: 0    PHQ-9 Score:      Edinburgh Score:      Screening complete, no depression identified / no further action needed today    EKG  84 bpm  Normal sinus rhythm  Inferior infarct, age undetermined  No ST elevation    Compared to previous EKG 06/11/2016 without change noted.  Appears consistent with previous EKG.      Radiology  Reading Physician Reading Date Result Priority   Ilona Sorrel, MD  [phone] 01/02/2021      Narrative & Impression  EXAM: XR CHEST 2 VIEWS  DATE: 01/02/2021 12:56 PM  ACCESSION: [id number]  DICTATED: 01/02/2021 1:06 PM  INTERPRETATION LOCATION: [facility]  ??  CLINICAL INDICATION: 50 years old Female with In Clinic ; CHEST PAIN  - R07.89 - Chest wall pain    ??  COMPARISON: August 30, 2012  ??  TECHNIQUE: PA and Lateral Chest Radiographs. 2 images.  ??  FINDINGS:   ??  The cardiomediastinal silhouette demonstrates mild tortuosity the thoracic aorta and interval increase in size of the cardiomediastinal silhouette.  ??  No focal airspace consolidation or pleural effusion is seen. Mild prominence of the vascular and interstitial markings of lungs is noted.  ??  Degenerative osteophytosis noted in the spine. No acute osseous abnormality is seen.  ??  IMPRESSION:  ??  Interval increase in size of the cardiomediastinal silhouette, with borderline cardiac enlargement now present. Further evaluation with cardiac echo exam may be helpful for more definitive evaluation of heart size or possible pericardial effusion. Prominence of the interstitial markings of lungs suspicious for mild interstitial edema.             Specimen Collected: 01/02/21 13:06 Last Resulted: 01/02/21 13:10 Renford Dills, NP-C  [facility] [place]  ----------------------------------------------------------------  Note - This record has been created using [employer]. Chart creation errors have been sought, but may not always have been located. Such creation errors do not reflect on the standard of medical care.

## 2021-01-02 NOTE — Unmapped (Signed)
Go directly to emergency room at this time.

## 2021-01-03 NOTE — Unmapped (Signed)
[facility]  Emergency Department Provider Note    ED Clinical Impression     Final diagnoses:   Pericardial effusion (Primary)   Chest pain, unspecified type       Initial Impression, ED Course, Assessment and Plan     Impression: Heather Watts is a 50 y.o. female with a past medical history of HTN, HLD, and GERD who presents for further evaluation of right-mid chest wall pain which onset after she was accidentally elbowed in the region 2 days prior. She was instructed to present to the ED after XR chest at [facility] prior to arrival was notable for borderline cardiac enlargement and potential interstitial edema.     On exam, the patient is well-appearing and in no acute distress. Triage vitals are within normal limits. Exam significant for no lower extremity edema, clear breath sounds bilaterally, tenderness to palpation of the central chest wall, hidradenitis present in bilateral lower part of the breast with some bruising present    POCUS with evidence of small pericardial effusion without tamponade. Plan for repeat EKG, BNP, TSH, trop, hCG, and basic labs.    I have Watts suspicion for a physiologically relevant pericardial effusion, do not feel that this is likely a traumatic effusion.  Potentially could be residual effusion from prior COVID.  Also will consider causes such as new onset heart failure, thyroid abnormality, cardiac cause.    ED Course as of 01/03/21 0107   Sat Jan 02, 2021   1959 Labs all reassuring. Trop, BNP negative.   2045 Patient's lab work is all reassuring, discussed with her that she will need to be seen by cardiology (referral was placed) for follow-up for pericardial effusion.  Given no signs of tamponade, normal vitals, feel that she is safe for discharge at this time.  Suspect the chest pain is more muscle skeletal in secondary to the tamponade.       Additional Medical Decision Making     I have reviewed the vital signs and the nursing notes. Labs and radiology results that were available during my care of the patient were independently reviewed by me and considered in my medical decision making.     I staffed the case with the ED attending, Dr. Ezekiel Slocumb.    I independently visualized the EKG tracing.   I independently visualized the radiology images.   I reviewed the patient's prior medical records that were available for viewing (Urgent Care 01/02/21).     Portions of this record have been created using [employer], clinical (histocompatibility and immunogenetics). Dictation errors have been sought, but may not have been identified and corrected.  ____________________________________________    History     Chief Complaint  Chest Wall Pain      HPI   Heather Watts is a 50 y.o. female with a past medical history of HTN, HLD, and GERD who presents for evaluation of chest wall pain. The patient states that 2 days ago, she was wrestling with her daughter when she experienced onset of chest wall pain after she was accidentally elbowed in the region. Since then, she has experienced persistent, dull pain to her right and mid-chest. The patient's pain is worsened upon laying flat or bending forward and is generally absent when sitting up straight. The patient was evaluated at [facility] prior to arrival, where her XR chest was significant for Interval increase in size of the cardiomediastinal silhouette, with borderline cardiac enlargement now present. Prominence of the interstitial markings of lungs suspicious for  mild interstitial edema. She was instructed to present to the ED for further evaluation given this result. No personal hx of blood clot or lupus. No personal cardiac hx. The patient was previously infected with COVID-19 in early 01/22. She denies regular alcohol use. She denies shortness of breath, leg swelling, leg pain, fever, chills, nausea, vomiting, cough, diarrhea, or rhinorrhea.       Past Medical History:   Diagnosis Date   ??? GERD (gastroesophageal reflux disease)    ??? Hyperlipidemia    ??? Hypertension        Patient Active Problem List   Diagnosis   ??? Essential hypertension   ??? Hyperlipidemia   ??? Grouped skin lesions       No past surgical history on file.    No current facility-administered medications for this encounter.    Current Outpatient Medications:   ???  cyclobenzaprine (FLEXERIL) 5 MG tablet, Take 1-2 tabs po tid prn (Patient not taking: Reported on 01/02/2021), Disp: 30 tablet, Rfl: 0  ???  doxycycline (VIBRA-TABS) 100 MG tablet, Take one tablet by mouth twice daily. (Patient not taking: Reported on 05/13/2020), Disp: 60 tablet, Rfl: 0  ???  empty container Misc, Use as directed to dispose of Humira pens., Disp: 1 each, Rfl: 2  ???  fluticasone propionate (FLONASE) 50 mcg/actuation nasal spray, 1 spray by Each Nare route once as needed for rhinitis., Disp: 16 g, Rfl: 2  ???  HUMIRA PEN CITRATE FREE 40 MG/0.4 ML, Inject the contents of 1 pen (40 mg total) under the skin every seven (7) days., Disp: 4 each, Rfl: 11  ???  HUMIRA PEN CITRATE FREE STARTER PACK FOR CROHN'S/UC/HS 3 X 80 MG/0.8 ML, Inject the contents of 2 pens (160mg ) under the skin on day 1, then inject the contents of 1 pen (80mg ) on day 15. (Patient not taking: Reported on 05/13/2020), Disp: 3 each, Rfl: 0  ???  hydroCHLOROthiazide (HYDRODIURIL) 25 MG tablet, Take 1 tablet (25 mg total) by mouth daily. TAKE 1 TABLET(25 MG) BY MOUTH DAILY, Disp: 90 tablet, Rfl: 1  ???  metoprolol succinate (TOPROL-XL) 25 MG 24 hr tablet, Take 1 tablet (25 mg total) by mouth Two (2) times a day., Disp: 180 tablet, Rfl: 1  ???  omeprazole (PRILOSEC) 40 MG capsule, Take 40 mg by mouth daily. , Disp: , Rfl:   ???  spironolactone (ALDACTONE) 100 MG tablet, Take 1 tablet (100 mg total) by mouth daily., Disp: 30 tablet, Rfl: 11    Allergies  Patient has no known allergies.    Family History   Problem Relation Age of Onset   ??? Cancer Mother    ??? Hypertension Mother         Everyone on mother's side of family   ??? Cancer Maternal Aunt    ??? Cancer Maternal Uncle    ??? Early death Maternal Uncle         Heart Attack   ??? Aneurysm Maternal Uncle    ??? Stroke Maternal Grandmother    ??? Melanoma Neg Hx    ??? Basal cell carcinoma Neg Hx    ??? Squamous cell carcinoma Neg Hx        Social History  Social History     Tobacco Use   ??? Smoking status: Former Smoker     Packs/day: 1.00     Years: 14.00     Pack years: 14.00     Types: Cigarettes     Quit date: 11/08/2003  Years since quitting: 17.1   ??? Smokeless tobacco: Never Used   ??? Tobacco comment: Quit smoking 2005   Vaping Use   ??? Vaping Use: Never used   Substance Use Topics   ??? Alcohol use: Not Currently   ??? Drug use: Never     Review of Systems  Constitutional: Negative for fever.  Eyes: Negative for visual changes.  ENT: Negative for sore throat.  CV: Positive for chest wall pain.  Resp: Negative for shortness of breath.  GI: Negative for abdominal pain.  GU: Negative for dysuria.  MSK: Negative for back pain.  Derm: Negative for rash.  Neuro: Negative for headaches.    Physical Exam     ED Triage Vitals [01/02/21 1351]   Enc Vitals Group      BP 125/101      Heart Rate 88      SpO2 Pulse       Resp 18      Temp 36.8 ??C (98.2 ??F)      Temp Source Oral      SpO2 99 %     Constitutional: Appears stated age. Well appearing.   HEENT: Normocephalic and atraumatic.Conjunctivae clear. No congestion. Moist mucous membranes.   Heme/Lymph/Immuno: No petechiae or bruising  CV: RRR, no murmurs.   Resp: Clear to auscultation bilaterally. No wheezes or rhonchii  GI: Soft and non tender, non distended.   MSK: Normal range of motion in all extremities.  Neuro: Normal speech and language. No gross focal neurologic deficits appreciated.  Skin: Warm, dry and intact.  Psychiatric: Mood and affect are normal. Speech and behavior are normal.    EKG     Normal sinus rhythm 83 bpm, normal axis, normal intervals, no ST elevation or depressions, no electrical alternans    Radiology     ED POCUS Chest Bilateral    (Results Pending)          I personally reviewed the images and lab results for the patient.     _____________________________________________________  Documentation assistance was provided by Bryna Colander, Scribe on January 02, 2021 at 5:38 PM for [facility], DO.     January 03, 2021 1:05 AM. Documentation assistance provided by the scribe. I was present during the time the encounter was recorded. The information recorded by the scribe was done at my direction and has been reviewed and validated by me. Rydan Gulyas, DO       Alfredia Ferguson, MD  Resident  01/03/21 [phone]

## 2021-01-14 NOTE — Unmapped (Incomplete)
Patient Education        Well Visit, Ages 63 to 37: Care Instructions  Overview     Well visits can help you stay healthy. Your doctor has checked your overall health and may have suggested ways to take good care of yourself. Your doctor also may have recommended tests. At home, you can help prevent illness with healthy eating, regular exercise, and other steps.  Follow-up care is a key part of your treatment and safety. Be sure to make and go to all appointments, and call your doctor if you are having problems. It's also a good idea to know your test results and keep a list of the medicines you take.  How can you care for yourself at home?  ?? Get screening tests that you and your doctor decide on. Screening helps find diseases before any symptoms appear.  ?? Eat healthy foods. Choose fruits, vegetables, whole grains, protein, and low-fat dairy foods. Limit fat, especially saturated fat. Reduce salt in your diet.  ?? Limit alcohol. If you are a man, have no more than 2 drinks a day or 14 drinks a week. If you are a woman, have no more than 1 drink a day or 7 drinks a week.  ?? Get at least 30 minutes of physical activity on most days of the week. Walking is a good choice. You also may want to do other activities, such as running, swimming, cycling, or playing tennis or team sports. Discuss any changes in your exercise program with your doctor.  ?? Reach and stay at a healthy weight. This will lower your risk for many problems, such as obesity, diabetes, heart disease, and high blood pressure.  ?? Do not smoke or allow others to smoke around you. If you need help quitting, talk to your doctor about stop-smoking programs and medicines. These can increase your chances of quitting for good.  ?? Care for your mental health. It is easy to get weighed down by worry and stress. Learn strategies to manage stress, like deep breathing and mindfulness, and stay connected with your family and community. If you find you often feel sad or hopeless, talk with your doctor. Treatment can help.  ?? Talk to your doctor about whether you have any risk factors for sexually transmitted infections (STIs). You can help prevent STIs if you wait to have sex with a new partner (or partners) until you've each been tested for STIs. It also helps if you use condoms (female or female condoms) and if you limit your sex partners to one person who only has sex with you. Vaccines are available for some STIs, such as HPV.  ?? Use birth control if it's important to you to prevent pregnancy. Talk with your doctor about the choices available and what might be best for you.  ?? If you think you may have a problem with alcohol or drug use, talk to your doctor. This includes prescription medicines (such as amphetamines and opioids) and illegal drugs (such as cocaine and methamphetamine). Your doctor can help you figure out what type of treatment is best for you.  ?? Protect your skin from too much sun. When you're outdoors from 10 a.m. to 4 p.m., stay in the shade or cover up with clothing and a hat with a wide brim. Wear sunglasses that block UV rays. Even when it's cloudy, put broad-spectrum sunscreen (SPF 30 or higher) on any exposed skin.  ?? See a dentist one or two times a year  for checkups and to have your teeth cleaned.  ?? Wear a seat belt in the car.  When should you call for help?  Watch closely for changes in your health, and be sure to contact your doctor if you have any problems or symptoms that concern you.  Where can you learn more?  Go to [facility] at [email or web address]  [facility] in the Menu. Enter P072 in the search box to learn more about Well Visit, Ages 18 to 50: Care Instructions.  Current as of: August 12, 2020??????????????????????????????Content Version: 13.1  ?? 2006-2021 [employer].   Care instructions adapted under license by [facility]. If you have questions about a medical condition or this instruction, always ask your healthcare professional. [employer] disclaims any warranty or liability for your use of this information.

## 2021-01-14 NOTE — Unmapped (Unsigned)
Assessment and Plan:     Diagnoses and all orders for this visit:    Screening for cervical cancer      BP {at goal/not at ZOXW:96045} (***/*** in clinic today). Continue HCTZ 25 mg daily and metoprolol 25 mg BID***. Reviewed low sodium diet and encouraged regular exercise. Advised to continue to monitor and log at-home BP readings.    Barriers to goals identified and addressed. Pertinent handouts were given today and reviewed with the patient as indicated.  The Care Plan and Self-Management goals have been included on the AVS and the AVS has been printed.  I encouraged the patient to keep regular logs for me to review at their next visit. Any outside resources or referrals needed at this time are noted above. Patient's current medications have been reviewed. Any new medications prescribed have been discussed, and side effects have been addressed.  Have assessed the patient's understanding, response, and barriers to adherence to medications. Patient voiced understanding and all questions have been answered to satisfaction.     No follow-ups on file.    Subjective:      Heather Watts 50 y.o.female is being seen for a health maintenance and gynecological exam. Last pap smear was >20 years ago with a normal pap, but notable for trichomonas. No HPV cotest.  Patient's last menstrual period was 12/28/2020 (approximate).    No chief complaint on file.    Lifestyle:  Employment:  Exercise:  Diet:  Caffeine use:   Last Dental:  Last Vision:    HPI:    Hypertension: Patient presents for follow-up of hypertension. Blood pressure goal < 140/90.  Hypertension has customarily {been/notbeen:38678} at goal complicated by ***.  Home blood pressure readings: {home bp readings:17448}. Salt intake and diet: {salt intake/diet:17449}. Associated signs and symptoms: {symptoms hypertension:17452}. Patient denies: {symptoms hypertension:19611}. Medication compliance: {med compliance:10573}. She {is/is not:23060} doing regular exercise.          ROS:     Gen:  No fever, excessive fatigue, weight loss or gain***  EYES: No eye pain,eyesight problems,eye discharge***  ENT:  No chronic sore throat, ear pain, hearing loss, hoarseness, nosebleeds***  CV: No chest pain, palpitations, claudication, edema***  RESP: No SOB, chronic cough,wheezing ,DOE, orthopnea, PND***  GI: No abdominal pain, swallowing problems, heartburn, nausea, constipation, diarrhea, rectal bleeding***  GU: No dysuria, incontinence, abnormal vaginal bleeding, unusual vaginal d/c, pelvic pain, breast pain or masses***  MS: No arthralgias, myalgias, joint swelling***  SKIN:  No rashes, changing skin lesions, abnormal bruising or bleeding***  PSYCH: No sleep problems, anxiety, depression, abnormal thoughts***  NEURO: No chronic headaches, numbness, confusion, difficulty walking, dizziness***  Reproductive health:  Period Regularity: ***  Contraception: ***    Social History:     Social History     Tobacco Use   ??? Smoking status: Former Smoker     Packs/day: 1.00     Years: 14.00     Pack years: 14.00     Types: Cigarettes     Quit date: 11/08/2003     Years since quitting: 17.1   ??? Smokeless tobacco: Never Used   ??? Tobacco comment: Quit smoking 2005   Vaping Use   ??? Vaping Use: Never used   Substance Use Topics   ??? Alcohol use: Not Currently   ??? Drug use: Never       Past Medical/Surgical History:     Past Medical History:   Diagnosis Date   ??? GERD (gastroesophageal reflux disease)    ???  Hyperlipidemia    ??? Hypertension      No past surgical history on file.     Family History:     Family History   Problem Relation Age of Onset   ??? Cancer Mother    ??? Hypertension Mother         Everyone on mother's side of family   ??? Cancer Maternal Aunt    ??? Cancer Maternal Uncle    ??? Early death Maternal Uncle         Heart Attack   ??? Aneurysm Maternal Uncle    ??? Stroke Maternal Grandmother    ??? Melanoma Neg Hx    ??? Basal cell carcinoma Neg Hx    ??? Squamous cell carcinoma Neg Hx        Allergies: Patient has no known allergies.    Current Medications:     Current Outpatient Medications   Medication Sig Dispense Refill   ??? cyclobenzaprine (FLEXERIL) 5 MG tablet Take 1-2 tabs po tid prn (Patient not taking: Reported on 01/02/2021) 30 tablet 0   ??? doxycycline (VIBRA-TABS) 100 MG tablet Take one tablet by mouth twice daily. (Patient not taking: Reported on 05/13/2020) 60 tablet 0   ??? empty container Misc Use as directed to dispose of Humira pens. 1 each 2   ??? fluticasone propionate (FLONASE) 50 mcg/actuation nasal spray 1 spray by Each Nare route once as needed for rhinitis. 16 g 2   ??? HUMIRA PEN CITRATE FREE 40 MG/0.4 ML Inject the contents of 1 pen (40 mg total) under the skin every seven (7) days. 4 each 11   ??? HUMIRA PEN CITRATE FREE STARTER PACK FOR CROHN'S/UC/HS 3 X 80 MG/0.8 ML Inject the contents of 2 pens (160mg ) under the skin on day 1, then inject the contents of 1 pen (80mg ) on day 15. (Patient not taking: Reported on 05/13/2020) 3 each 0   ??? hydroCHLOROthiazide (HYDRODIURIL) 25 MG tablet Take 1 tablet (25 mg total) by mouth daily. TAKE 1 TABLET(25 MG) BY MOUTH DAILY 90 tablet 1   ??? metoprolol succinate (TOPROL-XL) 25 MG 24 hr tablet Take 1 tablet (25 mg total) by mouth Two (2) times a day. 180 tablet 1   ??? omeprazole (PRILOSEC) 40 MG capsule Take 40 mg by mouth daily.      ??? spironolactone (ALDACTONE) 100 MG tablet Take 1 tablet (100 mg total) by mouth daily. 30 tablet 11     No current facility-administered medications for this visit.       Health Maintenance:     Health Maintenance   Topic Date Due   ??? Pap Smear (21-65)  09/07/2003   ??? COVID-19 Vaccine (2 - Pfizer 3-dose series) 07/11/2020   ??? Serum Creatinine Monitoring  01/02/2022   ??? Potassium Monitoring  01/02/2022   ??? Lipid Screening  08/15/2024   ??? DTaP/Tdap/Td Vaccines (4 - Td or Tdap) 07/04/2028   ??? Hepatitis C Screen  Completed   ??? Influenza Vaccine  Completed       Immunizations:     Immunization History   Administered Date(s) Administered   ??? COVID-19 VACC,MRNA,(PFIZER)(PF)(IM) 06/20/2020   ??? Hepatitis B, Adult 07/04/2018, 08/01/2018, 12/06/2018   ??? INFLUENZA TIV (TRI) PF (IM) 12/09/2009   ??? Influenza Vaccine Quad (IIV4 PF) 28mo+ injectable 08/15/2018, 08/13/2019   ??? Influenza Virus Vaccine, unspecified formulation 08/15/2018, 08/13/2019, 08/22/2020   ??? MMR 05/26/2008, 07/11/2008   ??? TdaP 05/14/2008   ??? Tetanus and diptheria, adsorbed,(adult), 5Lf tetanus  toxoid,PF 07/11/2008, 07/04/2018     I have reviewed and (if needed) updated the patient's problem list, medications, allergies, past medical and surgical history, preventive services, and social and family history.    Vital Signs:     Wt Readings from Last 3 Encounters:   01/02/21 (!) 102.1 kg (225 lb)   02/04/20 (!) 108.9 kg (240 lb)   08/16/19 (!) 110.7 kg (244 lb)     Temp Readings from Last 3 Encounters:   01/02/21 36.7 ??C (98 ??F) (Oral)   01/02/21 36.6 ??C (97.9 ??F) (Temporal)   02/04/20 36.7 ??C (98.1 ??F) (Oral)     BP Readings from Last 3 Encounters:   01/02/21 141/93   01/02/21 150/85   02/04/20 134/88     Pulse Readings from Last 3 Encounters:   01/02/21 89   01/02/21 90   02/04/20 84      Estimated body mass index is 34.21 kg/m?? as calculated from the following:    Height as of 01/02/21: 172.7 cm (5' 8).    Weight as of 01/02/21: 102.1 kg (225 lb).  No height and weight on file for this encounter.      Objective:      General Appearance: Alert, cooperative, no distress, appears stated age.***  Head and Neck: Normocephalic, atraumatic. No  masses. No thyromegaly. No bruits.***  EENT: Eyes: PERRLA, Conjuntiva clear; Ears: bilateral TMs normal; Nose: septum and turbinates unremarkable; Throat: mucus membranes moist. No tonsilar enlargement or exudates. No OP erythema.***  Cardiovascular:  Regular rate and rhythm. Normal S1, S2. No murmurs, rubs or gallops.***  Respiratory: Lungs clear to auscultation bilaterally,  Respiratory effort unremarkable. No wheezes or rhonchi.***  Breast: Symmetrical. No masses or lumps. No nipple discharge. No abnormal axillary nodes ***  Gastrointestinal: Abdomen soft and non-tender. + BS, non-distended. No organomegaly or masses.***  Genitourinary: Normal female external genitalia. Urethra non-tender and without masses, lesions. Vagina normal in appearance, without discharge, lesions. No significant prolapse. Cervix without stenosis, CMT. Uterus mobile, normal in size and non-tender. No adnexal masses or TTP. ***  MSK: Gait and station unremarkable. Normal ROM major joints. Normal strength and tone of proximal muscles.***  Extremities: No edema. Peripheral pulses normal***  Integumentary: Warm and dry. No rashes. No abnormal appearing skin lesions***  LYMPH NODES: Cervical, supraclavicular, and axillary nodes normal***  Neurologic: Alert and oriented to place and time. Normal deep tendon reflexes. CN II-XII grossly intact.***  Psychiatric: Mood and affect appropriate for situation.***     Labs:     No results found for this visit on 01/19/21.    I attest that I, Bayard Hugger, personally documented this note while acting as scribe for Noralyn Pick, FNP.      Bayard Hugger, Scribe.  01/19/2021     The documentation recorded by the scribe accurately reflects the service I personally performed and the decisions made by me.    Noralyn Pick, FNP

## 2021-01-21 NOTE — Unmapped (Signed)
[facility] Refill Coordination Note    Specialty Medication(s) to be Shipped:   Inflammatory Disorders: Humira    Other medication(s) to be shipped: No additional medications requested for fill at this time     Clark Clowdus, DOB: 1971/03/05  Phone: [phone] (home)       All above HIPAA information was verified with patient.     Was a Nurse, learning disability used for this call? No    Completed refill call assessment today to schedule patient's medication shipment from the [facility] [phone]).       Specialty medication(s) and dose(s) confirmed: Regimen is correct and unchanged.   Changes to medications: Andelyn reports no changes at this time.  Changes to insurance: No  Questions for the pharmacist: No    Confirmed patient received Welcome Packet with first shipment. The patient will receive a drug information handout for each medication shipped and additional FDA Medication Guides as required.       DISEASE/MEDICATION-SPECIFIC INFORMATION        For patients on injectable medications: Patient currently has 2 doses left.  Next injection is scheduled for 01/21/2021.    SPECIALTY MEDICATION ADHERENCE     Medication Adherence    Patient reported X missed doses in the last month: 0  Specialty Medication: Humira Cf 40 mg/0.4 ml   Patient is on additional specialty medications: No  Any gaps in refill history greater than 2 weeks in the last 3 months: no  Demonstrates understanding of importance of adherence: yes  Informant: patient  Reliability of informant: reliable  Confirmed plan for next specialty medication refill: delivery by pharmacy  Refills needed for supportive medications: not needed                Humira 40/0.4 mg/ml: 14 days of medicine on hand          SHIPPING     Shipping address confirmed in Epic.     Delivery Scheduled: Yes, Expected medication delivery date: 01/28/2021.     Medication will be delivered via Same Day Courier to the prescription address in Epic WAM.    Mylinda Brook D Tonnette Zwiebel   [facility] Specialty Technician

## 2021-01-28 MED FILL — HUMIRA PEN CITRATE FREE 40 MG/0.4 ML: SUBCUTANEOUS | 28 days supply | Qty: 4 | Fill #8

## 2021-01-29 ENCOUNTER — Encounter: Admit: 2021-01-29 | Discharge: 2021-01-30 | Payer: PRIVATE HEALTH INSURANCE

## 2021-01-29 DIAGNOSIS — I313 Pericardial effusion (noninflammatory): Principal | ICD-10-CM

## 2021-01-29 DIAGNOSIS — I10 Essential (primary) hypertension: Principal | ICD-10-CM

## 2021-01-29 DIAGNOSIS — R079 Chest pain, unspecified: Principal | ICD-10-CM

## 2021-01-29 MED ORDER — AMLODIPINE 10 MG TABLET
ORAL_TABLET | Freq: Every day | ORAL | 3 refills | 90.00000 days | Status: CP
Start: 2021-01-29 — End: 2022-01-29

## 2021-01-29 MED ORDER — CHLORTHALIDONE 25 MG TABLET
ORAL_TABLET | Freq: Every day | ORAL | 6 refills | 30 days | Status: CP
Start: 2021-01-29 — End: 2021-02-28
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
## 2021-01-29 NOTE — Unmapped (Addendum)
It was nice meeting you today.     I will get an echocardiogram to look at the structure and function of your heart.     If there is anything concerning I will reach out to you.       For your blood pressure:  STOP the metoprolol and the hydrochlorothiazide  START chlorthalidone 25mg  daily   START amlodipine 5mg  once a day. Check your blood pressures at home. If they are consistently <130/90 then stay with 5mg . If after one week, the blood pressure is more than 130/90, then go ahead and increase the amlodipine to 10mg  once a day.

## 2021-02-12 LAB — BASIC METABOLIC PANEL
BLOOD UREA NITROGEN: 16 mg/dL (ref 6–24)
BUN / CREAT RATIO: 19 (ref 9–23)
CALCIUM: 10.2 mg/dL (ref 8.7–10.2)
CHLORIDE: 96 mmol/L (ref 96–106)
CO2: 28 mmol/L (ref 20–29)
CREATININE: 0.84 mg/dL (ref 0.57–1.00)
EGFR: 86 mL/min/{1.73_m2}
GLUCOSE: 114 mg/dL — ABNORMAL HIGH (ref 65–99)
POTASSIUM: 3 mmol/L — ABNORMAL LOW (ref 3.5–5.2)
SODIUM: 139 mmol/L (ref 134–144)

## 2021-02-13 MED ORDER — POTASSIUM CHLORIDE ER 20 MEQ TABLET,EXTENDED RELEASE(PART/CRYST)
ORAL_TABLET | Freq: Every day | ORAL | 2 refills | 60 days | Status: CP
Start: 2021-02-13 — End: 2022-02-13

## 2021-02-16 ENCOUNTER — Encounter: Admit: 2021-02-16 | Discharge: 2021-02-17 | Payer: PRIVATE HEALTH INSURANCE

## 2021-02-19 NOTE — Unmapped (Signed)
[facility] Refill Coordination Note    Specialty Medication(s) to be Shipped:   Inflammatory Disorders: Humira    Other medication(s) to be shipped: No additional medications requested for fill at this time     Heather Watts, DOB: [birth year]  Phone: [phone] (home)       All above HIPAA information was verified with patient.     Was a Nurse, learning disability used for this call? No    Completed refill call assessment today to schedule patient's medication shipment from the [facility] [phone]).  All relevant notes have been reviewed.     Specialty medication(s) and dose(s) confirmed: Regimen is correct and unchanged.   Changes to medications: Heather Watts reports stopping the following medications: Metoprolol and HCTZ. Started Amlodipine 5mg  and Chlorthalidone 25mg  both once a day  Changes to insurance: No  New side effects reported not previously addressed with a pharmacist or physician: None reported  Questions for the pharmacist: No    Confirmed patient received a Conservation officer, historic buildings and a Surveyor, mining with first shipment. The patient will receive a drug information handout for each medication shipped and additional FDA Medication Guides as required.       DISEASE/MEDICATION-SPECIFIC INFORMATION        For patients on injectable medications: Patient currently has 0 doses left.  Next injection is scheduled for 02/25/2021.    SPECIALTY MEDICATION ADHERENCE     Medication Adherence    Patient reported X missed doses in the last month: 0  Specialty Medication: Humira Cf 40 mg/0.4 ml  Patient is on additional specialty medications: No  Any gaps in refill history greater than 2 weeks in the last 3 months: no  Demonstrates understanding of importance of adherence: yes  Informant: patient  Reliability of informant: reliable  Confirmed plan for next specialty medication refill: delivery by pharmacy  Refills needed for supportive medications: not needed              Were doses missed due to medication being on hold? No    Humira CF 40/0.4 mg/ml: 7 days of medicine on hand       REFERRAL TO PHARMACIST     Referral to the pharmacist: Not needed      [facility]     Shipping address confirmed in Epic.     Delivery Scheduled: Yes, Expected medication delivery date: 02/25/2021.     Medication will be delivered via Same Day Courier to the prescription address in Epic WAM.    Heather Watts   [facility] Specialty Technician

## 2021-02-24 ENCOUNTER — Telehealth: Admit: 2021-02-24 | Discharge: 2021-02-25 | Payer: PRIVATE HEALTH INSURANCE

## 2021-02-24 DIAGNOSIS — J029 Acute pharyngitis, unspecified: Principal | ICD-10-CM

## 2021-02-24 DIAGNOSIS — R0981 Nasal congestion: Principal | ICD-10-CM

## 2021-02-24 DIAGNOSIS — E876 Hypokalemia: Principal | ICD-10-CM

## 2021-02-24 MED ORDER — AMOXICILLIN 875 MG TABLET
ORAL_TABLET | Freq: Two times a day (BID) | ORAL | 0 refills | 7 days | Status: CP
Start: 2021-02-24 — End: ?

## 2021-02-24 MED ORDER — FLUTICASONE PROPIONATE 50 MCG/ACTUATION NASAL SPRAY,SUSPENSION
Freq: Every day | NASAL | 0 refills | 0.00000 days | Status: CP
Start: 2021-02-24 — End: 2022-02-24

## 2021-02-24 NOTE — Unmapped (Signed)
Patient Education      Patient Education        Sinusitis: Care Instructions  Your Care Instructions     Sinusitis is an infection of the lining of the sinus cavities in your head. Sinusitis often follows a cold. It causes pain and pressure in your head and face.  In most cases, sinusitis gets better on its own in 1 to 2 weeks. But some mild symptoms may last for several weeks. Sometimes antibiotics are needed.  Follow-up care is a key part of your treatment and safety. Be sure to make and go to all appointments, and call your doctor if you are having problems. It's also a good idea to know your test results and keep a list of the medicines you take.  How can you care for yourself at home?  ?? Take an over-the-counter pain medicine, such as acetaminophen (Tylenol), ibuprofen (Advil, Motrin), or naproxen (Aleve). Read and follow all instructions on the label.  ?? If the doctor prescribed antibiotics, take them as directed. Do not stop taking them just because you feel better. You need to take the full course of antibiotics.  ?? Be careful when taking over-the-counter cold or flu medicines and Tylenol at the same time. Many of these medicines have acetaminophen, which is Tylenol. Read the labels to make sure that you are not taking more than the recommended dose. Too much acetaminophen (Tylenol) can be harmful.  ?? Breathe warm, moist air from a steamy shower, a hot bath, or a sink filled with hot water. Avoid cold, dry air. Using a humidifier in your home may help. Follow the directions for cleaning the machine.  ?? Use saline (saltwater) nasal washes. This can help keep your nasal passages open and wash out mucus and bacteria. You can buy saline nose drops at a grocery store or drugstore. Or you can make your own at home by adding 1 teaspoon of salt and 1 teaspoon of baking soda to 2 cups of distilled water. If you make your own, fill a bulb syringe with the solution, insert the tip into your nostril, and squeeze gently. Blow your nose.  ?? Put a hot, wet towel or a warm gel pack on your face 3 or 4 times a day for 5 to 10 minutes each time.  ?? Try a decongestant nasal spray like oxymetazoline (Afrin). Do not use it for more than 3 days in a row. Using it for more than 3 days can make your congestion worse.  When should you call for help?   Call your doctor now or seek immediate medical care if:  ?? ?? You have new or worse swelling or redness in your face or around your eyes.   ?? ?? You have a new or higher fever.   Watch closely for changes in your health, and be sure to contact your doctor if:  ?? ?? You have new or worse facial pain.   ?? ?? The mucus from your nose becomes thicker (like pus) or has new blood in it.   ?? ?? You are not getting better as expected.   Where can you learn more?  Go to [facility] at [email or web address] in the Menu. Enter [phone] in the search box to learn more about Sinusitis: Care Instructions.  Current as of: July 15, 2020??????????????????????????????Content Version: 13.2  ?? 2006-2022 [employer].   Care instructions adapted under license by [facility]. If you have questions about a medical  condition or this instruction, always ask your healthcare professional. [employer]wise, Inc[employer] disclaims any warranty or liability for your use of this information.         Sore Throat: Care Instructions  Overview     Infection by bacteria or a virus causes most sore throats. Cigarette smoke, dry air, air pollution, allergies, and yelling can also cause a sore throat. Sore throats can be painful and annoying. Fortunately, most sore throats go away on their own. If you have a bacterial infection, your doctor may prescribe antibiotics.  Follow-up care is a key part of your treatment and safety. Be sure to make and go to all appointments, and call your doctor if you are having problems. It's also a good idea to know your test results and keep a list of the medicines you take.  How can you care for yourself at home?  ?? If your doctor prescribed antibiotics, take them as directed. Do not stop taking them just because you feel better. You need to take the full course of antibiotics.  ?? Gargle with warm salt water several times a day to help reduce swelling and relieve pain. Mix 1/2 teaspoon of salt in 1 cup of warm water.  ?? Take an over-the-counter pain medicine, such as acetaminophen (Tylenol), ibuprofen (Advil, Motrin), or naproxen (Aleve). Read and follow all instructions on the label.  ?? Be careful when taking over-the-counter cold or flu medicines and Tylenol at the same time. Many of these medicines have acetaminophen, which is Tylenol. Read the labels to make sure that you are not taking more than the recommended dose. Too much acetaminophen (Tylenol) can be harmful.  ?? Drink plenty of fluids. Fluids may help soothe an irritated throat. Hot fluids, such as tea or soup, may help decrease throat pain.  ?? Use over-the-counter throat lozenges to soothe pain. Regular cough drops or hard candy may also help. These should not be given to young children because of the risk of choking.  ?? Do not smoke or allow others to smoke around you. If you need help quitting, talk to your doctor about stop-smoking programs and medicines. These can increase your chances of quitting for good.  ?? Use a vaporizer or humidifier to add moisture to your bedroom. Follow the directions for cleaning the machine.  When should you call for help?   Call your doctor now or seek immediate medical care if:  ?? ?? You have trouble breathing.   ?? ?? Your sore throat gets much worse on one side.   ?? ?? You have new or worse trouble swallowing.   ?? ?? You have a new or higher fever.   Watch closely for changes in your health, and be sure to contact your doctor if you do not get better as expected.  Where can you learn more?  Go to [facility] at [email or web address] in the Menu. Enter [phone] in the search box to learn more about Sore Throat: Care Instructions.  Current as of: July 15, 2020??????????????????????????????Content Version: 13.2  ?? 2006-2022 Healthwise, Inc[employer].   Care instructions adapted under license by [facility] If you have questions about a medical condition or this instruction, always ask your healthcare professional. Health[employer], Incorporated disclaims any warranty or liability for your use of this information.

## 2021-02-24 NOTE — Unmapped (Signed)
[facility] ENT Encounter  This medical encounter was conducted virtually using [email or web address]  [email or web address] protocols.    Patient ID: Heather Watts is a 50 y.o. female who presents by video interaction for evaluation.    I have identified myself to the patient and conveyed my credentials to [name].   Patient has signed informed consent on file in medical record.    Present on Video Call: Is there someone else in the room? No..    Assessment/Plan:    Diagnoses and all orders for this visit:    Sore throat  -     amoxicillin (AMOXIL) 875 MG tablet; Take 1 tablet (875 mg total) by mouth every twelve (12) hours.    Nasal congestion  -     amoxicillin (AMOXIL) 875 MG tablet; Take 1 tablet (875 mg total) by mouth every twelve (12) hours.     Precautions given.   Continue flonase and advil prn.     -- Discussed the new prescription noted above, including potential side effects, drug interactions, instructions for taking the medication, and the consequences of not taking it.  -- Patient verbalized an understanding of today's assessment and recommendations, as well as the purpose of ongoing medications.    Referred back to PCP for follow up in 7 days prn    Medication adherence and barriers to the treatment plan have been addressed. Opportunities to optimize healthy behaviors have been discussed. Patient / caregiver voiced understanding.        Subjective:     HPI  Heather Watts is 50 y.o. and presents today in the [facility] with ENT symptoms.  The PCP for this patient is Noralyn Pick, FNP. Pt with 4+ days of sinus congestion and sore throat with post nasal drip.  No cough or sob.  Pain in right ear and right side of throat for two days.  No fever.  Unable to see throat b/c of enlarged tonsils per patient no change for her.          ROS  Review of Systems     All other ROS per HPI.    I have reviewed the problem list, past medical history, past family history, medications, and allergies and have updated/reconciled them if needed.          Objective:   Physical Exam  As part of this Video Visit, no in-person exam was conducted.  Video interaction permitted the following observations.    General: No acute distress.   HEENT: OP clear.  Uvula midline.  No pain on provider guided self-palpation/manipulation of external ear.  No visible mass or abnormality of neck.   RESP: Relaxed respiratory effort. No conversational dyspnea.   SKIN: No rashes noted.  NEURO: Normal coordination.  No tremors observed.  PSYCH: Alert and oriented.  Speech fluent and sensible.  Calm affect.             The patient reports they are currently: at home. I spent 14 minutes on the real-time audio and video with the patient on the date of service. I spent an additional 8 minutes on pre- and post-visit activities on the date of service.     The patient was physically located in [place] or a state in which I am permitted to provide care. The patient and/or parent/guardian understood that s/he may incur co-pays and cost sharing, and agreed to the telemedicine visit. The visit was reasonable and appropriate under the circumstances given  the patient's presentation at the time.    The patient and/or parent/guardian has been advised of the potential risks and limitations of this mode of treatment (including, but not limited to, the absence of in-person examination) and has agreed to be treated using telemedicine. The patient's/patient's family's questions regarding telemedicine have been answered.     If the visit was completed in an ambulatory setting, the patient and/or parent/guardian has also been advised to contact their provider???s office for worsening conditions, and seek emergency medical treatment and/or call 911 if the patient deems either necessary.

## 2021-02-25 MED FILL — HUMIRA PEN CITRATE FREE 40 MG/0.4 ML: SUBCUTANEOUS | 28 days supply | Qty: 4 | Fill #9

## 2021-03-04 LAB — BASIC METABOLIC PANEL
BLOOD UREA NITROGEN: 13 mg/dL (ref 6–24)
BUN / CREAT RATIO: 15 (ref 9–23)
CALCIUM: 10.1 mg/dL (ref 8.7–10.2)
CHLORIDE: 100 mmol/L (ref 96–106)
CO2: 26 mmol/L (ref 20–29)
CREATININE: 0.89 mg/dL (ref 0.57–1.00)
EGFR: 80 mL/min/{1.73_m2}
GLUCOSE: 99 mg/dL (ref 65–99)
POTASSIUM: 3.5 mmol/L (ref 3.5–5.2)
SODIUM: 142 mmol/L (ref 134–144)

## 2021-03-04 LAB — MAGNESIUM: MAGNESIUM: 2 mg/dL (ref 1.6–2.3)

## 2021-03-24 NOTE — Unmapped (Unsigned)
TeleHealth Video Encounter  This visit is conducted via [employer], applications.    Patient is currently located in the state of [place].  I have identified myself to the patient and conveyed my credentials to [name]  I have explained the capabilities and limitations of telemedicine and the patient and myself both agree that it is appropriate for their current circumstances/symptoms.  In case we get disconnected, patient's phone number is [phone] (home)    Patient has signed informed consent on file in medical record.  Is there someone else in the room? {televisitvisitor:66677}  I have informed patient that medical scribe is present during this video encounter.     Assessment and Plan:   There are no diagnoses linked to this encounter.    No follow-ups on file.    Total time spent with patient:   {    Coding tips - Do not edit this text, it will delete upon signing of note!    ?? Telephone visits [phone] for Physicians and APP??s and [phone] for Non- Physician Clinicians)- Only use minutes on the phone to determine level of service.    ?? Video visits [phone] - Use both minutes on video and pre/post minutes to determine level of service.       :75688}    The patient reports they are currently: {patient location:[address]90}. I spent *** minutes on the {phone audio video visit:67489} with the patient on the date of service. I spent an additional *** minutes on pre- and post-visit activities on the date of service.     The patient was physically located in [place] or a state in which I am permitted to provide care. The patient and/or parent/guardian understood that s/he may incur co-pays and cost sharing, and agreed to the telemedicine visit. The visit was reasonable and appropriate under the circumstances given the patient's presentation at the time.    The patient and/or parent/guardian has been advised of the potential risks and limitations of this mode of treatment (including, but not limited to, the absence of in-person examination) and has agreed to be treated using telemedicine. The patient's/patient's family's questions regarding telemedicine have been answered.     If the visit was completed in an ambulatory setting, the patient and/or parent/guardian has also been advised to contact their provider???s office for worsening conditions, and seek emergency medical treatment and/or call 911 if the patient deems either necessary.          Subjective:       Heather Watts is a 50 y.o. female who presents for No chief complaint on file.      {kerichronicdx:74702}    {keriacutedx:74703}         ROS:      Comprehensive 10 point ROS negative unless otherwise stated in the HPI.      [facility] Components:     Medication adherence and barriers to the treatment plan have been addressed. Opportunities to optimize healthy behaviors have been discussed. Patient / caregiver voiced understanding.      Past Medical/Surgical History:     Past Medical History:   Diagnosis Date   ??? GERD (gastroesophageal reflux disease)    ??? Hyperlipidemia    ??? Hypertension      No past surgical history on file.    Family History:     Family History   Problem Relation Age of Onset   ??? Cancer Mother    ??? Hypertension Mother  Everyone on mother's side of family   ??? Cancer Maternal Aunt    ??? Cancer Maternal Uncle    ??? Early death Maternal Uncle         Heart Attack   ??? Aneurysm Maternal Uncle    ??? Stroke Maternal Grandmother    ??? Melanoma Neg Hx    ??? Basal cell carcinoma Neg Hx    ??? Squamous cell carcinoma Neg Hx        Social History:     Social History     Tobacco Use   ??? Smoking status: Former Smoker     Packs/day: 1.00     Years: 14.00     Pack years: 14.00     Types: Cigarettes     Quit date: 11/08/2003     Years since quitting: 17.3   ??? Smokeless tobacco: Never Used   ??? Tobacco comment: Quit smoking 2005   Vaping Use   ??? Vaping Use: Never used   Substance Use Topics   ??? Alcohol use: Not Currently   ??? Drug use: Never Allergies:     Patient has no known allergies.    Current Medications:     Current Outpatient Medications   Medication Sig Dispense Refill   ??? amLODIPine (NORVASC) 10 MG tablet Take 1 tablet (10 mg total) by mouth daily. 90 tablet 3   ??? amoxicillin (AMOXIL) 875 MG tablet Take 1 tablet (875 mg total) by mouth every twelve (12) hours. 14 tablet 0   ??? chlorthalidone (HYGROTON) 25 MG tablet Take 1 tablet (25 mg total) by mouth daily. 30 tablet 6   ??? cyclobenzaprine (FLEXERIL) 5 MG tablet Take 1-2 tabs po tid prn 30 tablet 0   ??? empty container Misc Use as directed to dispose of Humira pens. 1 each 2   ??? fluticasone propionate (FLONASE) 50 mcg/actuation nasal spray 1 spray by Each Nare route once as needed for rhinitis. 16 g 2   ??? fluticasone propionate (FLONASE) 50 mcg/actuation nasal spray 1 spray into each nostril daily. 16 g 0   ??? HUMIRA PEN CITRATE FREE 40 MG/0.4 ML Inject the contents of 1 pen (40 mg total) under the skin every seven (7) days. 4 each 11   ??? HUMIRA PEN CITRATE FREE STARTER PACK FOR CROHN'S/UC/HS 3 X 80 MG/0.8 ML Inject the contents of 2 pens (160mg ) under the skin on day 1, then inject the contents of 1 pen (80mg ) on day 15. 3 each 0   ??? omeprazole (PRILOSEC) 40 MG capsule Take 40 mg by mouth daily.      ??? potassium chloride (KLOR-CON) 20 MEQ CR tablet Take 2 tablets (40 mEq total) by mouth daily. 120 tablet 2   ??? spironolactone (ALDACTONE) 100 MG tablet Take 1 tablet (100 mg total) by mouth daily. 30 tablet 11     No current facility-administered medications for this visit.       Health Maintenance:     Health Maintenance   Topic Date Due   ??? Pap Smear (21-65)  09/07/2003   ??? COVID-19 Vaccine (2 - Pfizer 3-dose series) 07/11/2020   ??? Serum Creatinine Monitoring  03/03/2022   ??? Potassium Monitoring  03/03/2022   ??? Lipid Screening  08/15/2024   ??? DTaP/Tdap/Td Vaccines (4 - Td or Tdap) 07/04/2028   ??? Hepatitis C Screen  Completed   ??? Influenza Vaccine  Completed   ??? Pneumococcal Vaccine 0-64 Aged Out       Immunizations:  Immunization History   Administered Date(s) Administered   ??? COVID-19 VACC,MRNA,(PFIZER)(PF)(IM) 06/20/2020   ??? Hepatitis B, Adult 07/04/2018, 08/01/2018, 12/06/2018   ??? INFLUENZA TIV (TRI) PF (IM) 12/09/2009   ??? Influenza Vaccine Quad (IIV4 PF) 37mo+ injectable 08/15/2018, 08/13/2019   ??? Influenza Virus Vaccine, unspecified formulation 08/15/2018, 08/13/2019, 08/22/2020   ??? MMR 05/26/2008, 07/11/2008   ??? TdaP 05/14/2008   ??? Tetanus and diptheria, adsorbed,(adult), 5Lf tetanus toxoid,PF 07/11/2008, 07/04/2018     I have reviewed and (if needed) updated the patient's problem list, medications, allergies, past medical and surgical history, social and family history.     Vital Signs:     Wt Readings from Last 3 Encounters:   01/29/21 (!) 110.7 kg (244 lb)   01/02/21 (!) 102.1 kg (225 lb)   02/04/20 (!) 108.9 kg (240 lb)     Temp Readings from Last 3 Encounters:   01/02/21 36.7 ??C (98 ??F) (Oral)   01/02/21 36.6 ??C (97.9 ??F) (Temporal)   02/04/20 36.7 ??C (98.1 ??F) (Oral)     BP Readings from Last 3 Encounters:   01/29/21 140/101   01/02/21 141/93   01/02/21 150/85     Pulse Readings from Last 3 Encounters:   01/29/21 90   01/02/21 89   01/02/21 90     Estimated body mass index is 37.1 kg/m?? as calculated from the following:    Height as of 01/29/21: 172.7 cm (5' 8).    Weight as of 01/29/21: 110.7 kg (244 lb).  No height and weight on file for this encounter.        Objective:      As part of this Video Visit, no in-person exam was conducted.  Video interaction permitted the following observations.    GEN: No acute distress.   SKIN: Color is normal. No rashes.  PSYCH: Appropriate affect, normal mood  RESP: Normal work of breathing, no retractions      I attest that I, Bayard Hugger, personally documented this note while acting as scribe for Noralyn Pick, FNP.      Bayard Hugger, Scribe.  03/24/2021     The documentation recorded by the scribe accurately reflects the service I personally performed and the decisions made by me.    Noralyn Pick, FNP

## 2021-03-24 NOTE — Unmapped (Signed)
[facility] has made a second and final attempt to reach this patient to refill the following medication:Humira.      We have left voicemails on the following phone numbers: [phone] and have sent a MyChart message.    Dates contacted: 5/13 and 5/18  Last scheduled delivery: 4/21    The patient may be at risk of non-compliance with this medication. The patient should call the [facility] at [phone] (option 4) to refill medication.    Willamina Grieshop D Administrator [facility] Specialty Technician

## 2021-03-25 NOTE — Unmapped (Unsigned)
Assessment and Plan:     There are no diagnoses linked to this encounter.     BP {at goal/not at WNUU:72536} (***/*** in clinic today). Continue amlodipine 10 mg daily and chlorthalidone 25 mg daily***. Reviewed low sodium diet and encouraged regular exercise. Advised to continue to monitor and log at-home BP readings.    Continue low cholesterol diet. Encouraged regular exercise.    I personally spent *** minutes face-to-face and non-face-to-face in the care of this patient, which includes all pre, intra, and post visit time on the date of service.    No follow-ups on file.    HPI:      Heather Watts  is here for No chief complaint on file.    Hypertension: Patient presents for follow-up of hypertension. Blood pressure goal < 140/90.  Hypertension has customarily {been/notbeen:38678} at goal complicated by ***.  Home blood pressure readings: {home bp readings:17448}. Salt intake and diet: {salt intake/diet:17449}. Associated signs and symptoms: {symptoms hypertension:17452}. Patient denies: {symptoms hypertension:19611}. Medication compliance: {med compliance:10573}. She {is/is not:23060} doing regular exercise.      Hyperlipidemia: Patient presents for follow-up of dyslipidemia. Compliance with treatment has been {good/fair/poor:33178}. The patient exercises {never/rarely/daily:14900}. Patient {denies/complains:31533} muscle pain associated with Her medications. She {is/is not:23060} adhering to a low fat/low cholesterol diet.     {kerichronicdx:74702}    {keriacutedx:74703}     ROS:      Comprehensive 10 point ROS negative unless otherwise stated in the HPI.      [facility] Components:     Medication adherence and barriers to the treatment plan have been addressed. Opportunities to optimize healthy behaviors have been discussed. Patient / caregiver voiced understanding.    Past Medical/Surgical History:     Past Medical History:   Diagnosis Date   ??? GERD (gastroesophageal reflux disease)    ??? Hyperlipidemia    ??? tablet 3   ??? amoxicillin (AMOXIL) 875 MG tablet Take 1 tablet (875 mg total) by mouth every twelve (12) hours. 14 tablet 0   ??? chlorthalidone (HYGROTON) 25 MG tablet Take 1 tablet (25 mg total) by mouth daily. 30 tablet 6   ??? cyclobenzaprine (FLEXERIL) 5 MG tablet Take 1-2 tabs po tid prn 30 tablet 0   ??? empty container Misc Use as directed to dispose of Humira pens. 1 each 2   ??? fluticasone propionate (FLONASE) 50 mcg/actuation nasal spray 1 spray by Each Nare route once as needed for rhinitis. 16 g 2   ??? fluticasone propionate (FLONASE) 50 mcg/actuation nasal spray 1 spray into each nostril daily. 16 g 0   ??? HUMIRA PEN CITRATE FREE 40 MG/0.4 ML Inject the contents of 1 pen (40 mg total) under the skin every seven (7) days. 4 each 11   ??? HUMIRA PEN CITRATE FREE STARTER PACK FOR CROHN'S/UC/HS 3 X 80 MG/0.8 ML Inject the contents of 2 pens (160mg ) under the skin on day 1, then inject the contents of 1 pen (80mg ) on day 15. 3 each 0   ??? omeprazole (PRILOSEC) 40 MG capsule Take 40 mg by mouth daily.      ??? potassium chloride (KLOR-CON) 20 MEQ CR tablet Take 2 tablets (40 mEq total) by mouth daily. 120 tablet 2   ??? spironolactone (ALDACTONE) 100 MG tablet Take 1 tablet (100 mg total) by mouth daily. 30 tablet 11     No current facility-administered medications for this visit.       Health Maintenance:     Health Maintenance  Topic Date Due   ??? Pap Smear (21-65)  09/07/2003   ??? COVID-19 Vaccine (2 - Pfizer 3-dose series) 07/11/2020   ??? Serum Creatinine Monitoring  03/03/2022   ??? Potassium Monitoring  03/03/2022   ??? Lipid Screening  08/15/2024   ??? DTaP/Tdap/Td Vaccines (4 - Td or Tdap) 07/04/2028   ??? Hepatitis C Screen  Completed   ??? Influenza Vaccine  Completed   ??? Pneumococcal Vaccine 0-64  Aged Out       Immunizations:     Immunization History   Administered Date(s) Administered   ??? COVID-19 VACC,MRNA,(PFIZER)(PF)(IM) 06/20/2020   ??? Hepatitis B, Adult 07/04/2018, 08/01/2018, 12/06/2018   ??? INFLUENZA TIV (TRI) PF (IM) 12/09/2009   ??? Influenza Vaccine Quad (IIV4 PF) 47mo+ injectable 08/15/2018, 08/13/2019   ??? Influenza Virus Vaccine, unspecified formulation 08/15/2018, 08/13/2019, 08/22/2020   ??? MMR 05/26/2008, 07/11/2008   ??? TdaP 05/14/2008   ??? Tetanus and diptheria, adsorbed,(adult), 5Lf tetanus toxoid,PF 07/11/2008, 07/04/2018     I have reviewed and (if needed) updated the patient's problem list, medications, allergies, past medical and surgical history, social and family history.     Vital Signs:     Wt Readings from Last 3 Encounters:   01/29/21 (!) 110.7 kg (244 lb)   01/02/21 (!) 102.1 kg (225 lb)   02/04/20 (!) 108.9 kg (240 lb)     Temp Readings from Last 3 Encounters:   01/02/21 36.7 ??C (98 ??F) (Oral)   01/02/21 36.6 ??C (97.9 ??F) (Temporal)   02/04/20 36.7 ??C (98.1 ??F) (Oral)     BP Readings from Last 3 Encounters:   01/29/21 140/101   01/02/21 141/93   01/02/21 150/85     Pulse Readings from Last 3 Encounters:   01/29/21 90   01/02/21 89   01/02/21 90     Estimated body mass index is 37.1 kg/m?? as calculated from the following:    Height as of 01/29/21: 172.7 cm (5' 8).    Weight as of 01/29/21: 110.7 kg (244 lb).  No height and weight on file for this encounter.      Objective:      General: Alert and oriented x3. Well-appearing. No acute distress. ***  HEENT:  Normocephalic.  Atraumatic. Conjunctiva and sclera normal. OP MMM without lesions. ***  Neck:  Supple. No thyroid enlargement. No adenopathy. ***  Heart:  Regular rate and rhythm. Normal S1, S2. No murmurs, rubs or gallops. ***  Lungs:  No respiratory distress.  Lungs clear to auscultation. No wheezes, rhonchi, or rales. ***  GI/GU:  Soft, +BS, nondistended, non-TTP. No palpable masses or organomegaly. ***  Extremities:  No edema. Peripheral pulses normal. ***  Skin:  Warm, dry. No rash or lesions present. ***  Neuro:  Non-focal. No obvious weakness. ***  Psych:  Affect normal, eye contact good, speech clear and coherent. ***     I attest that I, Bayard Hugger, personally documented this note while acting as scribe for Noralyn Pick, FNP.      Bayard Hugger, Scribe.  03/29/2021     The documentation recorded by the scribe accurately reflects the service I personally performed and the decisions made by me.    Noralyn Pick, FNP

## 2021-03-25 NOTE — Unmapped (Signed)
[facility] Refill Coordination Note    Specialty Medication(s) to be Shipped:   Inflammatory Disorders: Humira    Other medication(s) to be shipped: No additional medications requested for fill at this time     Heather Watts, DOB: August 26, 1971  Phone: [phone] (home)       All above HIPAA information was verified with patient.     Was a Nurse, learning disability used for this call? No    Completed refill call assessment today to schedule patient's medication shipment from the [facility] [phone]).  All relevant notes have been reviewed.     Specialty medication(s) and dose(s) confirmed: Regimen is correct and unchanged.   Changes to medications: Beverlie reports no changes at this time.  Changes to insurance: No  New side effects reported not previously addressed with a pharmacist or physician: None reported  Questions for the pharmacist: No    Confirmed patient received a Conservation officer, historic buildings and a Surveyor, mining with first shipment. The patient will receive a drug information handout for each medication shipped and additional FDA Medication Guides as required.       DISEASE/MEDICATION-SPECIFIC INFORMATION        For patients on injectable medications: Patient currently has 0 doses left.  Next injection is scheduled for 04/06/21.    SPECIALTY MEDICATION ADHERENCE     Medication Adherence    Patient reported X missed doses in the last month: 1  Specialty Medication: Humira Cf 40 mg/0.4 ml  Patient is on additional specialty medications: No  Informant: patient  Reasons for non-adherence: patient forgets              Were doses missed due to medication being on hold? No      REFERRAL TO PHARMACIST     Referral to the pharmacist: Not needed      [facility]     Shipping address confirmed in Epic.     Delivery Scheduled: Yes, Expected medication delivery date: 04/06/21.     Medication will be delivered via Same Day Courier to the prescription address in Epic WAM.    [place] A Wade Asebedo   [facility] Specialty Technician

## 2021-04-06 MED FILL — HUMIRA PEN CITRATE FREE 40 MG/0.4 ML: SUBCUTANEOUS | 28 days supply | Qty: 4 | Fill #10

## 2021-04-15 NOTE — Unmapped (Signed)
Pt's chart reviewed for [employer] quity program.     HTN meds changed by cardiology in 01/2021. Plan to recheck BP at next PCP appt.     SDOH questionnaire sent to pt via mychart.     Cammi Consalvo Toya Smothers, RN

## 2021-04-29 NOTE — Unmapped (Signed)
Assessment and Plan:     Heather Watts was seen today for annual exam, hypertension, hyperlipidemia and back pain.    Diagnoses and all orders for this visit:    Annual physical exam  Discussed healthy behaviors including well balanced diet and regular exercise.  -     Basic Metabolic Panel    Essential hypertension  BP at goal (120/80 in clinic today).   Managed by cardiology. Continue amlodipine 10 mg daily and chlorthalidone 25 mg daily. Reviewed low sodium diet and encouraged regular exercise. Advised to continue to monitor and log at-home BP readings.  -     Basic Metabolic Panel    Mixed hyperlipidemia  Continue low cholesterol diet.   Recheck lipids today. Will contact patient with results.  -     Lipid Panel    Chronic right-sided thoracic back pain  Recommended cyclobenzaprine at HS, heat, use of lumbar support pillow at work.    Hidradenitis suppurativa  Defer ongoing management to dermatology. Appointment scheduled for 06/29/21.     Screening mammogram for breast cancer  -     Mammography screening bilateral; Future      Barriers to goals identified and addressed. Pertinent handouts were given today and reviewed with the patient as indicated.  The Care Plan and Self-Management goals have been included on the AVS and the AVS has been printed.  I encouraged the patient to keep regular logs for me to review at their next visit. Any outside resources or referrals needed at this time are noted above. Patient's current medications have been reviewed. Any new medications prescribed have been discussed, and side effects have been addressed.  Have assessed the patient's understanding, response, and barriers to adherence to medications. Patient voiced understanding and all questions have been answered to satisfaction.     Return in about 6 months (around 10/30/2021) for Next scheduled follow up.    Subjective:      Heather Watts 50 y.o.female is being seen for a health maintenance exam. Last pap smear was years ago with a normal pap and negative HPV. Patient unable to recall exact date of last pap smear.   Patient's last menstrual period was 04/28/2021 (exact date).    Chief Complaint   Patient presents with   ??? Annual Exam     Nonfasting.  Declines pap exam since she is having her period.  She can return for that.   ??? Hypertension     Wants potassium checked.  Pericardial effusion in 12/2020.  Cardiologist changed BP meds and potassium dropped.  Wants to stop potassium.  Did stop Spironolactone due to side effects.   ??? Hyperlipidemia   ??? Back Pain     RT mid back pain o/o x 6-10 months and back the last 3 days.  No known injury, denies numbness, and tingling.  Improves after she gets up and moves.       Lifestyle:  Employment: [employer]  Exercise: none devoted  Diet: regular  Caffeine use: soda  Last Dental: 2-3 years ago  Last Vision: none. Patient wears reading glasses.     HPI:    Patient reports recent pericardial effusion, seen on POCUS at ED in 12/2020. She had outpatient follow up with cardiology 01/29/21, in which specialist switched hydrochlorothiazide to chlorthalidone, stopped metoprolol and initiated amlodipine. Labs in 02/2021 revealed low potassium, and specialist advised to start potassium supplements. Patient expresses she does not want to take potassium supplements. She is requesting labs today to recheck  potassium levels.    Back Pain: Patient reports right mid back pain. Symptoms have been present for 6-10 months, occurring intermittently. Onset was related to / precipitated by no known injury. Aggravating factors: certain movements including twisting and getting up from chair, lying on right side. Symptoms seem to improve throughout the day. She denies any numbness or tingling. Therapies tried: Tylenol, cyclobenzaprine (rx'd by another provider in this clinic 08/2020). She states cyclobenzaprine was effective, but reports side effects of drowsiness.   She states chair at work does not have good back support.     PMH includes HS. Patient reports current flare to bilateral breasts. She states she has follow up with dermatology scheduled for next month.     HM:  Patient defers pap smear today due to menses.        ROS:     Gen:  No fever, excessive fatigue, weight loss or gain  EYES: No eye pain, eye discharge; (+) decreased visual acuity  ENT:  No chronic sore throat, ear pain, hearing loss, hoarseness, nosebleeds.  CV: No chest pain, palpitations, claudication, edema.  RESP: No SOB, chronic cough, wheezing, DOE, orthopnea, PND.  GI: No abdominal pain, swallowing problems, heartburn, nausea, constipation, diarrhea, rectal bleeding.  GU: No dysuria, incontinence, abnormal vaginal bleeding, unusual vaginal d/c, pelvic pain, breast pain or masses.  MS: No myalgias, joint swelling; (+) right-sided thoracic back pain  SKIN:  No abnormal bruising or bleeding; (+) multiple boils to bilateral breasts  PSYCH: No sleep problems, anxiety, depression, abnormal thoughts  NEURO: No chronic headaches, numbness, confusion, difficulty walking, dizziness  Reproductive health:  Period Regularity: irregular  Contraception: none, pt declines need for birth control    Social History:     Social History     Tobacco Use   ??? Smoking status: Former Smoker     Packs/day: 1.00     Years: 14.00     Pack years: 14.00     Types: Cigarettes     Quit date: 11/08/2003     Years since quitting: 17.4   ??? Smokeless tobacco: Never Used   ??? Tobacco comment: Quit smoking 2005   Vaping Use   ??? Vaping Use: Never used   Substance Use Topics   ??? Alcohol use: Not Currently   ??? Drug use: Never       Past Medical/Surgical History:     Past Medical History:   Diagnosis Date   ??? COVID-19 11/06/2020   ??? GERD (gastroesophageal reflux disease)    ??? Hyperlipidemia    ??? Hypertension      No past surgical history on file.     Family History:     Family History   Problem Relation Age of Onset   ??? Cancer Mother    ??? Hypertension Mother         Everyone on mother's side of family   ??? COPD Father    ??? Hyperlipidemia Father    ??? Hypertension Father    ??? Cancer Maternal Aunt    ??? Cancer Maternal Uncle    ??? Early death Maternal Uncle         Heart Attack   ??? Aneurysm Maternal Uncle    ??? Stroke Maternal Grandmother    ??? Melanoma Neg Hx    ??? Basal cell carcinoma Neg Hx    ??? Squamous cell carcinoma Neg Hx      Allergies:     Patient has no known allergies.  Current Medications:     Current Outpatient Medications   Medication Sig Dispense Refill   ??? amLODIPine (NORVASC) 10 MG tablet Take 1 tablet (10 mg total) by mouth daily. 90 tablet 3   ??? chlorthalidone (HYGROTON) 25 MG tablet Take 1 tablet (25 mg total) by mouth daily. 30 tablet 6   ??? empty container Misc Use as directed to dispose of Humira pens. 1 each 2   ??? fluticasone propionate (FLONASE) 50 mcg/actuation nasal spray 1 spray into each nostril daily. 16 g 0   ??? HUMIRA PEN CITRATE FREE 40 MG/0.4 ML Inject the contents of 1 pen (40 mg total) under the skin every seven (7) days. 4 each 11   ??? omeprazole (PRILOSEC) 40 MG capsule Take 40 mg by mouth daily.      ??? potassium chloride (KLOR-CON) 20 MEQ CR tablet Take 2 tablets (40 mEq total) by mouth daily. (Patient taking differently: Take 20 mEq by mouth in the morning.) 120 tablet 2   ??? cyclobenzaprine (FLEXERIL) 5 MG tablet Take 1-2 tabs po tid prn (Patient not taking: Reported on 04/30/2021) 30 tablet 0   ??? fluticasone propionate (FLONASE) 50 mcg/actuation nasal spray 1 spray by Each Nare route once as needed for rhinitis. 16 g 2     No current facility-administered medications for this visit.       Health Maintenance:     Health Maintenance   Topic Date Due   ??? Pap Smear (21-65)  09/07/2003   ??? COVID-19 Vaccine (3 - Pfizer risk series) 11/30/2020   ??? Serum Creatinine Monitoring  03/03/2022   ??? Potassium Monitoring  03/03/2022   ??? Lipid Screening  08/15/2024   ??? DTaP/Tdap/Td Vaccines (4 - Td or Tdap) 07/04/2028   ??? Hepatitis C Screen  Completed   ??? Influenza Vaccine  Completed   ??? Pneumococcal Vaccine 0-64  Aged Out       Immunizations:     Immunization History   Administered Date(s) Administered   ??? COVID-19 VACC,MRNA,(PFIZER)(PF)(IM) 06/20/2020, 11/02/2020   ??? Hepatitis B, Adult 07/04/2018, 08/01/2018, 12/06/2018   ??? INFLUENZA TIV (TRI) PF (IM) 12/09/2009   ??? Influenza Vaccine Quad (IIV4 PF) 81mo+ injectable 08/15/2018, 08/13/2019   ??? Influenza Virus Vaccine, unspecified formulation 08/15/2018, 08/13/2019, 08/22/2020   ??? MMR 05/26/2008, 07/11/2008   ??? TdaP 05/14/2008   ??? Tetanus and diptheria, adsorbed,(adult), 5Lf tetanus toxoid,PF 07/11/2008, 07/04/2018     I have reviewed and (if needed) updated the patient's problem list, medications, allergies, past medical and surgical history, preventive services, and social and family history.    Vital Signs:     Wt Readings from Last 3 Encounters:   04/30/21 (!) 108.9 kg (240 lb)   01/29/21 (!) 110.7 kg (244 lb)   01/02/21 (!) 102.1 kg (225 lb)     Temp Readings from Last 3 Encounters:   04/30/21 36.7 ??C (98 ??F) (Oral)   01/02/21 36.7 ??C (98 ??F) (Oral)   01/02/21 36.6 ??C (97.9 ??F) (Temporal)     BP Readings from Last 3 Encounters:   04/30/21 120/80   01/29/21 140/101   01/02/21 141/93     Pulse Readings from Last 3 Encounters:   04/30/21 94   01/29/21 90   01/02/21 89      Estimated body mass index is 35.75 kg/m?? as calculated from the following:    Height as of this encounter: 174.5 cm (5' 8.7).    Weight as of this encounter: 108.9 kg (240 lb).  Facility age limit for growth percentiles is 20 years.      Objective:      General Appearance: Alert, cooperative, no distress, appears stated age.  Head and Neck: Normocephalic, atraumatic. No  masses. No thyromegaly. No bruits.  EENT: Eyes: PERRLA, Conjuntiva clear; Ears: bilateral TMs normal; Nose: septum and turbinates unremarkable; Throat: mucus membranes moist. No tonsilar enlargement or exudates. No OP erythema.  Cardiovascular:  Regular rate and rhythm. Normal S1, S2. No murmurs, rubs or gallops.  Respiratory: Lungs clear to auscultation bilaterally,  Respiratory effort unremarkable. No wheezes or rhonchi.  Breast: Symmetrical. No nipple discharge. 3-4 inframammary left breast nodules (HS).  Gastrointestinal: Abdomen soft and non-tender. + BS, non-distended. No organomegaly or masses.  Genitourinary: deferred  MSK: Gait and station unremarkable. Normal ROM major joints. Normal strength and tone of proximal muscles.  Extremities: No edema. Peripheral pulses normal  Integumentary: Warm and dry. No rashes. No abnormal appearing skin lesions. Left axilla with 2 draining sinuses, 2-3 clustered inflammatory nodules  LYMPH NODES: Cervical, supraclavicular, and axillary nodes normal  Neurologic: Alert and oriented to place and time. Normal deep tendon reflexes. CN II-XII grossly intact.  Psychiatric: Mood and affect appropriate for situation.     Labs:     No results found for this visit on 04/30/21.    I attest that I, Bayard Hugger, personally documented this note while acting as scribe for Noralyn Pick, FNP.      Bayard Hugger, Scribe.  04/30/2021     The documentation recorded by the scribe accurately reflects the service I personally performed and the decisions made by me.    Noralyn Pick, FNP

## 2021-04-30 ENCOUNTER — Encounter: Admit: 2021-04-30 | Discharge: 2021-05-01 | Payer: PRIVATE HEALTH INSURANCE | Attending: Family | Primary: Family

## 2021-04-30 DIAGNOSIS — G8929 Other chronic pain: Principal | ICD-10-CM

## 2021-04-30 DIAGNOSIS — I10 Essential (primary) hypertension: Principal | ICD-10-CM

## 2021-04-30 DIAGNOSIS — L732 Hidradenitis suppurativa: Principal | ICD-10-CM

## 2021-04-30 DIAGNOSIS — E782 Mixed hyperlipidemia: Principal | ICD-10-CM

## 2021-04-30 DIAGNOSIS — M546 Pain in thoracic spine: Principal | ICD-10-CM

## 2021-04-30 DIAGNOSIS — Z1231 Encounter for screening mammogram for malignant neoplasm of breast: Principal | ICD-10-CM

## 2021-04-30 DIAGNOSIS — Z0000 Encounter for general adult medical examination without abnormal findings: Principal | ICD-10-CM

## 2021-04-30 LAB — BASIC METABOLIC PANEL
ANION GAP: 8 mmol/L (ref 5–14)
BLOOD UREA NITROGEN: 15 mg/dL (ref 9–23)
BUN / CREAT RATIO: 19
CALCIUM: 11 mg/dL — ABNORMAL HIGH (ref 8.7–10.4)
CHLORIDE: 102 mmol/L (ref 98–107)
CO2: 33 mmol/L — ABNORMAL HIGH (ref 20.0–31.0)
CREATININE: 0.77 mg/dL
EGFR CKD-EPI (2021) FEMALE: >90 mL/min/{1.73_m2} (ref >=60)
GLUCOSE RANDOM: 87 mg/dL (ref 70–179)
POTASSIUM: 3.1 mmol/L — ABNORMAL LOW (ref 3.4–4.8)
SODIUM: 143 mmol/L (ref 135–145)

## 2021-04-30 LAB — LIPID PANEL
CHOLESTEROL/HDL RATIO SCREEN: 5.6 — ABNORMAL HIGH (ref 1.0–4.5)
CHOLESTEROL: 258 mg/dL — ABNORMAL HIGH (ref <=200)
HDL CHOLESTEROL: 46 mg/dL (ref 40–60)
LDL CHOLESTEROL CALCULATED: 174 mg/dL — ABNORMAL HIGH (ref 40–99)
NON-HDL CHOLESTEROL: 212 mg/dL — ABNORMAL HIGH (ref 70–130)
TRIGLYCERIDES: 190 mg/dL — ABNORMAL HIGH (ref 0–150)
VLDL CHOLESTEROL CAL: 38 mg/dL — ABNORMAL HIGH (ref 9–37)

## 2021-05-06 NOTE — Unmapped (Signed)
Hello, I saw the results from labs. Should I take the potassium twice a day and should I try to start the cholestoff again? My appointment with Dermatology isn't until August 23rd. Is there anything that you can prescribe me for the boils until then? They are still open/leaking. Thank You!

## 2021-05-07 DIAGNOSIS — L732 Hidradenitis suppurativa: Principal | ICD-10-CM

## 2021-05-07 MED ORDER — HUMIRA PEN CITRATE FREE 40 MG/0.4 ML
SUBCUTANEOUS | 1 refills | 28 days | Status: CP
Start: 2021-05-07 — End: ?

## 2021-05-07 MED ORDER — DOXYCYCLINE HYCLATE 100 MG CAPSULE
ORAL_CAPSULE | Freq: Two times a day (BID) | ORAL | 0 refills | 10 days | Status: CP
Start: 2021-05-07 — End: 2021-05-17

## 2021-05-07 NOTE — Unmapped (Signed)
Refill request for HUMIRA PEN CITRATE FREE 40 MG/0.4 ML.    LOV: 04/30/2020    Medication pended for your review.

## 2021-05-07 NOTE — Unmapped (Signed)
Patient needs a follow-up prior to further refills on Humira.

## 2021-05-07 NOTE — Unmapped (Signed)
Patient is scheduled for 08/23. That's the available. I did out patient on waitlist. Thanks!

## 2021-05-11 ENCOUNTER — Encounter: Admit: 2021-05-11 | Discharge: 2021-05-12 | Payer: PRIVATE HEALTH INSURANCE

## 2021-05-11 DIAGNOSIS — L732 Hidradenitis suppurativa: Principal | ICD-10-CM

## 2021-05-11 DIAGNOSIS — Z79899 Other long term (current) drug therapy: Principal | ICD-10-CM

## 2021-05-11 MED ORDER — HUMIRA PEN CITRATE FREE 40 MG/0.4 ML
SUBCUTANEOUS | 11 refills | 28.00000 days | Status: CP
Start: 2021-05-11 — End: ?
  Filled 2021-05-13: qty 4, 28d supply, fill #0

## 2021-05-11 NOTE — Unmapped (Signed)
I will put in a referral to the plastic surgery to take care of underarm areas

## 2021-05-11 NOTE — Unmapped (Signed)
DERMATOLOGY CLINIC NOTE    ASSESSMENT AND PLAN:     Hidradenitis suppurativa, reasonable control:  - HUMIRA PEN CITRATE FREE 40 MG/0.4 ML; Inject the contents of 1 pen (40 mg total) under the skin every seven (7) days.  Dispense: 4 each; Refill: 11  -Discussed condition expectations.  She has previously been reasonably controlled on Humira.  - Ambulatory referral to Plastic Surgery; Future.  This referral is to discussed options in terms of her axilla which are her main problem area.    High risk medication use  - Quantiferon TB Gold Plus; Future    Return to clinic:  6 months or sooner as needed.    CHIEF COMPLAINT:  Follow-up hidradenitis     HPI:   This is a pleasant 50 y.o. female who last saw me on 04/30/2020.  At that time she was doing Humira and spironolactone.  She was doing this for hidradenitis.  She had a recent flare but her primary doctor prescribed her doxycycline which was helpful.  She has been without her Humira for 2 to 3 weeks.  She does feel like the Humira has been helpful.  Today she is doing reasonably well.  Her main problem here is for bilateral axilla.  She denies any issues anywhere else and declines exam anywhere else.     PAST MEDICAL HISTORY:  Hypertension  Hyperlipidemia  Gastroesophageal reflux disease  Hidradenitis     MEDICATIONS:   Current Outpatient Medications on File Prior to Visit   Medication Sig Dispense Refill   ??? amLODIPine (NORVASC) 10 MG tablet Take 1 tablet (10 mg total) by mouth daily. 90 tablet 3   ??? chlorthalidone (HYGROTON) 25 MG tablet Take 1 tablet (25 mg total) by mouth daily. 30 tablet 6   ??? cyclobenzaprine (FLEXERIL) 5 MG tablet Take 1-2 tabs po tid prn 30 tablet 0   ??? doxycycline (VIBRAMYCIN) 100 MG capsule Take 1 capsule (100 mg total) by mouth Two (2) times a day for 10 days. 20 capsule 0   ??? fluticasone propionate (FLONASE) 50 mcg/actuation nasal spray 1 spray into each nostril daily. 16 g 0   ??? HUMIRA PEN CITRATE FREE 40 MG/0.4 ML Inject the contents of 1 pen (40 mg total) under the skin every seven (7) days. 4 each 1   ??? omeprazole (PRILOSEC) 40 MG capsule Take 40 mg by mouth daily.      ??? potassium chloride (KLOR-CON) 20 MEQ CR tablet Take 2 tablets (40 mEq total) by mouth daily. (Patient taking differently: Take 20 mEq by mouth in the morning.) 120 tablet 2   ??? empty container Misc Use as directed to dispose of Humira pens. 1 each 2     No current facility-administered medications on file prior to visit.       ALLERGIES:   No known allergies    SOCIAL HISTORY:  Accompanied by daughter     REVIEW OF SYSTEMS:  Baseline state of health. No recent illnesses. No other skin complaints.     PHYSICAL EXAMINATION:  Examination in the presence of chaperone:  General: Well-developed, well-nourished. No acute distress.   Neuro: Alert and oriented, answers questions appropriately.  Skin: Examination of the bilateral axilla was performed and notable for the following:  Scarring and scar down sinus tracts on bilateral axilla minimal active inflammation     Dictation software was used while making this note. Please excuse any errors made with dictation software.

## 2021-05-13 NOTE — Unmapped (Signed)
Heather Watts is stable on her Humira - she missed ~ 2 doses while we waited for a new order, and for the PA to be renewed with her plan. Otherwise, she has found Humira to be helpful overall. She's currently taking a course of doxycycline for a flare.     [facility] Clinical Assessment & Refill Coordination Note    Heather Watts, DOB: 1971-09-09  Phone: [phone] (home)     All above HIPAA information was verified with patient.     Was a Nurse, learning disability used for this call? No    Specialty Medication(s):   Inflammatory Disorders: Humira     Current Outpatient Medications   Medication Sig Dispense Refill   ??? amLODIPine (NORVASC) 10 MG tablet Take 1 tablet (10 mg total) by mouth daily. 90 tablet 3   ??? chlorthalidone (HYGROTON) 25 MG tablet Take 1 tablet (25 mg total) by mouth daily. 30 tablet 6   ??? cyclobenzaprine (FLEXERIL) 5 MG tablet Take 1-2 tabs po tid prn 30 tablet 0   ??? doxycycline (VIBRAMYCIN) 100 MG capsule Take 1 capsule (100 mg total) by mouth Two (2) times a day for 10 days. 20 capsule 0   ??? empty container Misc Use as directed to dispose of Humira pens. 1 each 2   ??? fluticasone propionate (FLONASE) 50 mcg/actuation nasal spray 1 spray into each nostril daily. 16 g 0   ??? HUMIRA PEN CITRATE FREE 40 MG/0.4 ML Inject the contents of 1 pen (40 mg total) under the skin every seven (7) days. 4 each 11   ??? omeprazole (PRILOSEC) 40 MG capsule Take 40 mg by mouth daily.      ??? potassium chloride (KLOR-CON) 20 MEQ CR tablet Take 2 tablets (40 mEq total) by mouth daily. (Patient taking differently: Take 20 mEq by mouth in the morning.) 120 tablet 2     No current facility-administered medications for this visit.        Changes to medications: Salaya reports no changes at this time.    No Known Allergies    Changes to allergies: No    SPECIALTY MEDICATION ADHERENCE     Humira - 0 left  Medication Adherence    Patient reported X missed doses in the last month: 0  Specialty Medication: Humira          Specialty medication(s) dose(s) confirmed: Regimen is correct and unchanged.     Are there any concerns with adherence? No    Adherence counseling provided? Not needed    CLINICAL MANAGEMENT AND INTERVENTION      Clinical Benefit Assessment:    Do you feel the medicine is effective or helping your condition? Yes    Clinical Benefit counseling provided? Not needed    Adverse Effects Assessment:    Are you experiencing any side effects? No    Are you experiencing difficulty administering your medicine? No    Quality of Life Assessment:    Quality of Life    Rheumatology  On a scale of 1 - 10 with 1 representing not at all and 10 representing completely - how has your rheumatologic condition affected your:  Oncology  Dermatology  1.On a scale of 1-10, how itchy, sore, painful or stinging has your skin condition been within the last week?: 3  2.On a scale of 1-10, how embarrassed or self-conscious have you felt because of your skin within the last week?: 1  3.On a scale of 1-10, how much has  your skin affected your social or leisure activities within the last week?: 1        Have you discussed this with your provider? Not needed    Acute Infection Status:    Acute infections noted within Epic:  No active infections  Patient reported infection: None    Therapy Appropriateness:    Is therapy appropriate? Yes, therapy is appropriate and should be continued    DISEASE/MEDICATION-SPECIFIC INFORMATION      For patients on injectable medications: Patient currently has 0 doses left.  Next injection is scheduled for asap (overdue, will take 7/7).    PATIENT SPECIFIC NEEDS     - Does the patient have any physical, cognitive, or cultural barriers? No    - Is the patient high risk? No    - Does the patient require a Care Management Plan? No     - Does the patient require physician intervention or other additional services (i.e. nutrition, smoking cessation, social work)? No      SHIPPING     Specialty Medication(s) to be Shipped:   Inflammatory Disorders: Humira    Other medication(s) to be shipped: No additional medications requested for fill at this time     Changes to insurance: No    Delivery Scheduled: Yes, Expected medication delivery date: Thurs, 7/7.     Medication will be delivered via Same Day Courier to the confirmed prescription address in [facility].    The patient will receive a drug information handout for each medication shipped and additional FDA Medication Guides as required.  Verified that patient has previously received a Conservation officer, historic buildings and a Surveyor, mining.    The patient or caregiver noted above participated in the development of this care plan and knows that they can request review of or adjustments to the care plan at any time.      All of the patient's questions and concerns have been addressed.    [name]   [facility] Specialty Pharmacist

## 2021-06-02 MED ORDER — EMPTY CONTAINER
2 refills | 0 days
Start: 2021-06-02 — End: ?

## 2021-06-02 NOTE — Unmapped (Signed)
[facility] Refill Coordination Note    Specialty Medication(s) to be Shipped:   Inflammatory Disorders: Humira 40 mg/0.4 ml     Other medication(s) to be shipped: [employer] Heather Watts, DOB: 03-18-1971  Phone: [phone] (home)       All above HIPAA information was verified with patient.     Was a Nurse, learning disability used for this call? No    Completed refill call assessment today to schedule patient's medication shipment from the [facility] [phone]).  All relevant notes have been reviewed.     Specialty medication(s) and dose(s) confirmed: Regimen is correct and unchanged.   Changes to medications: Adriahna reports no changes at this time.  Changes to insurance: No  New side effects reported not previously addressed with a pharmacist or physician: None reported  Questions for the pharmacist: No    Confirmed patient received a Conservation officer, historic buildings and a Surveyor, mining with first shipment. The patient will receive a drug information handout for each medication shipped and additional FDA Medication Guides as required.       DISEASE/MEDICATION-SPECIFIC INFORMATION        For patients on injectable medications: Patient currently has 1 doses left.  Next injection is scheduled for 06/03/2021.    SPECIALTY MEDICATION ADHERENCE     Medication Adherence    Patient reported X missed doses in the last month: 0  Specialty Medication: Humira Cf 40 mg/0.4 ml  Patient is on additional specialty medications: No  Any gaps in refill history greater than 2 weeks in the last 3 months: no  Demonstrates understanding of importance of adherence: yes  Informant: patient  Reliability of informant: reliable  Confirmed plan for next specialty medication refill: delivery by pharmacy  Refills needed for supportive medications: not needed              Were doses missed due to medication being on hold? No      REFERRAL TO PHARMACIST     Referral to the pharmacist: Not needed      [facility] Shipping address confirmed in Epic.     Delivery Scheduled: Yes, Expected medication delivery date: 06/03/2021.     Medication will be delivered via Same Day Courier to the prescription address in Epic WAM.    Astha Probasco D Imogen Maddalena   [facility] Specialty Technician

## 2021-06-03 MED FILL — HUMIRA PEN CITRATE FREE 40 MG/0.4 ML: SUBCUTANEOUS | 28 days supply | Qty: 4 | Fill #1

## 2021-06-03 MED FILL — EMPTY CONTAINER: 120 days supply | Qty: 1 | Fill #0

## 2021-06-24 DIAGNOSIS — L732 Hidradenitis suppurativa: Principal | ICD-10-CM

## 2021-06-24 MED ORDER — DOXYCYCLINE HYCLATE 100 MG CAPSULE
ORAL_CAPSULE | 0 refills | 0.00000 days | Status: CP
Start: 2021-06-24 — End: ?

## 2021-06-24 NOTE — Unmapped (Signed)
[facility] Refill Coordination Note    Specialty Medication(s) to be Shipped:   Inflammatory Disorders: Humira 40 mg/0.4 ml     Other medication(s) to be shipped: No additional medications requested for fill at this time     Heather Watts, DOB: 11/11/1970  Phone: [phone] (home)       All above HIPAA information was verified with patient.     Was a Nurse, learning disability used for this call? No    Completed refill call assessment today to schedule patient's medication shipment from the [facility] [phone]).  All relevant notes have been reviewed.     Specialty medication(s) and dose(s) confirmed: Regimen is correct and unchanged.   Changes to medications: Heather Watts reports no changes at this time.  Changes to insurance: No  New side effects reported not previously addressed with a pharmacist or physician: None reported  Questions for the pharmacist: No    Confirmed patient received a Conservation officer, historic buildings and a Surveyor, mining with first shipment. The patient will receive a drug information handout for each medication shipped and additional FDA Medication Guides as required.       DISEASE/MEDICATION-SPECIFIC INFORMATION        For patients on injectable medications: Patient currently has 1 doses left.  Next injection is scheduled for 07/01/2021.    SPECIALTY MEDICATION ADHERENCE     Medication Adherence    Patient reported X missed doses in the last month: 0  Specialty Medication: Humira Cf 40 mg/0.4 ml  Patient is on additional specialty medications: No  Any gaps in refill history greater than 2 weeks in the last 3 months: no  Demonstrates understanding of importance of adherence: yes  Informant: patient  Reliability of informant: reliable  Confirmed plan for next specialty medication refill: delivery by pharmacy  Refills needed for supportive medications: not needed              Were doses missed due to medication being on hold? No      REFERRAL TO PHARMACIST     Referral to the pharmacist: Not needed      [facility]     Shipping address confirmed in Epic.     Delivery Scheduled: Yes, Expected medication delivery date: 07/01/2021.     Medication will be delivered via Same Day Courier to the prescription address in Epic WAM.    Heather Watts D Amandajo Gonder   [facility] Specialty Technician

## 2021-07-01 MED FILL — HUMIRA PEN CITRATE FREE 40 MG/0.4 ML: SUBCUTANEOUS | 28 days supply | Qty: 4 | Fill #2

## 2021-07-15 NOTE — Unmapped (Signed)
Cardiologist took her off of Metoprolol and put her on Chlorthalidone and Amlodipine.  Covid in January but no fatigue until these 2 new meds.  Denies SOB and edema.  No longer sees cardiologist.  Wants to go back to Metoprolol.

## 2021-07-22 NOTE — Unmapped (Signed)
Error

## 2021-07-23 NOTE — Unmapped (Signed)
[facility] Refill Coordination Note    Specialty Medication(s) to be Shipped:   Inflammatory Disorders: Humira 40 mg/0.4 ml     Other medication(s) to be shipped: No additional medications requested for fill at this time     Heather Watts, DOB: May 25, 1971  Phone: [phone] (home)       All above HIPAA information was verified with patient.     Was a Nurse, learning disability used for this call? No    Completed refill call assessment today to schedule patient's medication shipment from the [facility] [phone]).  All relevant notes have been reviewed.     Specialty medication(s) and dose(s) confirmed: Regimen is correct and unchanged.   Changes to medications: Heather Watts reports no changes at this time.  Changes to insurance: No  New side effects reported not previously addressed with a pharmacist or physician: None reported  Questions for the pharmacist: No    Confirmed patient received a Conservation officer, historic buildings and a Surveyor, mining with first shipment. The patient will receive a drug information handout for each medication shipped and additional FDA Medication Guides as required.       DISEASE/MEDICATION-SPECIFIC INFORMATION        For patients on injectable medications: Patient currently has 1 doses left.  Next injection is scheduled for 07/29/2021.    SPECIALTY MEDICATION ADHERENCE     Medication Adherence    Patient reported X missed doses in the last month: 0  Specialty Medication: Humira Cf 40 mg/0.4 ml  Patient is on additional specialty medications: No  Any gaps in refill history greater than 2 weeks in the last 3 months: no  Demonstrates understanding of importance of adherence: yes  Informant: patient  Reliability of informant: reliable  Confirmed plan for next specialty medication refill: delivery by pharmacy  Refills needed for supportive medications: not needed              Were doses missed due to medication being on hold? No      REFERRAL TO PHARMACIST     Referral to the pharmacist: Not needed      [facility]     Shipping address confirmed in Epic.     Delivery Scheduled: Yes, Expected medication delivery date: 07/29/2021.     Medication will be delivered via Same Day Courier to the prescription address in Epic WAM.    Heather Watts   [facility] Specialty Technician

## 2021-07-26 DIAGNOSIS — E782 Mixed hyperlipidemia: Principal | ICD-10-CM

## 2021-07-26 DIAGNOSIS — E876 Hypokalemia: Principal | ICD-10-CM

## 2021-07-26 MED ORDER — METOPROLOL SUCCINATE ER 25 MG TABLET,EXTENDED RELEASE 24 HR
ORAL_TABLET | Freq: Every day | ORAL | 1 refills | 90 days | Status: CP
Start: 2021-07-26 — End: 2022-07-26

## 2021-07-26 NOTE — Unmapped (Signed)
She use to be on hydrochlorothiazide with Metoprolol.  Wondering is she needs to switch back to that instead of Chlorthalidone.  She states her potassium was never low while on HCTZ.

## 2021-07-29 MED FILL — HUMIRA PEN CITRATE FREE 40 MG/0.4 ML: SUBCUTANEOUS | 28 days supply | Qty: 4 | Fill #3

## 2021-08-19 MED ORDER — HYDROCHLOROTHIAZIDE 25 MG TABLET
ORAL_TABLET | Freq: Every day | ORAL | 1 refills | 90 days
Start: 2021-08-19 — End: 2022-08-19

## 2021-08-20 MED ORDER — HYDROCHLOROTHIAZIDE 25 MG TABLET
ORAL_TABLET | Freq: Every day | ORAL | 1 refills | 90 days | Status: CP
Start: 2021-08-20 — End: 2022-08-20

## 2021-08-20 NOTE — Unmapped (Signed)
Addended by: Noralyn Pick on: 08/20/2021 09:34 AM     Modules accepted: Orders

## 2021-08-20 NOTE — Unmapped (Signed)
Addended by: Barbaraann Boys on: 08/19/2021 09:39 PM     Modules accepted: Orders

## 2021-08-20 NOTE — Unmapped (Signed)
[facility] Refill Coordination Note    Specialty Medication(s) to be Shipped:   Inflammatory Disorders: Humira 40 mg/0.4 ml     Other medication(s) to be shipped: No additional medications requested for fill at this time     Heather Watts, DOB: 1971-04-14  Phone: [phone] (home)       All above HIPAA information was verified with patient.     Was a Nurse, learning disability used for this call? No    Completed refill call assessment today to schedule patient's medication shipment from the [facility] [phone]).  All relevant notes have been reviewed.     Specialty medication(s) and dose(s) confirmed: Regimen is correct and unchanged.   Changes to medications: Heather Watts reports no changes at this time.  Changes to insurance: No  New side effects reported not previously addressed with a pharmacist or physician: None reported  Questions for the pharmacist: No    Confirmed patient received a Conservation officer, historic buildings and a Surveyor, mining with first shipment. The patient will receive a drug information handout for each medication shipped and additional FDA Medication Guides as required.       DISEASE/MEDICATION-SPECIFIC INFORMATION        For patients on injectable medications: Patient currently has 1 doses left.  Next injection is scheduled for 08/26/2021.    SPECIALTY MEDICATION ADHERENCE     Medication Adherence    Patient reported X missed doses in the last month: 0  Specialty Medication: Humira Cf 40 mg/0.4 ml  Patient is on additional specialty medications: No  Any gaps in refill history greater than 2 weeks in the last 3 months: no  Demonstrates understanding of importance of adherence: yes  Informant: patient  Reliability of informant: reliable  Confirmed plan for next specialty medication refill: delivery by pharmacy  Refills needed for supportive medications: not needed              Were doses missed due to medication being on hold? No      REFERRAL TO PHARMACIST     Referral to the pharmacist: Not needed      [facility]     Shipping address confirmed in Epic.     Delivery Scheduled: Yes, Expected medication delivery date: 08/26/2021.     Medication will be delivered via Same Day Courier to the prescription address in Epic WAM.    Bee Hammerschmidt D Timiyah Romito   [facility] Specialty Technician

## 2021-08-20 NOTE — Unmapped (Signed)
Hydrochlorothiazide PENDED

## 2021-08-26 MED FILL — HUMIRA PEN CITRATE FREE 40 MG/0.4 ML: SUBCUTANEOUS | 28 days supply | Qty: 4 | Fill #4

## 2021-09-01 DIAGNOSIS — L732 Hidradenitis suppurativa: Principal | ICD-10-CM

## 2021-09-01 MED ORDER — DOXYCYCLINE HYCLATE 100 MG CAPSULE
ORAL_CAPSULE | 0 refills | 0.00000 days | Status: CP
Start: 2021-09-01 — End: ?

## 2021-09-01 MED ORDER — CHLORTHALIDONE 25 MG TABLET
ORAL_TABLET | Freq: Every day | ORAL | 0 refills | 90.00000 days | Status: CP
Start: 2021-09-01 — End: 2022-10-01

## 2021-09-01 NOTE — Unmapped (Signed)
Patient is requesting the following refill  Requested Prescriptions     Pending Prescriptions Disp Refills   ??? chlorthalidone (HYGROTON) 25 MG tablet 30 tablet 6     Sig: Take 1 tablet (25 mg total) by mouth daily.       Recent Visits  Date Type Provider Dept   04/30/21 Office Visit Keri Rosita Fire, FNP [facility]   Showing recent visits within past 365 days with a meds authorizing provider and meeting all other requirements  Future Appointments  Date Type Provider Dept   05/02/22 Appointment Keri Rosita Fire, FNP [facility]   Showing future appointments within next 365 days with a meds authorizing provider and meeting all other requirements       Labs:   Creatinine:   Creatinine (mg/dL)   Date Value   16/08/9603 0.77   03/03/2021 0.89    Potassium:   Potassium (mmol/L)   Date Value   04/30/2021 3.1 (L)   03/03/2021 3.5    Sodium:   Sodium (mmol/L)   Date Value   04/30/2021 143   03/03/2021 142

## 2021-09-22 NOTE — Unmapped (Signed)
The [facility] has made a second and final attempt to reach this patient to refill the following medication:Humira.      We have left voicemails on the following phone numbers:  [phone] and have sent a MyChart message.    Dates contacted: 11/10 and 11/15  Last scheduled delivery: 10/20    The patient may be at risk of non-compliance with this medication. The patient should call the [facility] at [phone]  Option 4, then Option 2 (all other specialty patients) to refill medication.    Unk Lightning   [facility] Specialty Technician

## 2021-09-24 ENCOUNTER — Other Ambulatory Visit: Admit: 2021-09-24 | Discharge: 2021-09-25 | Payer: PRIVATE HEALTH INSURANCE

## 2021-09-24 DIAGNOSIS — E876 Hypokalemia: Principal | ICD-10-CM

## 2021-09-24 DIAGNOSIS — E782 Mixed hyperlipidemia: Principal | ICD-10-CM

## 2021-09-24 LAB — COMPREHENSIVE METABOLIC PANEL
ALBUMIN: 4 g/dL (ref 3.4–5.0)
ALKALINE PHOSPHATASE: 62 U/L (ref 46–116)
ALT (SGPT): 24 U/L (ref 10–49)
ANION GAP: 9 mmol/L (ref 5–14)
AST (SGOT): 18 U/L (ref <=34)
BILIRUBIN TOTAL: 0.6 mg/dL (ref 0.3–1.2)
BLOOD UREA NITROGEN: 15 mg/dL (ref 9–23)
BUN / CREAT RATIO: 18
CALCIUM: 10.5 mg/dL — ABNORMAL HIGH (ref 8.7–10.4)
CHLORIDE: 101 mmol/L (ref 98–107)
CO2: 29 mmol/L (ref 20.0–31.0)
CREATININE: 0.85 mg/dL — ABNORMAL HIGH
EGFR CKD-EPI (2021) FEMALE: 85 mL/min/{1.73_m2} (ref >=60)
GLUCOSE RANDOM: 88 mg/dL (ref 70–99)
POTASSIUM: 3.6 mmol/L (ref 3.4–4.8)
PROTEIN TOTAL: 8.1 g/dL (ref 5.7–8.2)
SODIUM: 139 mmol/L (ref 135–145)

## 2021-09-24 LAB — LIPID PANEL
CHOLESTEROL/HDL RATIO SCREEN: 4.5 (ref 1.0–4.5)
CHOLESTEROL: 243 mg/dL — ABNORMAL HIGH (ref <=200)
HDL CHOLESTEROL: 54 mg/dL (ref 40–60)
LDL CHOLESTEROL CALCULATED: 167 mg/dL — ABNORMAL HIGH (ref 40–99)
NON-HDL CHOLESTEROL: 189 mg/dL — ABNORMAL HIGH (ref 70–130)
TRIGLYCERIDES: 110 mg/dL (ref 0–150)
VLDL CHOLESTEROL CAL: 22 mg/dL (ref 9–37)

## 2021-09-27 NOTE — Unmapped (Signed)
[facility] Refill Coordination Note    Specialty Medication(s) to be Shipped:   Inflammatory Disorders: Humira    Other medication(s) to be shipped: No additional medications requested for fill at this time     Heather Watts, DOB: 02-10-1971  Phone: [phone] (home)       All above HIPAA information was verified with patient.     Was a Nurse, learning disability used for this call? No    Completed refill call assessment today to schedule patient's medication shipment from the [facility] [phone]).  All relevant notes have been reviewed.     Specialty medication(s) and dose(s) confirmed: Regimen is correct and unchanged.   Changes to medications: Geanine reports no changes at this time.  Changes to insurance: No  New side effects reported not previously addressed with a pharmacist or physician: None reported  Questions for the pharmacist: No    Confirmed patient received a Conservation officer, historic buildings and a Surveyor, mining with first shipment. The patient will receive a drug information handout for each medication shipped and additional FDA Medication Guides as required.       DISEASE/MEDICATION-SPECIFIC INFORMATION        For patients on injectable medications: Patient currently has 0 doses left.  Next injection is scheduled for ASAP-Due 09/25/21.    SPECIALTY MEDICATION ADHERENCE     Medication Adherence    Patient reported X missed doses in the last month: 1  Specialty Medication: Humira Cf 40 mg/0.4 ml  Patient is on additional specialty medications: No  Patient is on more than two specialty medications: No  Any gaps in refill history greater than 2 weeks in the last 3 months: no  Demonstrates understanding of importance of adherence: yes  Informant: patient  Reliability of informant: reliable  Confirmed plan for next specialty medication refill: delivery by pharmacy  Refills needed for supportive medications: not needed              Were doses missed due to medication being on hold? No    HUMIRA(CF) PEN 40 mg/0.4 mL : 0 days of medicine on hand     REFERRAL TO PHARMACIST     Referral to the pharmacist: Not needed      [facility]     Shipping address confirmed in Epic.     Delivery Scheduled: Yes, Expected medication delivery date: 09/29/21.     Medication will be delivered via Same Day Courier to the prescription address in Epic WAM.    Yolonda Kida   [facility] Specialty Technician

## 2021-09-28 DIAGNOSIS — M62838 Other muscle spasm: Principal | ICD-10-CM

## 2021-09-28 MED ORDER — CYCLOBENZAPRINE 5 MG TABLET
ORAL_TABLET | ORAL | 0 refills | 0.00000 days | Status: CP
Start: 2021-09-28 — End: 2022-09-28

## 2021-09-28 NOTE — Unmapped (Signed)
Your last note 04/30/2021 said to take Cyclobenzaprine at bedtime for chronic thoracic back pain.

## 2021-09-29 NOTE — Unmapped (Signed)
Heather Watts 's Humira shipment will be delayed as a result of the patient's insurance plan being in grace period (patient needs to pay insurance premium).     I have reached out to the patient  at [phone] and communicated the delay. We will reschedule the medication for the delivery date that the patient agreed upon.  We have confirmed the delivery date as 11/28, via same day courier.

## 2021-09-29 NOTE — Unmapped (Signed)
Patient is requesting the following refill  Requested Prescriptions     Pending Prescriptions Disp Refills   ??? cyclobenzaprine (FLEXERIL) 5 MG tablet 30 tablet 0     Sig: Take 1-2 tabs po tid prn       Recent Visits  Date Type Provider Dept   04/30/21 Office Visit Keri Rosita Fire, FNP [facility]   Showing recent visits within past 365 days with a meds authorizing provider and meeting all other requirements  Future Appointments  Date Type Provider Dept   05/02/22 Appointment Keri Rosita Fire, FNP [place] [facility]   Showing future appointments within next 365 days with a meds authorizing provider and meeting all other requirements

## 2021-10-02 ENCOUNTER — Ambulatory Visit: Admit: 2021-10-02 | Discharge status: Absent without leave | Payer: BLUE CROSS/BLUE SHIELD

## 2021-10-02 ENCOUNTER — Ambulatory Visit: Admit: 2021-10-02 | Discharge: 2021-10-03 | Payer: PRIVATE HEALTH INSURANCE

## 2021-10-02 DIAGNOSIS — I889 Nonspecific lymphadenitis, unspecified: Principal | ICD-10-CM

## 2021-10-02 MED ORDER — METHYLPREDNISOLONE 4 MG TABLETS IN A DOSE PACK
0 refills | 0.00000 days | Status: CP
Start: 2021-10-02 — End: ?

## 2021-10-02 MED ORDER — AMOXICILLIN 875 MG-POTASSIUM CLAVULANATE 125 MG TABLET
ORAL_TABLET | Freq: Two times a day (BID) | ORAL | 0 refills | 10 days | Status: CP
Start: 2021-10-02 — End: 2021-10-12

## 2021-10-02 NOTE — Unmapped (Signed)
He became  Name:  Heather Watts  DOB: July 09, 1971  Date: 10/02/2021    ASSESSMENT/PLAN:  Jeslyn was seen today for adenopathy.    Diagnoses and all orders for this visit:    Lymphadenitis  -     amoxicillin-clavulanate (AUGMENTIN) 875-125 mg per tablet; Take 1 tablet by mouth Two (2) times a day for 10 days.    Other orders  -     methylPREDNISolone (MEDROL DOSEPACK) 4 mg tablet; follow package directions      Heather Watts is a 50 y.o. female with a past medical history of HTN, HLD, GERD and HS who presented to the clinic with 3 days lymph node swelling in her neck.  Patient does have some anterior cervical lymphadenopathy on exam.  She has no sore throat and the throat is not erythematous.  She is had no fevers and really no other illnesses.  Patient was placed on Augmentin for 10 days which she is to take with food.  I also gave her Medrol Dosepak in case some of this is more of an inflammatory response.  I recommended that she follow-up with her PCP next week for recheck.  She is to go the emergency part if she develops increased swelling in her neck, any difficulty swallowing, any difficulty breathing, high fever or any other worsening symptoms.  She received preprinted instructions regarding lymphadenitis.  She expressed understanding of instructions prior to discharge and was discharged home in good condition.  ------------------------------------------------------------------------------    Chief Complaint   Patient presents with   ??? Adenopathy       HPI: Heather Watts is a 50 y.o. female with a past medical history of hypertension, hyperlipidemia, GERD and HS who presents to the clinic with swollen lymph nodes in her neck for the past 3 days.  Patient states she is just had a little mild runny nose with some postnasal drip but no significant sore throat or cough.  She denies any fevers, chills or sweats.  No difficulty breathing or difficulty swallowing.  She noted some feeling of drainage in her right ear but no actual ear pain.  She is not otherwise been sick recently.  She did state that she tested her oxygen at home with her pulse ox at home and it was 89 to 90%.  Today upon triage her sat is 98% on room air.  Patient states that she is typically on Humira and has not had it in several weeks due to a problem with getting it from the pharmacy.    ROS:  Review of systems as above.  Rest of review of systems negative unless otherwise noted as per HPI.    I have reviewed past medical, surgical, medications, allergies, social and family histories today and updated them in Epic where appropriate.    PMH:  Past Medical History:   Diagnosis Date   ??? COVID-19 11/06/2020   ??? GERD (gastroesophageal reflux disease)    ??? Hyperlipidemia    ??? Hypertension        SURGICAL HX:  No past surgical history on file.    MEDS:    Current Outpatient Medications:   ???  chlorthalidone (HYGROTON) 25 MG tablet, Take 1 tablet (25 mg total) by mouth daily., Disp: 90 tablet, Rfl: 0  ???  cyclobenzaprine (FLEXERIL) 5 MG tablet, Take 1-2 tabs po tid prn, Disp: 60 tablet, Rfl: 1  ???  doxycycline (VIBRAMYCIN) 100 MG capsule, Take one capsule by mouth twice daily., Disp:  60 capsule, Rfl: 0  ???  empty container Misc, Use as directed to dispose of Humira pens., Disp: 1 each, Rfl: 2  ???  empty container Misc, Use as directed, Disp: 1 each, Rfl: 2  ???  fluticasone propionate (FLONASE) 50 mcg/actuation nasal spray, 1 spray into each nostril daily., Disp: 16 g, Rfl: 0  ???  metoprolol succinate (TOPROL XL) 25 MG 24 hr tablet, Take 1 tablet (25 mg total) by mouth daily., Disp: 90 tablet, Rfl: 1  ???  omeprazole (PRILOSEC) 40 MG capsule, Take 40 mg by mouth daily. , Disp: , Rfl:   ???  potassium chloride (KLOR-CON) 20 MEQ CR tablet, Take 2 tablets (40 mEq total) by mouth daily. (Patient taking differently: Take 20 mEq by mouth daily.), Disp: 120 tablet, Rfl: 2  ???  amoxicillin-clavulanate (AUGMENTIN) 875-125 mg per tablet, Take 1 tablet by mouth Two (2) times a day for 10 days., Disp: 20 tablet, Rfl: 0  ???  HUMIRA PEN CITRATE FREE 40 MG/0.4 ML, Inject the contents of 1 pen (40 mg total) under the skin every seven (7) days. (Patient not taking: Reported on 10/02/2021), Disp: 4 each, Rfl: 11  ???  methylPREDNISolone (MEDROL DOSEPACK) 4 mg tablet, follow package directions, Disp: 1 each, Rfl: 0    ALL:  No Known Allergies    SH:  Social History     Tobacco Use   ??? Smoking status: Former     Packs/day: 1.00     Years: 14.00     Pack years: 14.00     Types: Cigarettes     Quit date: 11/08/2003     Years since quitting: 17.9   ??? Smokeless tobacco: Never   ??? Tobacco comments:     Quit smoking 2005   Vaping Use   ??? Vaping Use: Never used   Substance Use Topics   ??? Alcohol use: Not Currently   ??? Drug use: Never       FH:  Family History   Problem Relation Age of Onset   ??? Cancer Mother    ??? Hypertension Mother         Everyone on mother's side of family   ??? COPD Father    ??? Hyperlipidemia Father    ??? Hypertension Father    ??? Cancer Maternal Aunt    ??? Cancer Maternal Uncle    ??? Early death Maternal Uncle         Heart Attack   ??? Aneurysm Maternal Uncle    ??? Stroke Maternal Grandmother    ??? Melanoma Neg Hx    ??? Basal cell carcinoma Neg Hx    ??? Squamous cell carcinoma Neg Hx        VITALS:  Vitals:    10/02/21 1236   BP: 137/84   Pulse: 97   Resp: 18   Temp: 37.5 ??C (99.5 ??F)   SpO2: 98%     Body mass index is 35.73 kg/m??.    Physical Exam  Vitals and nursing note reviewed.   Constitutional:       General: She is not in acute distress.     Appearance: Normal appearance. She is not ill-appearing.   HENT:      Head: Normocephalic and atraumatic.      Right Ear: Tympanic membrane normal.      Left Ear: Tympanic membrane normal.      Mouth/Throat:      Mouth: Mucous membranes are moist.      Pharynx:  Oropharynx is clear. No oropharyngeal exudate or posterior oropharyngeal erythema.      Comments: Slightly enlarged tonsils bilaterally which patient states is chronic.  Eyes: Conjunctiva/sclera: Conjunctivae normal.      Pupils: Pupils are equal, round, and reactive to light.   Neck:      Comments: Anterior cervical lymphadenopathy bilaterally which is tender.  Cardiovascular:      Rate and Rhythm: Normal rate and regular rhythm.      Pulses: Normal pulses.      Heart sounds: Normal heart sounds. No murmur heard.    No friction rub. No gallop.   Pulmonary:      Effort: Pulmonary effort is normal. No respiratory distress.      Breath sounds: Normal breath sounds. No stridor. No wheezing, rhonchi or rales.   Abdominal:      Palpations: Abdomen is soft.      Tenderness: There is no abdominal tenderness.   Musculoskeletal:         General: No swelling. Normal range of motion.      Cervical back: Neck supple.   Lymphadenopathy:      Cervical: Cervical adenopathy present.   Skin:     General: Skin is warm and dry.      Findings: No rash.   Neurological:      General: No focal deficit present.      Mental Status: She is alert and oriented to person, place, and time.   Psychiatric:         Mood and Affect: Mood normal.         Behavior: Behavior normal.         TEST  RESULTS:    No results found for this visit on 10/02/21.    SCREENINGS:  SDOH:   No SDOH interventions needed at today's visit.    DEPRESSION:    PHQ-2 Score: 0    PHQ-9 Score:      Edinburgh Score:      Screening complete, no depression identified / no further action needed today        Dene Nazir L. Earlene Plater, MD  [facility] [place]  ----------------------------------------------------------------  Note - This record has been created using [employer]. Chart creation errors have been sought, but may not always have been located. Such creation errors do not reflect on the standard of medical care.

## 2021-10-02 NOTE — Unmapped (Signed)
Take the antibiotic with food.  Please be sure to follow-up with your PCP if your symptoms are not improving in the next week or so with the antibiotic and steroids.    Go to the emergency department for increased swelling in the neck, difficulty swallowing, difficulty breathing, high fever or any other worsening symptoms.

## 2021-10-04 MED FILL — HUMIRA PEN CITRATE FREE 40 MG/0.4 ML: SUBCUTANEOUS | 28 days supply | Qty: 4 | Fill #5

## 2021-10-22 NOTE — Unmapped (Signed)
[facility] Refill Coordination Note    Specialty Medication(s) to be Shipped:   Inflammatory Disorders: Humira    Other medication(s) to be shipped: No additional medications requested for fill at this time     Heather Watts, DOB: September 03, 1971  Phone: [phone] (home)       All above HIPAA information was verified with patient.     Was a Nurse, learning disability used for this call? No    Completed refill call assessment today to schedule patient's medication shipment from the [facility] [phone]).  All relevant notes have been reviewed.     Specialty medication(s) and dose(s) confirmed: Regimen is correct and unchanged.   Changes to medications: Heather Watts reports no changes at this time.  Changes to insurance: No  New side effects reported not previously addressed with a pharmacist or physician: None reported  Questions for the pharmacist: No    Confirmed patient received a Conservation officer, historic buildings and a Surveyor, mining with first shipment. The patient will receive a drug information handout for each medication shipped and additional FDA Medication Guides as required.       DISEASE/MEDICATION-SPECIFIC INFORMATION        For patients on injectable medications: Patient currently has 1 doses left.  Next injection is scheduled for 10/25/2021.    SPECIALTY MEDICATION ADHERENCE     Medication Adherence    Patient reported X missed doses in the last month: 0  Specialty Medication: Humira Cf 40 mg/0.4 ml  Patient is on additional specialty medications: No  Any gaps in refill history greater than 2 weeks in the last 3 months: no  Demonstrates understanding of importance of adherence: yes  Informant: patient  Reliability of informant: reliable  Confirmed plan for next specialty medication refill: delivery by pharmacy  Refills needed for supportive medications: not needed              Were doses missed due to medication being on hold? No      REFERRAL TO PHARMACIST     Referral to the pharmacist: Not needed      [facility]     Shipping address confirmed in Epic.     Delivery Scheduled: Yes, Expected medication delivery date: 10/27/2021.     Medication will be delivered via Same Day Courier to the prescription address in Epic WAM.    Heather Watts   [facility] Specialty Technician

## 2021-10-27 MED FILL — HUMIRA PEN CITRATE FREE 40 MG/0.4 ML: SUBCUTANEOUS | 28 days supply | Qty: 4 | Fill #6

## 2021-11-14 NOTE — Unmapped (Signed)
Error   This encounter was created in error - please disregard.

## 2021-11-15 MED ORDER — OMEPRAZOLE 40 MG CAPSULE,DELAYED RELEASE
ORAL_CAPSULE | Freq: Every day | ORAL | 1 refills | 90.00000 days
Start: 2021-11-15 — End: 2022-11-15

## 2021-11-16 MED ORDER — OMEPRAZOLE 40 MG CAPSULE,DELAYED RELEASE
ORAL_CAPSULE | Freq: Every day | ORAL | 1 refills | 90 days | Status: CP
Start: 2021-11-16 — End: 2022-11-16

## 2021-11-16 NOTE — Unmapped (Signed)
Patient is requesting the following refill  Requested Prescriptions     Pending Prescriptions Disp Refills   ??? omeprazole (PRILOSEC) 40 MG capsule 90 capsule 1     Sig: Take 1 capsule (40 mg total) by mouth daily.       Recent Visits  Date Type Provider Dept   04/30/21 Office Visit Keri Rosita Fire, FNP [facility]   Showing recent visits within past 365 days with a meds authorizing provider and meeting all other requirements  Future Appointments  Date Type Provider Dept   05/02/22 Appointment Keri Rosita Fire, FNP [facility]   Showing future appointments within next 365 days with a meds authorizing provider and meeting all other requirements       Labs: Not applicable this refill

## 2021-11-18 NOTE — Unmapped (Signed)
Prior authorization submitted and denied for omeprazole.

## 2021-11-18 NOTE — Unmapped (Signed)
[facility] Refill Coordination Note    Specialty Medication(s) to be Shipped:   Inflammatory Disorders: Humira    Other medication(s) to be shipped: No additional medications requested for fill at this time     Heather Watts, DOB: 1970/11/24  Phone: [phone] (home)       All above HIPAA information was verified with patient.     Was a Nurse, learning disability used for this call? No    Completed refill call assessment today to schedule patient's medication shipment from the [facility] [phone]).  All relevant notes have been reviewed.     Specialty medication(s) and dose(s) confirmed: Regimen is correct and unchanged.   Changes to medications: [name] reports no changes at this time.  Changes to insurance: No  New side effects reported not previously addressed with a pharmacist or physician: None reported  Questions for the pharmacist: No    Confirmed patient received a Conservation officer, historic buildings and a Surveyor, mining with first shipment. The patient will receive a drug information handout for each medication shipped and additional FDA Medication Guides as required.       DISEASE/MEDICATION-SPECIFIC INFORMATION        For patients on injectable medications: Patient currently has 1 doses left.  Next injection is scheduled for 11/22/21.    SPECIALTY MEDICATION ADHERENCE     Medication Adherence    Patient reported X missed doses in the last month: 0  Specialty Medication: Humira  Patient is on additional specialty medications: No              Were doses missed due to medication being on hold? No    Humira 40/0.4 mg/ml: 4 days of medicine on hand        REFERRAL TO PHARMACIST     Referral to the pharmacist: Not needed      [facility]     Shipping address confirmed in Epic.     Delivery Scheduled: Yes, Expected medication delivery date: 11/26/21.     Medication will be delivered via Same Day Courier to the prescription address in Epic WAM.    Unk Lightning   [facility] Specialty Technician

## 2021-11-26 MED ORDER — FLUTICASONE PROPIONATE 50 MCG/ACTUATION NASAL SPRAY,SUSPENSION
Freq: Every day | NASAL | 0 refills | 123 days | Status: CP
Start: 2021-11-26 — End: 2022-11-26

## 2021-11-26 MED FILL — HUMIRA PEN CITRATE FREE 40 MG/0.4 ML: SUBCUTANEOUS | 28 days supply | Qty: 4 | Fill #7

## 2021-11-26 NOTE — Unmapped (Signed)
Patient is requesting the following refill  Requested Prescriptions     Pending Prescriptions Disp Refills   ??? fluticasone propionate (FLONASE) 50 mcg/actuation nasal spray [Pharmacy Med Name: FLUTICASONE NASAL SP (120) RX] 16 g 0     Sig: SHAKE LIQUID AND USE 1 SPRAY IN EACH NOSTRIL 1 TIME AS NEEDED FOR RHINITIS       Recent Visits  Date Type Provider Dept   04/30/21 Office Visit Keri Rosita Fire, FNP [facility]   Showing recent visits within past 365 days with a meds authorizing provider and meeting all other requirements  Future Appointments  Date Type Provider Dept   05/02/22 Appointment Keri Rosita Fire, FNP [place] [facility]   Showing future appointments within next 365 days with a meds authorizing provider and meeting all other requirements

## 2021-11-28 NOTE — Unmapped (Unsigned)
[place] Hydrologist Encounter  This medical encounter was conducted virtually using [email or web address]  [email or web address] protocols.     Patient ID: Heather Watts is a 51 y.o. female          No chief complaint on file.            HPI          Pt is a yo ..female presenting for an [facility]. Pt c/o sxs of that started on    Pt has tried Txs    No Exacerbating/Alleviating factors.    No sick contacts with similar sxs.    No Fever/Chills, Loss of taste/Smell, Nasal congestion, Nasal discharge, Headache, General body aches, cp/ct, gu/gi sxs, Non -harsh- productive cough, sob, or other pain.          Time spent reviewing treatment plan and Pt v/l understanding.          I have reviewed past medical, surgical, medication, allergy, social and family histories today and updated them in Epic where appropriate.          The provider was located in [place] at their office The patient was located in          Current Outpatient Medications   Medication Sig Dispense Refill   ??? chlorthalidone (HYGROTON) 25 MG tablet Take 1 tablet (25 mg total) by mouth daily. 90 tablet 0   ??? cyclobenzaprine (FLEXERIL) 5 MG tablet Take 1-2 tabs po tid prn 60 tablet 1   ??? doxycycline (VIBRAMYCIN) 100 MG capsule Take one capsule by mouth twice daily. 60 capsule 0   ??? empty container Misc Use as directed to dispose of Humira pens. 1 each 2   ??? empty container Misc Use as directed 1 each 2   ??? fluticasone propionate (FLONASE) 50 mcg/actuation nasal spray 1 spray into each nostril daily. 16 g 0   ??? HUMIRA PEN CITRATE FREE 40 MG/0.4 ML Inject the contents of 1 pen (40 mg total) under the skin every seven (7) days. 4 each 11   ??? methylPREDNISolone (MEDROL DOSEPACK) 4 mg tablet follow package directions 1 each 0   ??? metoprolol succinate (TOPROL XL) 25 MG 24 hr tablet Take 1 tablet (25 mg total) by mouth daily. 90 tablet 1   ??? omeprazole (PRILOSEC) 40 MG capsule Take 1 capsule (40 mg total) by mouth daily. 90 capsule 1   ??? potassium chloride (KLOR-CON) 20 MEQ CR tablet Take 2 tablets (40 mEq total) by mouth daily. (Patient taking differently: Take 20 mEq by mouth daily.) 120 tablet 2     No current facility-administered medications for this visit.             No Known Allergies          Past Medical History:   Diagnosis Date   ??? COVID-19 11/06/2020   ??? GERD (gastroesophageal reflux disease)    ??? Hyperlipidemia    ??? Hypertension              No past surgical history on file.          Family History   Problem Relation Age of Onset   ??? Cancer Mother    ??? Hypertension Mother         Everyone on mother's side of family   ??? COPD Father    ??? Hyperlipidemia Father    ??? Hypertension Father    ??? Cancer Maternal Aunt    ??? Cancer Maternal Uncle    ???  Early death Maternal Uncle         Heart Attack   ??? Aneurysm Maternal Uncle    ??? Stroke Maternal Grandmother    ??? Melanoma Neg Hx    ??? Basal cell carcinoma Neg Hx    ??? Squamous cell carcinoma Neg Hx              Social History     Tobacco Use   ??? Smoking status: Former     Packs/day: 1.00     Years: 14.00     Pack years: 14.00     Types: Cigarettes     Quit date: 11/08/2003     Years since quitting: 18.0   ??? Smokeless tobacco: Never   ??? Tobacco comments:     Quit smoking 2005   Substance Use Topics   ??? Alcohol use: Not Currently             Review of Systems          OBJECTIVE:    There were no vitals filed for this visit.    Physical Exam          Assessment/Plan:     There are no diagnoses linked to this encounter.    Follow Up:    Recommend patient F/U with PCP in 3-5 days sooner for new, non resolving, or worsening symptoms. For life threatening symptoms, proceed to ER or call 911.          All questions answered regarding diagnoses, more serious signs and symptoms and medications prescribed. Patient voices understanding. Patient instructions sent via Mychart regarding diagnosis and treatment.       Time spent pre-visit in review of chart and records 5 minutes, time spent with patient during the visit 10 minutes.      Lawson Fiscal, FNP-C Synchronous _audio/telephone/doximity_ technology was used to conduct this virtual visit. _Patient_ consented to such, understanding limitations and alternatives. Patient name, date of birth and location were verified prior to visit. The encounter was not recorded.

## 2021-11-29 ENCOUNTER — Institutional Professional Consult (permissible substitution): Admit: 2021-11-29 | Discharge: 2021-11-30 | Payer: PRIVATE HEALTH INSURANCE | Attending: Family | Primary: Family

## 2021-11-29 NOTE — Unmapped (Signed)
TeleHealth Virtual Encounter  This medical encounter was conducted virtually using [email or web address]  TeleHealth protocols.          The patient reports they are currently: at home. I spent 11 minutes on the phone with the patient on the date of service. I spent an additional 5 minutes on pre- and post-visit activities on the date of service.     The patient was physically located in [place] or a state in which I am permitted to provide care. The patient and/or parent/guardian understood that s/he may incur co-pays and cost sharing, and agreed to the telemedicine visit. The visit was reasonable and appropriate under the circumstances given the patient's presentation at the time.    The patient and/or parent/guardian has been advised of the potential risks and limitations of this mode of treatment (including, but not limited to, the absence of in-person examination) and has agreed to be treated using telemedicine. The patient's/patient's family's questions regarding telemedicine have been answered.     If the visit was completed in an ambulatory setting, the patient and/or parent/guardian has also been advised to contact their provider???s office for worsening conditions, and seek emergency medical treatment and/or call 911 if the patient deems either necessary.           In case we get disconnected, patient's phone number is [phone] (home)      They are aware this is a telehealth/virtual encounter and may be billed through their insurance.      For technical issues during a virtual visit call: [phone] (for providers only)   For interpreter services: call [phone], code [phone]    Assessment/Plan:            Nasal congestion    patient agrees she doesn't feel sick, but has intermittent nasal congestion- flonase makes PND worse, states she isn't getting anything when she blows, but feels like she has trouble breathing- no facial pain, fever or other signs of URI.  Happens monthly- patient states she is sensitive to meds, doesn't like to take anything because it has the opposite effect, but may try zyrtec, nasal saline or neti pot.  Reviewed with no in person exam, unable to detect turbinate hypertrophy, will follow up with pcp vs ent as needed. Reviewed red flags and indications for immediate follow up.   Increase oral hydration and continue humidifier as this helped last night            Medication adherence and barriers to the treatment plan have been addressed. Opportunities to optimize healthy behaviors have been discussed. Patient / caregiver voiced understanding.      SUBJECTIVE      Patient ID: Heather Watts is a 51 y.o. female who presents by virtual interaction for evaluation of nasal congestion     HPI         Patient states she has had a sore throat and nasal congestion (but no production when she blows her nose) for the last 3 days, states she feels like she has had this before, usually once monthly, wonders if it might be allergies.  She states she feels better today, has PND.   She ran the humidifer last night, done eucalyptus essential oil in diffuser.  She states when she lays down, it goes to the dependent side, then when she lays on the other side it goes to the other side.  She uses flonase but it causes drainage down her throat.    Review of Systems  Constitutional:  No Fatige and malaise  ENT: No HA, congestion  CV: No chest pain or palpitations  Respiratory: No SOB or difficulty breathing, no cough  PSYCH: No anxiety or depression. No SI or HI.        Objective:        Wt Readings from Last 3 Encounters:   10/02/21 (!) 106.6 kg (235 lb)   04/30/21 (!) 108.9 kg (240 lb)   01/29/21 (!) 110.7 kg (244 lb)     Temp Readings from Last 3 Encounters:   10/02/21 37.5 ??C (99.5 ??F) (Temporal)   04/30/21 36.7 ??C (98 ??F) (Oral)   01/02/21 36.7 ??C (98 ??F) (Oral)     BP Readings from Last 3 Encounters:   10/02/21 137/84   04/30/21 120/80   01/29/21 140/101     Pulse Readings from Last 3 Encounters: 10/02/21 97   04/30/21 94   01/29/21 90     Estimated body mass index is 35.73 kg/m?? as calculated from the following:    Height as of 10/02/21: 172.7 cm (5' 8).    Weight as of 10/02/21: 106.6 kg (235 lb).  No height and weight on file for this encounter.          Physical Exam:  Constitutional:  no vitals to review   General appearance or sound of patient:  NAD  EENMT: hearing seems intact  Resp:  able to carry on conversation without shortness of breath, no audible wheezing appreciated from this limited perspective   Psych: normal judgment, normal insight, able to converse regularly and with appropriate affect        Past Medical/Surgical History:     Past Medical History:   Diagnosis Date   ??? COVID-19 11/06/2020   ??? GERD (gastroesophageal reflux disease)    ??? Hyperlipidemia    ??? Hypertension      No past surgical history on file.    Family History:     Family History   Problem Relation Age of Onset   ??? Cancer Mother    ??? Hypertension Mother         Everyone on mother's side of family   ??? COPD Father    ??? Hyperlipidemia Father    ??? Hypertension Father    ??? Cancer Maternal Aunt    ??? Cancer Maternal Uncle    ??? Early death Maternal Uncle         Heart Attack   ??? Aneurysm Maternal Uncle    ??? Stroke Maternal Grandmother    ??? Melanoma Neg Hx    ??? Basal cell carcinoma Neg Hx    ??? Squamous cell carcinoma Neg Hx        Allergies:     Patient has no known allergies.    Current Medications:     Current Outpatient Medications   Medication Sig Dispense Refill   ??? chlorthalidone (HYGROTON) 25 MG tablet Take 1 tablet (25 mg total) by mouth daily. 90 tablet 0   ??? cyclobenzaprine (FLEXERIL) 5 MG tablet Take 1-2 tabs po tid prn 60 tablet 1   ??? doxycycline (VIBRAMYCIN) 100 MG capsule Take one capsule by mouth twice daily. 60 capsule 0   ??? empty container Misc Use as directed to dispose of Humira pens. 1 each 2   ??? empty container Misc Use as directed 1 each 2   ??? fluticasone propionate (FLONASE) 50 mcg/actuation nasal spray 1 spray into each nostril daily. 16 g 0   ??? HUMIRA PEN CITRATE  FREE 40 MG/0.4 ML Inject the contents of 1 pen (40 mg total) under the skin every seven (7) days. 4 each 11   ??? methylPREDNISolone (MEDROL DOSEPACK) 4 mg tablet follow package directions 1 each 0   ??? metoprolol succinate (TOPROL XL) 25 MG 24 hr tablet Take 1 tablet (25 mg total) by mouth daily. 90 tablet 1   ??? omeprazole (PRILOSEC) 40 MG capsule Take 1 capsule (40 mg total) by mouth daily. 90 capsule 1   ??? potassium chloride (KLOR-CON) 20 MEQ CR tablet Take 2 tablets (40 mEq total) by mouth daily. (Patient taking differently: Take 20 mEq by mouth daily.) 120 tablet 2     No current facility-administered medications for this visit.       I have reviewed past medical, surgical, medication, allergy, social and family histories today and updated them in Epic where appropriate.

## 2021-12-02 MED ORDER — CHLORTHALIDONE 25 MG TABLET
ORAL_TABLET | Freq: Every day | ORAL | 1 refills | 90 days | Status: CP
Start: 2021-12-02 — End: 2022-12-02

## 2021-12-13 MED ORDER — AMLODIPINE 10 MG TABLET
ORAL_TABLET | 3 refills | 0 days
Start: 2021-12-13 — End: ?

## 2021-12-13 NOTE — Unmapped (Signed)
Recent:   What is the date of your last related visit?  May 2022 Cardiology  Related acute medications Rx'd:  norvasc  Home treatment tried: motrin      Relevant:   Allergies: Patient has no known allergies.  Medications: metoprolol  Health History: HTN, hyperlipidemia, GERD, HS  Weight: n/a      /[place] Cancer patients only:  What was the date of your last cancer treatment (mm/dd/yy)?: n/a  Was the treatment oral or infusion?: n/a  Are you currently on TVEC (yes/no)?: n/a    Reason for Disposition  ??? [1] Chest pain(s) lasting a few seconds AND [2] persists > 3 days    Answer Assessment - Initial Assessment Questions  1. LOCATION: Where does it hurt?        Left of center in chest  2. RADIATION: Does the pain go anywhere else? (e.g., into neck, jaw, arms, back)     denies  3. ONSET: When did the chest pain begin? (Minutes, hours or days)       12/10/21  4. PATTERN Does the pain come and go, or has it been constant since it started?  Does it get worse with exertion?       Comes anda goes  5. DURATION: How long does it last (e.g., seconds, minutes, hours)      Less than a minute  6. SEVERITY: How bad is the pain?  (e.g., Scale 1-10; mild, moderate, or severe)     - MILD (1-3): doesn't interfere with normal activities      - MODERATE (4-7): interferes with normal activities or awakens from sleep     - SEVERE (8-10): excruciating pain, unable to do any normal activities        5/10  7. CARDIAC RISK FACTORS: Do you have any history of heart problems or risk factors for heart disease? (e.g., angina, prior heart attack; diabetes, high blood pressure, high cholesterol, smoker, or strong family history of heart disease)      Yes, HTN, hyperlipidemia, h/o smoking (none since 2006) , strong family history  8. PULMONARY RISK FACTORS: Do you have any history of lung disease?  (e.g., blood clots in lung, asthma, emphysema, birth control pills)      denies  9. CAUSE: What do you think is causing the chest pain? Not sure, they seem not to be able to find a cause  10. OTHER SYMPTOMS: Do you have any other symptoms? (e.g., dizziness, nausea, vomiting, sweating, fever, difficulty breathing, cough)        denies  11. PREGNANCY: Is there any chance you are pregnant? When was your last menstrual period?        N/a LMP 3 weeks ago    Protocols used: CHEST PAIN-A-AH    BP 145/97, averaging 135-150/95-100  See PCP Within 2 Weeks     Prompt Disposition/Alternative Protocol     [1] Systolic BP ??>= 161 OR Diastolic >= 80 AND [2] taking BP medications   Reason: may need medication adjustment or new medicine; treatment goal usually is under 130/80 for most people; goal may be higher in elderly and based on other factors.

## 2021-12-13 NOTE — Unmapped (Addendum)
Copied from original note 12/12/21 due to Epic issues    Recent:   What is the date of your last related visit?  May 2022 Cardiology  Related acute medications Rx'd:  norvasc  Home treatment tried: motrin  ??  ??  Relevant:   Allergies: Patient has no known allergies.  Medications: metoprolol  Health History: HTN, hyperlipidemia, GERD, HS  Weight: n/a  ??  ??  [place] Cancer patients only:  What was the date of your last cancer treatment (mm/dd/yy)?: n/a  Was the treatment oral or infusion?: n/a  Are you currently on TVEC (yes/no)?: n/a  ??  Reason for Disposition  ??? [1] Chest pain(s) lasting a few seconds AND [2] persists > 3 days    Answer Assessment - Initial Assessment Questions  1. LOCATION: Where does it hurt?        Left of center in chest  2. RADIATION: Does the pain go anywhere else? (e.g., into neck, jaw, arms, back)     denies  3. ONSET: When did the chest pain begin? (Minutes, hours or days)       12/10/21  4. PATTERN Does the pain come and go, or has it been constant since it started?  Does it get worse with exertion?       Comes anda goes  5. DURATION: How long does it last (e.g., seconds, minutes, hours)      Less than a minute  6. SEVERITY: How bad is the pain?  (e.g., Scale 1-10; mild, moderate, or severe)     - MILD (1-3): doesn't interfere with normal activities      - MODERATE (4-7): interferes with normal activities or awakens from sleep     - SEVERE (8-10): excruciating pain, unable to do any normal activities        5/10  7. CARDIAC RISK FACTORS: Do you have any history of heart problems or risk factors for heart disease? (e.g., angina, prior heart attack; diabetes, high blood pressure, high cholesterol, smoker, or strong family history of heart disease)      Yes, HTN, hyperlipidemia, h/o smoking (none since 2006) , strong family history  8. PULMONARY RISK FACTORS: Do you have any history of lung disease?  (e.g., blood clots in lung, asthma, emphysema, birth control pills) denies  9. CAUSE: What do you think is causing the chest pain?      Not sure, they seem not to be able to find a cause  10. OTHER SYMPTOMS: Do you have any other symptoms? (e.g., dizziness, nausea, vomiting, sweating, fever, difficulty breathing, cough)        denies  11. PREGNANCY: Is there any chance you are pregnant? When was your last menstrual period?        N/a LMP 3 weeks ago    Protocols used: CHEST PAIN-A-AH  ??  BP 145/97, averaging 135-150/95-100  See PCP Within 2 Weeks  ??  ?? Prompt Disposition/Alternative Protocol     [1] Systolic BP ??>= 161 OR Diastolic >= 80 AND [2] taking BP medications   Reason: may need medication adjustment or new medicine; treatment goal usually is under 130/80 for most people; goal may be higher in elderly and based on other factors.

## 2021-12-13 NOTE — Unmapped (Deleted)
Attempt by charge nurse to remove from triage queue.

## 2021-12-15 DIAGNOSIS — I10 Essential (primary) hypertension: Principal | ICD-10-CM

## 2021-12-15 MED ORDER — AMLODIPINE 10 MG TABLET
ORAL_TABLET | Freq: Every day | ORAL | 1 refills | 90 days | Status: CP
Start: 2021-12-15 — End: 2022-12-15

## 2021-12-15 NOTE — Unmapped (Signed)
This record was created as a result of user error.  Please disregard.

## 2021-12-16 ENCOUNTER — Ambulatory Visit: Admit: 2021-12-16 | Discharge: 2021-12-16 | Payer: PRIVATE HEALTH INSURANCE

## 2021-12-16 DIAGNOSIS — R252 Cramp and spasm: Principal | ICD-10-CM

## 2021-12-16 DIAGNOSIS — R079 Chest pain, unspecified: Principal | ICD-10-CM

## 2021-12-16 DIAGNOSIS — M549 Dorsalgia, unspecified: Principal | ICD-10-CM

## 2021-12-16 LAB — COMPREHENSIVE METABOLIC PANEL
ALBUMIN: 4.1 g/dL (ref 3.4–5.0)
ALKALINE PHOSPHATASE: 55 U/L (ref 46–116)
ALT (SGPT): 28 U/L (ref 10–49)
ANION GAP: 7 mmol/L (ref 5–14)
AST (SGOT): 21 U/L (ref <=34)
BILIRUBIN TOTAL: 0.6 mg/dL (ref 0.3–1.2)
BLOOD UREA NITROGEN: 13 mg/dL (ref 9–23)
BUN / CREAT RATIO: 17
CALCIUM: 10.8 mg/dL — ABNORMAL HIGH (ref 8.7–10.4)
CHLORIDE: 103 mmol/L (ref 98–107)
CO2: 32.5 mmol/L — ABNORMAL HIGH (ref 20.0–31.0)
CREATININE: 0.76 mg/dL
EGFR CKD-EPI (2021) FEMALE: >90 mL/min/{1.73_m2} (ref >=60)
GLUCOSE RANDOM: 109 mg/dL (ref 70–179)
POTASSIUM: 3.7 mmol/L (ref 3.4–4.8)
PROTEIN TOTAL: 7.9 g/dL (ref 5.7–8.2)
SODIUM: 142 mmol/L (ref 135–145)

## 2021-12-16 NOTE — Unmapped (Signed)
Name:  Heather Watts  DOB: November 07, 1971  Date: 12/16/2021    ASSESSMENT/PLAN:  Heather Watts was seen today for chest pain.    Diagnoses and all orders for this visit:    Chest pain, unspecified type  -     ECG 12 Lead; Future  -     ECG 12 Lead  -     Comprehensive metabolic panel    Back pain, unspecified back location, unspecified back pain laterality, unspecified chronicity    Muscle cramps  -     Comprehensive metabolic panel      Heather Watts is a 51 y.o. female who presented to the clinic today with some complaints of chest pain, neck and back pain, abdominal tightness.  Patient states that she does have a history of muscle cramping and tightness around the time that she has been messing, states that the symptoms has been worse that she has been approaching menopause.  States that her last was 3 months ago, and that typically she takes cyclobenzaprine for muscle aches symptoms.  Patient came to the clinic today to do a cardiac work-up to make sure that her body aches and chest pain were not cardiac in nature.  Explained to patient that while her EKG was normal if she is got any further concerns she should follow-up with the ER for further testing.  Advised patient that her blood pressure and heart rate were elevated while in clinic, patient states that this is her normal.  BP and heart rate were rechecked prior to release BP remained stable but pulse reduced to  From 108 to 92. patient described the pain in her chest as achy, states that it comes and goes, worsens with inhalation, has improved since Saturday, states that pain seems worse when laying on her back or extending her chest upward.  Advised patient that if she were to experience any shortness of breath, worsening chest pain, no improvement in chest pain, nausea, vomiting, diarrhea to follow-up with the ER immediately for further testing.  Advised patient that she can take her cyclobenzaprine as prescribed for muscle and body aches.  Patient states that she was concerned about her potassium levels due to the frequency of cramping, requested testing in clinic.  We will follow with CMP results when they arrive.  Your condition is potentially life-threatening.  As such we recommend that you should go to the emergency room NOW for further evaluation and treatment. If your condition worsens we recommend that you receive another evaluation at the emergency room immediately or contact your primary medical clinics after hours call service to discuss your concerns. You must understand that you've received an Urgent Care treatment only and that you may be released before all of your medical problems are known or treated. You, the patient, will arrange for follow up care as instructed.  ------------------------------------------------------------------------------    Chief Complaint   Patient presents with   ??? Chest Pain     Onset of sx Saturday. Pain in upper chest and into shoulders with movement and breathing. Denies sob, palpitations, diaphoresis or radiation of pain. Recently had bp meds changed and is picking rx up todaoy. Concern bc she had pericardial efflusion last year, had resolved after seeing cards.        HPI: Heather Watts is a 51 y.o. female HPI States that chest pain started on Friday, that it is central/left of chest. Denies any SOB, chest pressure. Described CP as achy, not sharp, and intermittent it  comes and goes. CP improves when she rubbs her chest. States that her neck and back are tight and sore. Reports feeling like a band around her waist when she takes a deep breath; states that this symptom has improved. Hx of pericardial effusion 1 year ago; states that her current symptoms do not feel the same as that. Reports that she usually gets body aches when she is PMSing, which she currently is.   ROS:  Review of systems as above.  Rest of review of systems negative unless otherwise noted as per HPI.    I have reviewed past medical, surgical, medications, allergies, social and family histories today and updated them in Epic where appropriate.    PMH:  Past Medical History:   Diagnosis Date   ??? COVID-19 11/06/2020   ??? GERD (gastroesophageal reflux disease)    ??? Hyperlipidemia    ??? Hypertension        SURGICAL HX:  No past surgical history on file.    MEDS:    Current Outpatient Medications:   ???  amLODIPine (NORVASC) 10 MG tablet, Take 1 tablet (10 mg total) by mouth daily., Disp: 90 tablet, Rfl: 1  ???  chlorthalidone (HYGROTON) 25 MG tablet, Take 1 tablet (25 mg total) by mouth daily. TAKE 1 TABLET(25 MG) BY MOUTH DAILY, Disp: 90 tablet, Rfl: 1  ???  cyclobenzaprine (FLEXERIL) 5 MG tablet, Take 1-2 tabs po tid prn, Disp: 60 tablet, Rfl: 1  ???  empty container Misc, Use as directed to dispose of Humira pens., Disp: 1 each, Rfl: 2  ???  empty container Misc, Use as directed, Disp: 1 each, Rfl: 2  ???  fluticasone propionate (FLONASE) 50 mcg/actuation nasal spray, 1 spray into each nostril daily., Disp: 16 g, Rfl: 0  ???  HUMIRA PEN CITRATE FREE 40 MG/0.4 ML, Inject the contents of 1 pen (40 mg total) under the skin every seven (7) days., Disp: 4 each, Rfl: 11  ???  metoprolol succinate (TOPROL XL) 25 MG 24 hr tablet, Take 1 tablet (25 mg total) by mouth daily., Disp: 90 tablet, Rfl: 1  ???  omeprazole (PRILOSEC) 40 MG capsule, Take 1 capsule (40 mg total) by mouth daily., Disp: 90 capsule, Rfl: 1  ???  potassium chloride (KLOR-CON) 20 MEQ CR tablet, Take 2 tablets (40 mEq total) by mouth daily. (Patient taking differently: Take 20 mEq by mouth daily.), Disp: 120 tablet, Rfl: 2  ???  doxycycline (VIBRAMYCIN) 100 MG capsule, Take one capsule by mouth twice daily. (Patient not taking: Reported on 12/16/2021), Disp: 60 capsule, Rfl: 0  ???  methylPREDNISolone (MEDROL DOSEPACK) 4 mg tablet, follow package directions (Patient not taking: Reported on 12/16/2021), Disp: 1 each, Rfl: 0    ALL:  No Known Allergies    SH:  Social History     Tobacco Use   ??? Smoking status: Former Packs/day: 1.00     Years: 14.00     Pack years: 14.00     Types: Cigarettes     Quit date: 11/08/2003     Years since quitting: 18.1   ??? Smokeless tobacco: Never   ??? Tobacco comments:     Quit smoking 2005   Vaping Use   ??? Vaping Use: Never used   Substance Use Topics   ??? Alcohol use: Not Currently   ??? Drug use: Never       FH:  Family History   Problem Relation Age of Onset   ??? Cancer Mother    ???  Hypertension Mother         Everyone on mother's side of family   ??? COPD Father    ??? Hyperlipidemia Father    ??? Hypertension Father    ??? Cancer Maternal Aunt    ??? Cancer Maternal Uncle    ??? Early death Maternal Uncle         Heart Attack   ??? Aneurysm Maternal Uncle    ??? Stroke Maternal Grandmother    ??? Melanoma Neg Hx    ??? Basal cell carcinoma Neg Hx    ??? Squamous cell carcinoma Neg Hx        VITALS:  Vitals:    12/16/21 0910   BP: 140/93   Pulse: 92   Resp:    Temp:    SpO2: 98%     Body mass index is 34.97 kg/m??.    Physical Exam  Constitutional:       Appearance: Normal appearance.   HENT:      Head: Normocephalic and atraumatic.      Right Ear: Tympanic membrane normal.      Left Ear: Tympanic membrane normal.      Nose: Nose normal.      Mouth/Throat:      Mouth: Mucous membranes are moist.      Pharynx: Oropharynx is clear.   Eyes:      General: Lids are normal. Vision grossly intact.      Extraocular Movements: Extraocular movements intact.      Conjunctiva/sclera: Conjunctivae normal.      Pupils: Pupils are equal, round, and reactive to light.   Cardiovascular:      Rate and Rhythm: Regular rhythm. Tachycardia present.      Heart sounds: Normal heart sounds.   Pulmonary:      Effort: Pulmonary effort is normal.      Breath sounds: Normal breath sounds and air entry.   Chest:      Chest wall: No mass, deformity, swelling or tenderness.   Abdominal:      Palpations: Abdomen is soft.   Musculoskeletal:      Cervical back: Normal range of motion and neck supple.   Neurological:      Mental Status: She is alert and oriented to person, place, and time.         TEST  RESULTS:    No results found for this visit on 12/16/21.    SCREENINGS:    DEPRESSION:    PHQ-2 Score: 0    PHQ-9 Score:      Edinburgh Score:      Screening complete, no depression identified / no further action needed today    Armida Sans, FNP discussed with Heather Watts of having a PCP for their ongoing care. Patient has a PCP outside the [facility].          Collie Siad, FNP-C  [facility] [place]  ----------------------------------------------------------------  Note - This record has been created using [employer]. Chart creation errors have been sought, but may not always have been located. Such creation errors do not reflect on the standard of medical care.

## 2021-12-16 NOTE — Unmapped (Signed)
Recent:   What is the date of your last related visit?  May 2022 Cardiology  Related acute medications Rx'd:  norvasc  Home treatment tried: motrin        Relevant:   Allergies: Patient has no known allergies.  Medications: metoprolol  Health History: HTN, hyperlipidemia, GERD, HS  Weight: n/a        [place] Cancer patients only:  What was the date of your last cancer treatment (mm/dd/yy)?: n/a  Was the treatment oral or infusion?: n/a  Are you currently on TVEC (yes/no)?: n/a     Reason for Disposition  ??? [1] Chest pain(s) lasting a few seconds AND [2] persists > 3 days     Answer Assessment - Initial Assessment Questions  1. LOCATION: Where does it hurt?        Left of center in chest  2. RADIATION: Does the pain go anywhere else? (e.g., into neck, jaw, arms, back)     denies  3. ONSET: When did the chest pain begin? (Minutes, hours or days)       12/10/21  4. PATTERN Does the pain come and go, or has it been constant since it started?  Does it get worse with exertion?       Comes anda goes  5. DURATION: How long does it last (e.g., seconds, minutes, hours)      Less than a minute  6. SEVERITY: How bad is the pain?  (e.g., Scale 1-10; mild, moderate, or severe)     - MILD (1-3): doesn't interfere with normal activities      - MODERATE (4-7): interferes with normal activities or awakens from sleep     - SEVERE (8-10): excruciating pain, unable to do any normal activities        5/10  7. CARDIAC RISK FACTORS: Do you have any history of heart problems or risk factors for heart disease? (e.g., angina, prior heart attack; diabetes, high blood pressure, high cholesterol, smoker, or strong family history of heart disease)      Yes, HTN, hyperlipidemia, h/o smoking (none since 2006) , strong family history  8. PULMONARY RISK FACTORS: Do you have any history of lung disease?  (e.g., blood clots in lung, asthma, emphysema, birth control pills)      denies  9. CAUSE: What do you think is causing the chest pain?      Not sure, they seem not to be able to find a cause  10. OTHER SYMPTOMS: Do you have any other symptoms? (e.g., dizziness, nausea, vomiting, sweating, fever, difficulty breathing, cough)        denies  11. PREGNANCY: Is there any chance you are pregnant? When was your last menstrual period?        N/a LMP 3 weeks ago     Protocols used: CHEST PAIN-A-AH     BP 145/97, averaging 135-150/95-100  See PCP Within 2 Weeks          Prompt    Disposition/Alternative Protocol     [1] Systolic BP  >= 130 OR Diastolic >= 80 AND [2] taking BP medications   Reason: may need medication adjustment or new medicine; treatment goal usually is under 130/80 for most people; goal may be higher in elderly and based on other factors.      Backcharted due to Epic error, chart was stuck.

## 2021-12-17 NOTE — Unmapped (Signed)
[facility] Pharmacy Refill Coordination Note    Specialty Medication(s) to be Shipped:   Inflammatory Disorders: Humira    Other medication(s) to be shipped: No additional medications requested for fill at this time     Heather Watts, DOB: 02-Sep-1971  Phone: [phone] (home)       All above HIPAA information was verified with patient.     Was a Nurse, learning disability used for this call? No    Completed refill call assessment today to schedule patient's medication shipment from the [facility] [phone]).  All relevant notes have been reviewed.     Specialty medication(s) and dose(s) confirmed: Regimen is correct and unchanged.   Changes to medications: Heather Watts reports no changes at this time.  Changes to insurance: No  New side effects reported not previously addressed with a pharmacist or physician: None reported  Questions for the pharmacist: No    Confirmed patient received a Conservation officer, historic buildings and a Surveyor, mining with first shipment. The patient will receive a drug information handout for each medication shipped and additional FDA Medication Guides as required.       DISEASE/MEDICATION-SPECIFIC INFORMATION        For patients on injectable medications: Patient currently has 1 doses left.  Next injection is scheduled for 12/20/2021.    SPECIALTY MEDICATION ADHERENCE     Medication Adherence    Patient reported X missed doses in the last month: 0  Specialty Medication: Humira  Patient is on additional specialty medications: No  Any gaps in refill history greater than 2 weeks in the last 3 months: no  Demonstrates understanding of importance of adherence: yes  Informant: patient  Reliability of informant: reliable  Confirmed plan for next specialty medication refill: delivery by pharmacy  Refills needed for supportive medications: not needed              Were doses missed due to medication being on hold? No    Humira 40/0.4 mg/ml:  7 days of medicine on hand        REFERRAL TO PHARMACIST     Referral to the pharmacist: Not needed      [facility]     Shipping address confirmed in Epic.     Delivery Scheduled: Yes, Expected medication delivery date: 12/21/2021.     Medication will be delivered via Same Day Courier to the prescription address in Epic WAM.    Heather Watts D Heather Watts   [facility] Shared [facility] Specialty Technician

## 2021-12-21 MED FILL — HUMIRA PEN CITRATE FREE 40 MG/0.4 ML: SUBCUTANEOUS | 28 days supply | Qty: 4 | Fill #8

## 2022-01-12 DIAGNOSIS — L732 Hidradenitis suppurativa: Principal | ICD-10-CM

## 2022-01-14 NOTE — Unmapped (Signed)
[facility] Refill Coordination Note    Specialty Medication(s) to be Shipped:   Inflammatory Disorders: Humira    Other medication(s) to be shipped: No additional medications requested for fill at this time     Heather Watts, DOB: [birth year]  Phone: [phone] (home)       All above HIPAA information was verified with patient.     Was a Nurse, learning disability used for this call? No    Completed refill call assessment today to schedule patient's medication shipment from the [facility] [phone]).  All relevant notes have been reviewed.     Specialty medication(s) and dose(s) confirmed: Regimen is correct and unchanged.   Changes to medications: Heather Watts reports no changes at this time.  Changes to insurance: No  New side effects reported not previously addressed with a pharmacist or physician: None reported  Questions for the pharmacist: No    Confirmed patient received a Conservation officer, historic buildings and a Surveyor, mining with first shipment. The patient will receive a drug information handout for each medication shipped and additional FDA Medication Guides as required.       DISEASE/MEDICATION-SPECIFIC INFORMATION        For patients on injectable medications: Patient currently has 1 doses left.  Next injection is scheduled for 01/17/2022.    SPECIALTY MEDICATION ADHERENCE     Medication Adherence    Patient reported X missed doses in the last month: 0  Specialty Medication: Humira  Patient is on additional specialty medications: No  Any gaps in refill history greater than 2 weeks in the last 3 months: no  Demonstrates understanding of importance of adherence: yes  Informant: patient  Reliability of informant: reliable  Confirmed plan for next specialty medication refill: delivery by pharmacy  Refills needed for supportive medications: not needed              Were doses missed due to medication being on hold? No    Humira 40/0.4 mg/ml:  7 days of medicine on hand        REFERRAL TO PHARMACIST     Referral to the pharmacist: Not needed      [facility]     Shipping address confirmed in Epic.     Delivery Scheduled: Yes, Expected medication delivery date: 01/18/2022.     Medication will be delivered via Same Day Courier to the prescription address in Epic WAM.    Heather Watts   [facility] Specialty Technician

## 2022-01-18 MED FILL — HUMIRA PEN CITRATE FREE 40 MG/0.4 ML: SUBCUTANEOUS | 28 days supply | Qty: 4 | Fill #9

## 2022-01-26 MED ORDER — METOPROLOL SUCCINATE ER 25 MG TABLET,EXTENDED RELEASE 24 HR
ORAL_TABLET | Freq: Every day | ORAL | 0 refills | 30 days | Status: CP
Start: 2022-01-26 — End: 2023-01-27

## 2022-01-26 NOTE — Unmapped (Signed)
Patient is requesting the following refill  Requested Prescriptions     Pending Prescriptions Disp Refills   ??? metoprolol succinate (TOPROL-XL) 25 MG 24 hr tablet [Pharmacy Med Name: METOPROLOL ER SUCCINATE 25MG  TABS] 90 tablet 1     Sig: TAKE 1 TABLET(25 MG) BY MOUTH DAILY       Recent Visits  Date Type Provider Dept   04/30/21 Office Visit Keri Rosita Fire, FNP [facility]   Showing recent visits within past 365 days with a meds authorizing provider and meeting all other requirements  Future Appointments  Date Type Provider Dept   05/02/22 Appointment Keri Rosita Fire, FNP [facility]   Showing future appointments within next 365 days with a meds authorizing provider and meeting all other requirements

## 2022-02-07 NOTE — Unmapped (Signed)
[facility] Pharm[facility] Refill Coordination Note    Specialty Medication(s) to be Shipped:   Inflammatory Disorders: Humira    Other medication(s) to be shipped: No additional medications requested for fill at this time     Heather Watts, DOB: December 12, 1970  Phone: [phone] (home)       All above HIPAA information was verified with patient.     Was a Nurse, learning disability used for this call? No    Completed refill call assessment today to schedule patient's medication shipment from the [facility] [phone]).  All relevant notes have been reviewed.     Specialty medication(s) and dose(s) confirmed: Regimen is correct and unchanged.   Changes to medications: Heather Watts reports no changes at this time.  Changes to insurance: No  New side effects reported not previously addressed with a pharmacist or physician: None reported  Questions for the pharmacist: No    Confirmed patient received a Conservation officer, historic buildings and a Surveyor, mining with first shipment. The patient will receive a drug information handout for each medication shipped and additional FDA Medication Guides as required.       DISEASE/MEDICATION-SPECIFIC INFORMATION        For patients on injectable medications: Patient currently has 2 doses left.  Next injection is scheduled for 02/07/2022.    SPECIALTY MEDICATION ADHERENCE     Medication Adherence    Patient reported X missed doses in the last month: 0  Specialty Medication: Humira  Patient is on additional specialty medications: No  Any gaps in refill history greater than 2 weeks in the last 3 months: no  Demonstrates understanding of importance of adherence: yes  Informant: patient  Reliability of informant: reliable  Confirmed plan for next specialty medication refill: delivery by pharmacy  Refills needed for supportive medications: not needed              Were doses missed due to medication being on hold? No    Humira 40/0.4 mg/ml:  14 days of medicine on hand        REFERRAL TO PHARMACIST     Referral to the pharmacist: Not needed      [facility]     Shipping address confirmed in Epic.     Delivery Scheduled: Yes, Expected medication delivery date: 02/11/2022.     Medication will be delivered via Same Day Courier to the prescription address in Epic WAM.    Heather Watts D Laurie Lovejoy   [facility] Shared [facility] Specialty Technician

## 2022-02-08 DIAGNOSIS — L732 Hidradenitis suppurativa: Principal | ICD-10-CM

## 2022-02-08 MED ORDER — DOXYCYCLINE HYCLATE 100 MG CAPSULE
ORAL_CAPSULE | 0 refills | 0.00000 days
Start: 2022-02-08 — End: ?

## 2022-02-09 MED ORDER — DOXYCYCLINE HYCLATE 100 MG CAPSULE
ORAL_CAPSULE | 0 refills | 0 days | Status: CP
Start: 2022-02-09 — End: ?

## 2022-02-11 NOTE — Unmapped (Signed)
Heather Watts 's Humira shipment will be delayed as a result of the patient's insurance plan being in grace period (patient needs to pay insurance premium).     I have reached out to the patient  at [phone] and communicated the delay. We will wait for a call back from the patient to reschedule the delivery.  We have not confirmed the new delivery date.

## 2022-02-14 NOTE — Unmapped (Unsigned)
Name:  Heather Watts  DOB: [birth year]  Today's Date: 02/14/2022  Age:  51 y.o.    A/P:  Problem List Items Addressed This Visit    None      Medication adherence and barriers to the treatment plan have been addressed. Opportunities to optimize healthy behaviors have been discussed. Patient / caregiver voiced understanding.      No follow-ups on file.    S:  Heather Watts is a 51 y.o. female who presents to establish care. Previous PCP Etheleen Nicks FNP.     Hypertension   Patient with history of hypertension manages with amlodipine 10 mg daily, chlorthalidone 25 mg daily, metoprolol succinate 25 mg daily, also taking Potassium chloride 40 mEq.Marland Kitchen Blood pressure at home ***, at triage today ***. Denies CP, SOB, L/D or peripheral edema.    CPE.  Age appropriate preventative care, health maintenance, and anticipatory guidance discussed/handout provided.  Immunizations:  Influenza vaccine - Advised Pt to get in the fall.  Pneumococcal Conjugate 13-Valent - ***  Pneumococcal Polysaccharide 23 - ***  Pneumococcal Conjugate 20-Valent-  COVID-06/20/20, 11/02/20, due for booster  TdaP - 07/04/18, due 2029  Zoster- due at 50  Screenings:  Pap - last completed on in 2001; pap normal; repeat testing due.  Mammogram - none on file  DEXA scan (women 50 yo or older or if 10-year FRAX risk of MOF of 8.4%)-   AAA/CT Lung (2021 guidelines 50-80, 20+ pack year history within prior 15 years) -   CRC - colonoscopy testing due .  Hep C - 05/04/20, negative.  Ordered labs as below.        PHQ-2 Score:    PHQ-9 Score:    {select_status_or_delete_smartlist:64641}    ROS:  A 12 point review of systems was negative except for pertinent items noted in the HPI.    Past medical history, family history, surgical history and social history personally reviewed and verified with patient.     PROBLEM LIST  Patient Active Problem List   Diagnosis    Essential hypertension    Hyperlipidemia    Grouped skin lesions       O:  There were no vitals filed for this visit.  General appearance: No acute distress, well appearing and well nourished.   HEENT: Normocephalic, atraumatic. Conjunctiva normal, lids w/o swelling, erythema or discharge. TMs gray with intact cone of light. Nasal mucosa, septum, and turbinates w/o edema or erythema. OP normal with no erythema, edema, exudate or lesions.   NECK: Supple, symmetric, trachea midline, no masses. No lymphadenopathy. No thyromegaly or masses.  HEART: RRR w/ normal S1 and S2. No MRG. DP and PT pulses  2+ bilaterally. No peripheral edema or varicosities.  LUNGS: No signs of respiratory distress. CTA all fields. No wheezes, rales, rhonchi or crackles.   ABDOMEN: Non-tender, no masses. Normoactive BS. No hernia appreciated.   MSK: Normal ROM. Strength normal, equal bilaterally.   SKIN: Warm, dry, intact. No suspicious lesions noted on face, trunk, or extremeties.    NEURO: Cranial nerves II-XII intact. Reflexes 2+ and symmetric. No sensory loss.   PSYCH: A&O x 3. Affect normal. Judgment, insight normal. Makes good eye contact. Smiles appropriately. Well-kempt.    I attest that I, Heather Watts, personally documented this note while acting as scribe for [employer], PAC.      Soua Lenk E Shakoya Gilmore, Scribe.  02/28/2022     The documentation recorded by the scribe accurately reflects the service I personally  performed and the decisions made by me.    Hillary A Tester, PAC

## 2022-02-21 NOTE — Unmapped (Signed)
Heather Watts 's Humira shipment will be delayed as a result of the patient is no longer in their grace period.     I have reached out to the patient  at [phone] sent a text and left a voicemail message.  We will wait for a call back from the patient to reschedule the delivery.  We have not confirmed the new delivery date.

## 2022-02-24 NOTE — Unmapped (Signed)
Spoke with patient. Delivery is now scheduled for delivery on 4/21 via same day courier to prescription address. Patient has no questions for the pharmacist.

## 2022-02-25 MED FILL — HUMIRA PEN CITRATE FREE 40 MG/0.4 ML: SUBCUTANEOUS | 28 days supply | Qty: 4 | Fill #10

## 2022-03-01 MED ORDER — POTASSIUM CHLORIDE ER 20 MEQ TABLET,EXTENDED RELEASE(PART/CRYST)
ORAL_TABLET | 2 refills | 0 days
Start: 2022-03-01 — End: ?

## 2022-03-02 MED ORDER — POTASSIUM CHLORIDE ER 20 MEQ TABLET,EXTENDED RELEASE(PART/CRYST)
ORAL_TABLET | 2 refills | 0 days | Status: CP
Start: 2022-03-02 — End: ?

## 2022-03-02 NOTE — Unmapped (Signed)
Refill request received for patient.      Medication Requested: potassium chloride  Last Office Visit: Visit date not found   Next Office Visit: Visit date not found  Last Prescriber: Dr. Luretha Murphy    Nurse refill requirements met? No  If not met, why: pt not seen in clinic for more than 12 months    Sent to: Provider for signing  If sent to provider, which provider?: Dr. Luretha Murphy

## 2022-03-02 NOTE — Unmapped (Signed)
Refill request received for patient.      Medication Requested: potassium chloride  Last Office Visit: Visit date not found   Next Office Visit: Visit date not found  Last Prescriber: Dr. Luretha Murphy    Nurse refill requirements met? No  If not met, why:   Sent to:   If sent to provider, which provider?: Dr. Luretha Murphy

## 2022-03-22 NOTE — Unmapped (Signed)
[facility] Special[facility] Pharmacy Refill Coordination Note    Specialty Medication(s) to be Shipped:   Inflammatory Disorders: Humira    Other medication(s) to be shipped: No additional medications requested for fill at this time     Heather Watts, DOB: July 05, 1971  Phone: [phone] (home)       All above HIPAA information was verified with patient.     Was a Nurse, learning disability used for this call? No    Completed refill call assessment today to schedule patient's medication shipment from the [facility] [phone]).  All relevant notes have been reviewed.     Specialty medication(s) and dose(s) confirmed: Regimen is correct and unchanged.   Changes to medications: Heather Watts reports no changes at this time.  Changes to insurance: No  New side effects reported not previously addressed with a pharmacist or physician: None reported  Questions for the pharmacist: No    Confirmed patient received a Conservation officer, historic buildings and a Surveyor, mining with first shipment. The patient will receive a drug information handout for each medication shipped and additional FDA Medication Guides as required.       DISEASE/MEDICATION-SPECIFIC INFORMATION        For patients on injectable medications: Patient currently has 0 doses left.  Next injection is scheduled for 03/30/22.    SPECIALTY MEDICATION ADHERENCE     Medication Adherence    Patient reported X missed doses in the last month: 0  Specialty Medication: Humira  Patient is on additional specialty medications: No              Were doses missed due to medication being on hold? No      REFERRAL TO PHARMACIST     Referral to the pharmacist: Not needed      [facility]     Shipping address confirmed in Epic.     Delivery Scheduled: Yes, Expected medication delivery date: 03/25/22.     Medication will be delivered via Same Day Courier to the prescription address in Epic WAM.    Heather Watts   [facility] Shared [facility] Specialty Technician

## 2022-03-25 DIAGNOSIS — L732 Hidradenitis suppurativa: Principal | ICD-10-CM

## 2022-03-25 MED ORDER — HUMIRA PEN CITRATE FREE 40 MG/0.4 ML
SUBCUTANEOUS | 11 refills | 28 days
Start: 2022-03-25 — End: ?

## 2022-03-25 MED FILL — HUMIRA PEN CITRATE FREE 40 MG/0.4 ML: SUBCUTANEOUS | 28 days supply | Qty: 4 | Fill #11

## 2022-03-28 MED ORDER — HUMIRA PEN CITRATE FREE 40 MG/0.4 ML
SUBCUTANEOUS | 1 refills | 28.00000 days | Status: CP
Start: 2022-03-28 — End: ?
  Filled 2022-04-22: qty 4, 28d supply, fill #0

## 2022-03-28 NOTE — Unmapped (Signed)
Refill request for Humira    LOV: 05/11/2021    No upcoming visits scheduled.     60 day supply pended.    Call center,  Please reach out to patient to have them schedule a visit for any further refills

## 2022-03-28 NOTE — Unmapped (Signed)
Called, no answer  Sent MyChart message    Thanks,  Toni Amend

## 2022-04-19 NOTE — Unmapped (Signed)
Heather Watts reports she's had some more HS flares lately - overall she estimates she's 50% improved from baseline.     I encouraged her to make a follow up with clinic.     [facility] Clinical Assessment & Refill Coordination Note    Heather Watts, DOB: Mar 24, 1971  Phone: [phone] (home)     All above HIPAA information was verified with patient.     Was a Nurse, learning disability used for this call? No    Specialty Medication(s):   Inflammatory Disorders: Humira     Current Outpatient Medications   Medication Sig Dispense Refill    amLODIPine (NORVASC) 10 MG tablet Take 1 tablet (10 mg total) by mouth daily. 90 tablet 1    chlorthalidone (HYGROTON) 25 MG tablet Take 1 tablet (25 mg total) by mouth daily. TAKE 1 TABLET(25 MG) BY MOUTH DAILY 90 tablet 1    cyclobenzaprine (FLEXERIL) 5 MG tablet Take 1-2 tabs po tid prn 60 tablet 1    doxycycline (VIBRAMYCIN) 100 MG capsule Take one capsule by mouth twice daily. 60 capsule 0    empty container Misc Use as directed to dispose of Humira pens. 1 each 2    empty container Misc Use as directed 1 each 2    fluticasone propionate (FLONASE) 50 mcg/actuation nasal spray 1 spray into each nostril daily. 16 g 0    HUMIRA PEN CITRATE FREE 40 MG/0.4 ML Inject the contents of 1 pen (40 mg total) under the skin every seven (7) days. 4 each 1    methylPREDNISolone (MEDROL DOSEPACK) 4 mg tablet follow package directions (Patient not taking: Reported on 12/16/2021) 1 each 0    metoprolol succinate (TOPROL-XL) 25 MG 24 hr tablet Take 1 tablet (25 mg total) by mouth daily. TAKE 1 TABLET(25 MG) BY MOUTH DAILY 30 tablet 0    omeprazole (PRILOSEC) 40 MG capsule Take 1 capsule (40 mg total) by mouth daily. 90 capsule 1    potassium chloride 20 MEQ ER tablet TAKE 2 TABLETS(40 MEQ) BY MOUTH DAILY 120 tablet 2     No current facility-administered medications for this visit.        Changes to medications: [name] reports no changes at this time.    No Known Allergies    Changes to allergies: No    SPECIALTY MEDICATION ADHERENCE     Humira -  1 left    Specialty medication(s) dose(s) confirmed: Regimen is correct and unchanged.     Are there any concerns with adherence? No    Adherence counseling provided? Not needed    CLINICAL MANAGEMENT AND INTERVENTION      Clinical Benefit Assessment:    Do you feel the medicine is effective or helping your condition? Yes    Clinical Benefit counseling provided? Not needed    Adverse Effects Assessment:    Are you experiencing any side effects? No    Are you experiencing difficulty administering your medicine? No    Quality of Life Assessment:    Quality of Life    Rheumatology  Oncology  Dermatology  Cystic Fibrosis          Have you discussed this with your provider? Not needed    Acute Infection Status:    Acute infections noted within Epic:  No active infections  Patient reported infection: None    Therapy Appropriateness:    Is therapy appropriate and patient progressing towards therapeutic goals? Yes, therapy is appropriate and should be continued  DISEASE/MEDICATION-SPECIFIC INFORMATION      For patients on injectable medications: Patient currently has 1 doses left.  Next injection is scheduled for Sat, 6/17.    PATIENT SPECIFIC NEEDS     Does the patient have any physical, cognitive, or cultural barriers? No    Is the patient high risk? No    Does the patient require a Care Management Plan? No       SHIPPING     Specialty Medication(s) to be Shipped:   Inflammatory Disorders: Humira    Other medication(s) to be shipped: No additional medications requested for fill at this time     Changes to insurance: No    Delivery Scheduled: Yes, Expected medication delivery date: Friday, 6/16.     Medication will be delivered via Same Day Courier to the confirmed prescription address in [facility].    The patient will receive a drug information handout for each medication shipped and additional FDA Medication Guides as required.  Verified that patient has previously received a Conservation officer, historic buildings and a Surveyor, mining.    The patient or caregiver noted above participated in the development of this care plan and knows that they can request review of or adjustments to the care plan at any time.      All of the patient's questions and concerns have been addressed.    [name]   [facility] Shared [facility] Specialty Pharmacist

## 2022-04-28 NOTE — Unmapped (Unsigned)
Assessment/Plan:    {No diagnosis found. (Refresh or delete this SmartLink)}  51 yo female presented for her physical exam. All discussions and findings noted below. Pertinent labs drawn and will contact patient with results and any recommendations.     Barriers to goals identified and addressed. Pertinent handouts were given today and reviewed with the patient as indicated.  The Care Plan and Self-Management goals have been included on the AVS and the AVS has been printed.  I encouraged the patient to keep regular logs for me to review at their next visit. Any outside resources or referrals needed at this time are noted above. Patient's current medications have been reviewed. Any new medications prescribed have been discussed, and side effects have been addressed.  Have assessed the patient's understanding, response, and barriers to adherence to medications. Patient voiced understanding and all questions have been answered to satisfaction.       No follow-ups on file.  Subjective:   Heather Watts is a 51 y.o. female who is being seen for her health maintenance exam. Her last pap smear was Pap Smear date: 09/06/2000. No LMP recorded. No chief complaint on file.    Patient reports ***    Lifestyle:  Employment:  Exercise:  Diet:  Caffeine use:   Last Dental:  Last Vision:    ROS:     General: No fatigue, unexpected weight loss or gain, overall feels well***  ENT: Denies visual problems ear symptoms, throat symptoms, nasal congestion***  Cardiovascular: Denies chest pain, palpitations, tachycardia, lower extremity swelling, claudication***  Respiratory: Denies dyspnea, dyspnea on exertion, wheezing, cough***  Gastrointestinal: Denies nausea, vomiting, dyspepsia, dysphagia, chronic constipation, diarrhea, melana, hematochezia***  GU: No abnormal vaginal discharge, pelvic pain, irregular menses, vasomotor symptoms***  Musculoskeletal: Denies joint pains, muscle pain or weakness***  Integumentary: Denies rashes, or other skin lesions***  Neurological: Denies headaches, dizziness, numbness, tingling, syncope***  Psych: Denies symptoms suggesting anxiety, depression, sleep disturbance***    Social History:     Social History     Tobacco Use    Smoking status: Former     Packs/day: 1.00     Years: 14.00     Pack years: 14.00     Types: Cigarettes     Quit date: 11/08/2003     Years since quitting: 18.4    Smokeless tobacco: Never    Tobacco comments:     Quit smoking 2005   Vaping Use    Vaping Use: Never used   Substance Use Topics    Alcohol use: Not Currently    Drug use: Never     Past Medical/Surgical History:     Past Medical History:   Diagnosis Date    COVID-19 11/06/2020    GERD (gastroesophageal reflux disease)     Hyperlipidemia     Hypertension      No past surgical history on file.  Family History:     Family History   Problem Relation Age of Onset    Cancer Mother     Hypertension Mother         Everyone on mother's side of family    COPD Father     Hyperlipidemia Father     Hypertension Father     Cancer Maternal Aunt     Cancer Maternal Uncle     Early death Maternal Uncle         Heart Attack    Aneurysm Maternal Uncle     Stroke Maternal Grandmother  Melanoma Neg Hx     Basal cell carcinoma Neg Hx     Squamous cell carcinoma Neg Hx      Allergies:     Patient has no known allergies.  Current Medications:     Current Outpatient Medications   Medication Sig Dispense Refill    amLODIPine (NORVASC) 10 MG tablet Take 1 tablet (10 mg total) by mouth daily. 90 tablet 1    chlorthalidone (HYGROTON) 25 MG tablet Take 1 tablet (25 mg total) by mouth daily. TAKE 1 TABLET(25 MG) BY MOUTH DAILY 90 tablet 1    cyclobenzaprine (FLEXERIL) 5 MG tablet Take 1-2 tabs po tid prn 60 tablet 1    doxycycline (VIBRAMYCIN) 100 MG capsule Take one capsule by mouth twice daily. 60 capsule 0    empty container Misc Use as directed to dispose of Humira pens. 1 each 2    empty container Misc Use as directed 1 each 2    fluticasone propionate (FLONASE) 50 mcg/actuation nasal spray 1 spray into each nostril daily. 16 g 0    HUMIRA PEN CITRATE FREE 40 MG/0.4 ML Inject the contents of 1 pen (40 mg total) under the skin every seven (7) days. 4 each 1    methylPREDNISolone (MEDROL DOSEPACK) 4 mg tablet follow package directions (Patient not taking: Reported on 12/16/2021) 1 each 0    metoprolol succinate (TOPROL-XL) 25 MG 24 hr tablet Take 1 tablet (25 mg total) by mouth daily. TAKE 1 TABLET(25 MG) BY MOUTH DAILY 30 tablet 0    omeprazole (PRILOSEC) 40 MG capsule Take 1 capsule (40 mg total) by mouth daily. 90 capsule 1    potassium chloride 20 MEQ ER tablet TAKE 2 TABLETS(40 MEQ) BY MOUTH DAILY 120 tablet 2     No current facility-administered medications for this visit.     Health Maintenance:     Health Maintenance   Topic Date Due    Pneumococcal Vaccine 0-64 (1 - PCV) Never done    Pap Smear (21-65)  09/07/2003    Colon Cancer Screening  Never done    COVID-19 Vaccine (3 - Pfizer risk series) 11/30/2020    Serum Creatinine Monitoring  12/16/2022    Potassium Monitoring  12/16/2022    Lipid Screening  09/24/2026    DTaP/Tdap/Td Vaccines (4 - Td or Tdap) 07/04/2028    Hepatitis C Screen  Completed    Influenza Vaccine  Completed     Immunizations:     Immunization History   Administered Date(s) Administered    COVID-19 VACC,MRNA,(PFIZER)(PF) 06/20/2020, 11/02/2020    HEPATITIS B VACCINE ADULT,IM(ENERGIX B, RECOMBIVAX) 07/04/2018, 08/01/2018, 12/06/2018    INFLUENZA TIV (TRI) PF (IM) 12/09/2009    Influenza Vaccine Quad (IIV4 PF) 22mo+ injectable 08/15/2018, 08/13/2019    Influenza Vaccine Quad (IIV4 W/PRESERV) 25MO+ 08/27/2021    Influenza Virus Vaccine, unspecified formulation 08/15/2018, 08/13/2019, 08/22/2020    MMR 05/26/2008, 07/11/2008    TD, ADSORBED, 5LF TETANUS TOXOID, PF(TENIVAC/DECAVAC) 07/11/2008, 07/04/2018    TdaP 05/14/2008     I have reviewed and (if needed) updated the patient's problem list, medications, allergies, past medical and surgical history, preventive services, and social and family history.  Vital Signs:     Wt Readings from Last 3 Encounters:   12/16/21 (!) 104.3 kg (230 lb)   10/02/21 (!) 106.6 kg (235 lb)   04/30/21 (!) 108.9 kg (240 lb)     Temp Readings from Last 3 Encounters:   12/16/21 36.2 ??C (97.2 ??F) (Skin)  10/02/21 37.5 ??C (99.5 ??F) (Temporal)   04/30/21 36.7 ??C (98 ??F) (Oral)     BP Readings from Last 3 Encounters:   12/16/21 140/93   10/02/21 137/84   04/30/21 120/80     Pulse Readings from Last 3 Encounters:   12/16/21 92   10/02/21 97   04/30/21 94     Estimated body mass index is 34.97 kg/m?? as calculated from the following:    Height as of 12/16/21: 172.7 cm (5' 8).    Weight as of 12/16/21: 104.3 kg (230 lb).  No height and weight on file for this encounter.    Objective:     General Appearance: Alert, cooperative, no distress, appears stated age***  Head and Neck: Normocephalic, atraumatic. No abnormal masses. No thyromegaly. No bruits.***  EENT: Eyes: PERRLA, Conjunctiva clear Ears: bilateral TMs normal Nose: septum and turbinates unremarkable Throat: mucus membranes moist. No tonsillar enlargement or exudates. No OP erythema.***  Cardiovascular: Regular rate and rhythm. Normal S1, S2. No murmur, rubs or gallops.***  Respiratory: Lungs clear to auscultation bilaterally,  Respiratory effort unremarkable. No wheezes or rhonchi.***  Breast: Symmetrical. No masses or lumps. No nipple discharge.  No abnormal axillary nodes.***  Genitourinary: Normal female external genitalia. Urethra non-tender and without masses, lesions. Vagina normal in appearance, without discharge, lesions. No significant prolapse. Cervix without stenosis, CMT. Uterus mobile, normal in size and non-tender. ***  Gastrointestinal: Abdomen soft and non-tender. +BS, non-distended. No hernia,organomegaly or masses.***  MSK: Gait and station unremarkable. Normal ROM major joints. Normal strength and tone of proximal muscles.***  Extremities: No edema. Normal peripheral pulses bilaterally.***  Integumentary: Warm and dry. No rashes.***  Lymph Nodes: Cervical, supraclavicular, and axillary nodes normal.***  Neurologic: Alert and oriented to place and time. Normal deep tendon reflexes. Normal sensation. CN II-XII grossly intact.***  Psychiatric: Mood and affect appropriate for situation.***    Labs:   No results found for this visit on 05/11/22.      I attest that I, Vergia Alberts, personally documented this note while acting as scribe for Noralyn Pick, FNP.      Vergia Alberts, Scribe.  05/11/2022     The documentation recorded by the scribe accurately reflects the service I personally performed and the decisions made by me.    Noralyn Pick, FNP

## 2022-05-17 MED ORDER — VALACYCLOVIR 500 MG TABLET
ORAL_TABLET | Freq: Two times a day (BID) | ORAL | 0 refills | 15 days | Status: CP
Start: 2022-05-17 — End: 2023-05-17

## 2022-05-17 NOTE — Unmapped (Signed)
[facility] Refill Coordination Note    Specialty Medication(s) to be Shipped:   Inflammatory Disorders: Humira    Other medication(s) to be shipped: No additional medications requested for fill at this time     Heather Watts, DOB: 1971/07/30  Phone: [phone] (home)       All above HIPAA information was verified with patient.     Was a Nurse, learning disability used for this call? No    Completed refill call assessment today to schedule patient's medication shipment from the [facility] [phone]).  All relevant notes have been reviewed.     Specialty medication(s) and dose(s) confirmed: Regimen is correct and unchanged.   Changes to medications: Heather Watts reports no changes at this time.  Changes to insurance: No  New side effects reported not previously addressed with a pharmacist or physician: None reported  Questions for the pharmacist: No    Confirmed patient received a Conservation officer, historic buildings and a Surveyor, mining with first shipment. The patient will receive a drug information handout for each medication shipped and additional FDA Medication Guides as required.       DISEASE/MEDICATION-SPECIFIC INFORMATION        For patients on injectable medications: Patient currently has 1 doses left.  Next injection is scheduled for 05/21/2022.    SPECIALTY MEDICATION ADHERENCE     Medication Adherence    Patient reported X missed doses in the last month: 0  Specialty Medication: Humira  Patient is on additional specialty medications: No  Any gaps in refill history greater than 2 weeks in the last 3 months: no  Demonstrates understanding of importance of adherence: yes  Informant: patient  Reliability of informant: reliable  Confirmed plan for next specialty medication refill: delivery by pharmacy  Refills needed for supportive medications: not needed              Were doses missed due to medication being on hold? No      REFERRAL TO PHARMACIST     Referral to the pharmacist: Not needed      [facility] [address]     Shipping address confirmed in Epic.     Delivery Scheduled: Yes, Expected medication delivery date: 05/20/2022.     Medication will be delivered via Same Day Courier to the prescription address in Epic WAM.    Heather Watts D Heather Watts   [facility] Specialty Technician

## 2022-05-17 NOTE — Unmapped (Signed)
P/C requesting refill on Valacyclovir , last ordered on 05/18/20, last visit 04/30/21. Next visit scheduled 07/20/22

## 2022-05-20 DIAGNOSIS — L732 Hidradenitis suppurativa: Principal | ICD-10-CM

## 2022-05-20 MED ORDER — HUMIRA PEN CITRATE FREE 40 MG/0.4 ML
SUBCUTANEOUS | 0 refills | 28.00000 days | Status: CP
Start: 2022-05-20 — End: ?
  Filled 2022-05-20: qty 4, 28d supply, fill #1
  Filled 2022-06-21: qty 4, 28d supply, fill #0

## 2022-05-20 NOTE — Unmapped (Signed)
Refill request for Humira 40mg . LOV: 05/11/21 with upcoming visit on 06/07/22. Refills pended to bridge to next appointment.

## 2022-05-29 DIAGNOSIS — I10 Essential (primary) hypertension: Principal | ICD-10-CM

## 2022-05-29 MED ORDER — CHLORTHALIDONE 25 MG TABLET
ORAL_TABLET | 1 refills | 0 days
Start: 2022-05-29 — End: ?

## 2022-05-29 MED ORDER — AMLODIPINE 10 MG TABLET
ORAL_TABLET | 1 refills | 0 days
Start: 2022-05-29 — End: ?

## 2022-05-31 ENCOUNTER — Other Ambulatory Visit: Payer: Self-pay

## 2022-05-31 ENCOUNTER — Emergency Department
Admission: EM | Admit: 2022-05-31 | Discharge: 2022-05-31 | Disposition: A | Discharge status: Home / Self Care | Payer: BLUE CROSS/BLUE SHIELD | Attending: Emergency Medicine | Admitting: Emergency Medicine

## 2022-05-31 ENCOUNTER — Emergency Department: Payer: BLUE CROSS/BLUE SHIELD

## 2022-05-31 DIAGNOSIS — L732 Hidradenitis suppurativa: Secondary | ICD-10-CM | POA: Diagnosis not present

## 2022-05-31 DIAGNOSIS — E876 Hypokalemia: Secondary | ICD-10-CM | POA: Insufficient documentation

## 2022-05-31 DIAGNOSIS — I10 Essential (primary) hypertension: Secondary | ICD-10-CM | POA: Insufficient documentation

## 2022-05-31 DIAGNOSIS — R079 Chest pain, unspecified: Secondary | ICD-10-CM

## 2022-05-31 LAB — CBC
HCT: 40 % (ref 36.0–46.0)
Hemoglobin: 13.2 g/dL (ref 12.0–15.0)
MCH: 26.8 pg (ref 26.0–34.0)
MCHC: 33 g/dL (ref 30.0–36.0)
MCV: 81.3 fL (ref 80.0–100.0)
Platelets: 368 10*3/uL (ref 150–400)
RBC: 4.92 MIL/uL (ref 3.87–5.11)
RDW: 14.1 % (ref 11.5–15.5)
WBC: 8.1 10*3/uL (ref 4.0–10.5)
nRBC: 0 % (ref 0.0–0.2)

## 2022-05-31 LAB — BASIC METABOLIC PANEL
Anion gap: 9 (ref 5–15)
BUN: 15 mg/dL (ref 6–20)
CO2: 26 mmol/L (ref 22–32)
Calcium: 9.8 mg/dL (ref 8.9–10.3)
Chloride: 102 mmol/L (ref 98–111)
Creatinine, Ser: 0.72 mg/dL (ref 0.44–1.00)
GFR, Estimated: >60 mL/min (ref >60)
Glucose, Bld: 95 mg/dL (ref 70–99)
Potassium: 2.8 mmol/L — ABNORMAL LOW (ref 3.5–5.1)
Sodium: 137 mmol/L (ref 135–145)

## 2022-05-31 LAB — TROPONIN I (HIGH SENSITIVITY): Troponin I (High Sensitivity): 4 ng/L (ref <18)

## 2022-05-31 LAB — MAGNESIUM: Magnesium: 2 mg/dL (ref 1.7–2.4)

## 2022-05-31 MED ORDER — DOXYCYCLINE MONOHYDRATE 100 MG PO TABS: 100.0000 mg | ORAL_TABLET | Freq: Two times a day (BID) | ORAL | 0 refills | Status: AC

## 2022-05-31 MED ORDER — POTASSIUM CHLORIDE CRYS ER 20 MEQ PO TBCR
40.0000 meq | EXTENDED_RELEASE_TABLET | Freq: Once | ORAL | Status: AC
  Administered 2022-05-31: 40 meq via ORAL
  Filled 2022-05-31: qty 2

## 2022-05-31 MED ORDER — AMLODIPINE 10 MG TABLET
ORAL_TABLET | Freq: Every day | ORAL | 0 refills | 30 days
Start: 2022-05-31 — End: 2023-05-31

## 2022-05-31 MED ORDER — CHLORTHALIDONE 25 MG TABLET
ORAL_TABLET | Freq: Every day | ORAL | 0 refills | 30 days
Start: 2022-05-31 — End: 2023-05-31

## 2022-05-31 NOTE — Unmapped (Signed)
Patient called with c/o chest pain. Patient has hx of GERD, but she is worried it could be more. I told her that she could start here with an EKG, but we would likely refer her to the ED for have a cardiac workup done. Patient verbalized understanding and preferred to go on to the ED.

## 2022-05-31 NOTE — Unmapped (Signed)
Patient is requesting the following refill  Requested Prescriptions     Pending Prescriptions Disp Refills    amLODIPine (NORVASC) 10 MG tablet [Pharmacy Med Name: AMLODIPINE BESYLATE 10MG  TABLETS] 90 tablet 1     Sig: TAKE 1 TABLET(10 MG) BY MOUTH DAILY    chlorthalidone (HYGROTON) 25 MG tablet [Pharmacy Med Name: CHLORTHALIDONE 25MG  TABLETS] 90 tablet 1     Sig: TAKE 1 TABLET(25 MG) BY MOUTH DAILY       Recent Visits  No visits were found meeting these conditions.  Showing recent visits within past 365 days with a meds authorizing provider and meeting all other requirements  Future Appointments  Date Type Provider Dept   07/20/22 Appointment Keri Rosita Fire, FNP [facility] [address] [facility]   Showing future appointments within next 365 days with a meds authorizing provider and meeting all other requirements       Labs: Creatinine:   Creatinine (mg/dL)   Date Value   29/56/2130 0.76   03/03/2021 0.89    Potassium:   Potassium (mmol/L)   Date Value   12/16/2021 3.7   03/03/2021 3.5    Sodium:   Sodium (mmol/L)   Date Value   12/16/2021 142   03/03/2021 142    Vitals:   BP Readings from Last 3 Encounters:   12/16/21 140/93   10/02/21 137/84   04/30/21 120/80    and   Pulse Readings from Last 3 Encounters:   12/16/21 92   10/02/21 97   04/30/21 94    Last appt 04/30/2021.

## 2022-05-31 NOTE — ED Provider Notes (Signed)
[facility] Provider Note    Event Date/Time   First MD Initiated Contact with Patient 05/31/22 1420     (approximate)   History   Chest Pain   HPI  Alice Stephens is a 51 y.o. female with hypertension, adenitis preparative, concern for possible pericardial effusion who now comes in with concerns for chest pain.  Patient reports having 2 days of some tingling sensation and pain in her upper chest.  She denies any shortness of breath, pain with breathing, swelling in her legs, history of blood clots, estrogen use or other concerns.  She does report that a day prior to this starting she did develop some of her-itis flareup underneath her left arm and she is wondering if the pain can be related to that  I reviewed the records and was seen at [facility] emergency room and there was concern for a small pericardial effusion on the POCUS exam but when patient followed up with the the formal echocardiogram with cardiology there was no evidence of pericardial effusion on echocardiogram 02/16/2021 Physical Exam   Triage Vital Signs: ED Triage Vitals  Enc Vitals Group     BP 05/31/22 1142 (!) 145/102     Pulse Rate 05/31/22 1142 98     Resp 05/31/22 1142 18     Temp 05/31/22 1142 98.3 F (36.8 C)     Temp Source 05/31/22 1142 Oral     SpO2 05/31/22 1142 99 %     Weight 05/31/22 1145 210 lb 1.6 oz (95.3 kg)     Height 05/31/22 1145 5\' 8"  (1.727 m)     Head Circumference --      Peak Flow --      Pain Score 05/31/22 1145 3     Pain Loc --      Pain Edu? --      Excl. in GC? --     Most recent vital signs: Vitals:   05/31/22 1142  BP: (!) 145/102  Pulse: 98  Resp: 18  Temp: 98.3 F (36.8 C)  SpO2: 99%     General: Awake, no distress.  CV:  Good peripheral perfusion.  Resp:  Normal effort.  Abd:  No distention.  No abdominal tenderness Other:  Patient has some indurated abscesses noted underneath the left arm.  She has no swelling in her legs.  No  calf tenderness   ED Results / Procedures / Treatments   Labs (all labs ordered are listed, but only abnormal results are displayed) Labs Reviewed  BASIC METABOLIC PANEL - Abnormal; Notable for the following components:      Result Value   Potassium 2.8 (*)    All other components within normal limits  CBC  POC URINE PREG, ED  TROPONIN I (HIGH SENSITIVITY)  TROPONIN I (HIGH SENSITIVITY)     EKG  My interpretation of EKG:  Normal sinus rhythm 96 without any ST elevation, T wave version in lead III, normal intervals  Reviewed a prior EKG where she had similar T wave inversion  RADIOLOGY I have reviewed the xray personally and interpreted and mild cardiac enlargement  PROCEDURES:  Critical Care performed: No  Procedures   MEDICATIONS ORDERED IN ED: Medications - No data to display   IMPRESSION / MDM / ASSESSMENT AND PLAN / ED COURSE  I reviewed the triage vital signs and the nursing notes.   Patient's presentation is most consistent with acute presentation with potential threat to life or bodily function.  Differential includes ACS, PERC negative unlikely PE, pneumonia, electrolyte abnormalities  BMP shows low potassium of 2.8 we will give some IV oral repletion.  CBC troponin negative.  Mag normal  We discussed IV repletion but patient had a really hard time getting IV and reports that she needs to leave to go pick up her daughter.  She reports having chronically low potassium in the past and that she recently went down on her home potassium at home.  She denies needing a prescription for more potassium at home but will increase her potassium back to twice a day and have it rechecked with her primary care doctor next week.  I suspect the chest pain was related to the hidradenitis.  I did a bedside ultrasound did not see any large effusion.  I can place a cardiology referral as needed.  Patient's pain has been going on for greater than 3 hours therefore it hold  off on repeat troponin.  Patient feels comfortable with discharge home and requesting at this time.  I did recommend that she needs to get a pregnancy test before leaving but she reports not being sexually active in 2 years and declines pregnancy test  The patient is on the cardiac monitor to evaluate for evidence of arrhythmia and/or significant heart rate changes.      FINAL CLINICAL IMPRESSION(S) / ED DIAGNOSES   Final diagnoses:  Hypokalemia  Hidradenitis suppurativa     Rx / DC Orders   ED Discharge Orders          Ordered    doxycycline (ADOXA) 100 MG tablet  2 times daily        05/31/22 1445             Note:  This document was prepared using Dragon voice recognition software and may include unintentional dictation errors.   Concha Se, MD 05/31/22 [phone]

## 2022-05-31 NOTE — ED Notes (Signed)
Pt to ED for L sided CP that started yesterday, Pt described as a sore/tender pain that is intermittent lasting for a couple of seconds. Denies SOB, abdominal pain. Pt has hx of pericardial effusions but denies any other hx.  Pt is a&ox4.

## 2022-05-31 NOTE — Discharge Instructions (Signed)
Your potassium was very low and you should restart your potassium pills at home and have your primary care doctor follow-up with a recheck next week.  Your chest pain could be from the high tinnitus and we have started you on some treatment for that but you need to follow-up with your dermatologist and I placed a referral for cardiology if continue to have chest discomfort  Return to the ER for fevers, worsening pain or any other concerns

## 2022-05-31 NOTE — ED Triage Notes (Signed)
Pt here with CP that started a few days ago and does not radiate. Pt states pain is tender and is intermittent. Pt ambulatory to triage.

## 2022-06-01 ENCOUNTER — Ambulatory Visit
Admit: 2022-06-01 | Discharge: 2022-06-01 | Disposition: A | Discharge status: Home / Self Care | Payer: PRIVATE HEALTH INSURANCE

## 2022-06-01 DIAGNOSIS — E876 Hypokalemia: Principal | ICD-10-CM

## 2022-06-01 LAB — CBC W/ AUTO DIFF
BASOPHILS ABSOLUTE COUNT: 0.1 10*9/L (ref 0.0–0.1)
BASOPHILS RELATIVE PERCENT: 1 %
EOSINOPHILS ABSOLUTE COUNT: 0.1 10*9/L (ref 0.0–0.5)
EOSINOPHILS RELATIVE PERCENT: 2 %
HEMATOCRIT: 39.9 % (ref 34.0–44.0)
HEMOGLOBIN: 13.4 g/dL (ref 11.3–14.9)
LYMPHOCYTES ABSOLUTE COUNT: 2.5 10*9/L (ref 1.1–3.6)
LYMPHOCYTES RELATIVE PERCENT: 33.2 %
MEAN CORPUSCULAR HEMOGLOBIN CONC: 33.6 g/dL (ref 32.0–36.0)
MEAN CORPUSCULAR HEMOGLOBIN: 27 pg (ref 25.9–32.4)
MEAN CORPUSCULAR VOLUME: 80.2 fL (ref 77.6–95.7)
MEAN PLATELET VOLUME: 6.9 fL (ref 6.8–10.7)
MONOCYTES ABSOLUTE COUNT: 0.7 10*9/L (ref 0.3–0.8)
MONOCYTES RELATIVE PERCENT: 9.1 %
NEUTROPHILS ABSOLUTE COUNT: 4.2 10*9/L (ref 1.8–7.8)
NEUTROPHILS RELATIVE PERCENT: 54.7 %
NUCLEATED RED BLOOD CELLS: 0 /100{WBCs} (ref <=4)
PLATELET COUNT: 327 10*9/L (ref 150–450)
RED BLOOD CELL COUNT: 4.97 10*12/L (ref 3.95–5.13)
RED CELL DISTRIBUTION WIDTH: 15 % (ref 12.2–15.2)
WBC ADJUSTED: 7.6 10*9/L (ref 3.6–11.2)

## 2022-06-01 LAB — BASIC METABOLIC PANEL
ANION GAP: 12 mmol/L (ref 5–14)
BLOOD UREA NITROGEN: 8 mg/dL — ABNORMAL LOW (ref 9–23)
BUN / CREAT RATIO: 11
CALCIUM: 10.4 mg/dL (ref 8.7–10.4)
CHLORIDE: 103 mmol/L (ref 98–107)
CO2: 22 mmol/L (ref 20.0–31.0)
CREATININE: 0.7 mg/dL
EGFR CKD-EPI (2021) FEMALE: >90 mL/min/{1.73_m2} (ref >=60)
GLUCOSE RANDOM: 114 mg/dL (ref 70–179)
POTASSIUM: 3.2 mmol/L — ABNORMAL LOW (ref 3.4–4.8)
SODIUM: 137 mmol/L (ref 135–145)

## 2022-06-01 LAB — TSH: THYROID STIMULATING HORMONE: 0.462 u[IU]/mL — ABNORMAL LOW (ref 0.550–4.780)

## 2022-06-01 LAB — T4, FREE: FREE T4: 1.71 ng/dL (ref 0.89–1.76)

## 2022-06-01 LAB — T3, FREE: T3 FREE: 3.01 pg/mL (ref 2.30–4.20)

## 2022-06-01 LAB — HIGH SENSITIVITY TROPONIN I - SINGLE: HIGH SENSITIVITY TROPONIN I: 3 ng/L (ref <=34)

## 2022-06-01 MED ORDER — MAGNESIUM OXIDE 400 MG (241.3 MG MAGNESIUM) TABLET
ORAL_TABLET | Freq: Every day | ORAL | 0 refills | 30 days | Status: CP
Start: 2022-06-01 — End: 2022-07-01

## 2022-06-01 MED ADMIN — potassium chloride (KLOR-CON) packet 40 mEq: 40 meq | ORAL | @ 14:00:00 | Stop: 2022-06-01

## 2022-06-01 NOTE — Unmapped (Signed)
Discharge papers instructed, verbalized understanding.

## 2022-06-01 NOTE — Unmapped (Addendum)
Potassium was 2.8 at [facility] ER yesterday and today it was 3.2 today at [facility].  Was told to hold chlorthalidone, take potassium and magnesium supplementation.  She is wanting additional BP meds.  Gwenette aware she needs to be seen per ER note.  Appt 06/06/22 at 9:40.  Keri aware.

## 2022-06-01 NOTE — Unmapped (Signed)
Seen at outside ER yesterday and diagnosed with hypokalemia. Given 2 pills yesterday and 2 this AM. Now stating feels jittery, dizzy, intermittent chest discomfort. Left OSH after declining IV potassium.

## 2022-06-02 MED ORDER — CHLORTHALIDONE 25 MG TABLET
ORAL_TABLET | Freq: Every day | ORAL | 0 refills | 30 days | Status: CP
Start: 2022-06-02 — End: 2023-06-02

## 2022-06-02 MED ORDER — AMLODIPINE 10 MG TABLET
ORAL_TABLET | Freq: Every day | ORAL | 0 refills | 30 days | Status: CP
Start: 2022-06-02 — End: 2023-06-02

## 2022-06-02 NOTE — Unmapped (Signed)
Upcoming Appt:  Future Appointments   Date Time Provider Department Center   06/06/2022  9:40 AM Keri Rosita Fire, FNP [facility]   06/07/2022 10:30 AM Abbie Sons, MD [facility]   07/20/2022  1:00 PM Keri Rosita Fire, FNP [facility]   09/26/2022  8:40 AM Hillary Aspen Tester, PAC [id number]       Recent:   What is the date of your last related visit?  ED today 07/26/23for hypokalemia- received potassium in ED  Related acute medications Rx'd:  none  Home treatment tried:  none      Relevant:   Allergies: Patient has no known allergies.  Medications:  Potassium  Health History: hypokalemia  Weight: n/a      [place] [facility] patients only:  What was the date of your last cancer treatment (mm/dd/yy)?: n/a  Was the treatment oral or infusion?: n/a  Are you currently on TVEC (yes/no)?: n/a  Reason for Disposition   [1] Follow-up call from patient regarding patient's clinical status AND [2] information NON-URGENT    Answer Assessment - Initial Assessment Questions  1. REASON FOR CALL or QUESTION: What is your reason for calling today? or How can I best  help you? or What question do you have that I can help answer?      How long does it take for her to rebound from low potassium  2. CALLER: Document the source of call. (e.g., laboratory, patient).      patient    Protocols used: PCP Call - No Triage-A-AH

## 2022-06-06 ENCOUNTER — Ambulatory Visit: Admit: 2022-06-06 | Discharge: 2022-06-07 | Payer: PRIVATE HEALTH INSURANCE | Attending: Family | Primary: Family

## 2022-06-06 DIAGNOSIS — R002 Palpitations: Principal | ICD-10-CM

## 2022-06-06 DIAGNOSIS — E875 Hyperkalemia: Principal | ICD-10-CM

## 2022-06-06 DIAGNOSIS — I10 Essential (primary) hypertension: Principal | ICD-10-CM

## 2022-06-06 LAB — POTASSIUM: POTASSIUM: 4 mmol/L (ref 3.4–4.8)

## 2022-06-06 LAB — PARATHYROID HORMONE (PTH): PARATHYROID HORMONE INTACT: 124.1 pg/mL — ABNORMAL HIGH (ref 18.4–80.1)

## 2022-06-06 NOTE — Unmapped (Signed)
Assessment and Plan:      Diagnosis ICD-10-CM Associated Orders   1. Hyperkalemia  E87.5 Recheck potassium to assess stability. Will notify patient and advise to continue K+ supplement at 40 mEq daily reduce to 20 mEq daily or discontinue. Reviewed return precautions.     Potassium Level      2. Palpitations  R00.2 DD include electrolyte imbalance, PVC's, symptomatic elevated BP, anxiety, hormonal fluctuations.       3. Hypercalcemia  E83.52 Parathyroid Hormone (PTH)      4. Essential hypertension  I10 BP at goal. Continue amlodipine 10 mg daily. Continue to hold chlorthalidone. Monitor home BP. Will need to add second medication for consistently elevated BP measurements.           I personally spent 30 minutes face-to-face and non-face-to-face in the care of this patient, which includes all pre, intra, and post visit time on the date of service.    Return if symptoms worsen or fail to improve. CPE scheduled 07/20/22.    HPI:      Heather Watts is here for   Chief Complaint   Patient presents with    Follow-up       Heather Watts is a 51 y.o. female with a past medical history of hypokalemia, GERD, hyperlipidemia, and hypertension who presents follow up of hypokalemia. She visited the ED on 05/31/22 and again on 06/01/22 for lightheadedness and jittery this morning in the setting of 2 weeks of heart palpations and muscle spasms of BUE. She endorses compliance with oral potassium prescribed. She states that her potassium levels started to decrease after switching from hydrochlorothiazide to chlorthalidone and amlodipine for her hypertension. Denies chest pain, fevers, nausea, or vomiting.     Hypertension: Patient presents for follow-up of hypertension. Blood pressure goal < 140/90.  Hypertension has customarily not been at goal complicated by obesity, electrolyte imbalance.  Home blood pressure readings:  averages 135/90 . Patient reports hight blood pressure in the ED. Salt intake and diet: salt not added to cooking and salt shaker not on table. Associated signs and symptoms: palpitations, peripheral edema, and feeling of heart beat was off . Patient denies: blurred vision, chest pain, dyspnea, headache, neck aches, orthopnea, paroxysmal nocturnal dyspnea, pulsating in the ears, and tiredness/fatigue. Medication compliance: taking as prescribed. She was advised by ED provider to discontinue chlorthalidone. Last dose Wednesday. She is not doing regular exercise.     Patient does note some LE edema (feet) with amlodipine however it is tolerable.  History of electrolyte imbalance with hydrochlorothiazide and chlorthalidone    Asking about calcium elevation. She is aware of it being elevate the past couple of times she has had labs. Her sister is currently being treated for high calcium and wonders if she needs her own levels addressed.     Menses are becoming very irregular. Some are only one day in duration, at times she has a period every 14 days. She notes it comes on randomly.          ROS:      Comprehensive 10 point ROS negative unless otherwise stated in the HPI.      PCMH Components:     Medication adherence and barriers to the treatment plan have been addressed. Opportunities to optimize healthy behaviors have been discussed. Patient / caregiver voiced understanding.    Past Medical/Surgical History:     Past Medical History:   Diagnosis Date    COVID-19 11/06/2020    GERD (  gastroesophageal reflux disease)     Hyperlipidemia     Hypertension     Obesity slightly     No past surgical history on file.    Family History:     Family History   Problem Relation Age of Onset    Cancer Mother     Hypertension Mother         Everyone on mother's side of family    COPD Father     Hyperlipidemia Father     Hypertension Father     Cancer Maternal Aunt     Cancer Maternal Uncle     Early death Maternal Uncle         Heart Attack    Aneurysm Maternal Uncle     Stroke Maternal Grandmother     Melanoma Neg Hx     Basal cell carcinoma Neg Hx     Squamous cell carcinoma Neg Hx        Social History:     Social History     Tobacco Use    Smoking status: Former     Packs/day: 0.50     Years: 14.00     Pack years: 7.00     Types: Cigarettes     Quit date: 03/18/2005     Years since quitting: 17.2    Smokeless tobacco: Never    Tobacco comments:     Quit smoking 2005   Vaping Use    Vaping Use: Never used   Substance Use Topics    Alcohol use: Not Currently    Drug use: Never       Allergies:     Patient has no known allergies.    Current Medications:     Current Outpatient Medications   Medication Sig Dispense Refill    amLODIPine (NORVASC) 10 MG tablet Take 1 tablet (10 mg total) by mouth daily. TAKE 1 TABLET(10 MG) BY MOUTH DAILY 30 tablet 0    chlorthalidone (HYGROTON) 25 MG tablet Take 1 tablet (25 mg total) by mouth daily. TAKE 1 TABLET(25 MG) BY MOUTH DAILY 30 tablet 0    cyclobenzaprine (FLEXERIL) 5 MG tablet Take 1-2 tabs po tid prn 60 tablet 1    doxycycline (VIBRAMYCIN) 100 MG capsule Take one capsule by mouth twice daily. 60 capsule 0    empty container Misc Use as directed to dispose of Humira pens. 1 each 2    empty container Misc Use as directed 1 each 2    fluticasone propionate (FLONASE) 50 mcg/actuation nasal spray 1 spray into each nostril daily. 16 g 0    HUMIRA PEN CITRATE FREE 40 MG/0.4 ML Inject the contents of 1 pen (40 mg total) under the skin every seven (7) days. 4 each 0    magnesium oxide (MAG-OX) 400 mg (241.3 mg elemental magnesium) tablet Take 1 tablet (400 mg total) by mouth daily. 30 tablet 0    omeprazole (PRILOSEC) 40 MG capsule Take 1 capsule (40 mg total) by mouth daily. 90 capsule 1    potassium chloride 20 MEQ ER tablet TAKE 2 TABLETS(40 MEQ) BY MOUTH DAILY 120 tablet 2    valACYclovir (VALTREX) 500 MG tablet Take 1 tablet (500 mg total) by mouth Two (2) times a day. For 3 days 30 tablet 0    methylPREDNISolone (MEDROL DOSEPACK) 4 mg tablet follow package directions (Patient not taking: Reported on 12/16/2021) 1 each 0    metoprolol succinate (TOPROL-XL) 25 MG 24 hr tablet Take 1 tablet (25  mg total) by mouth daily. TAKE 1 TABLET(25 MG) BY MOUTH DAILY 30 tablet 0     No current facility-administered medications for this visit.       Health Maintenance:     Health Maintenance   Topic Date Due    Pneumococcal Vaccine 0-64 (1 - PCV) Never done    Pap Smear (21-65)  09/07/2003    Colon Cancer Screening  Never done    COVID-19 Vaccine (3 - Pfizer risk series) 11/30/2020    Influenza Vaccine (1) 07/08/2022    Serum Creatinine Monitoring  06/02/2023    Potassium Monitoring  06/02/2023    Lipid Screening  09/24/2026    DTaP/Tdap/Td Vaccines (4 - Td or Tdap) 07/04/2028    Hepatitis C Screen  Completed       Immunizations:     Immunization History   Administered Date(s) Administered    COVID-19 VACC,MRNA,(PFIZER)(PF) 06/20/2020    HEPATITIS B VACCINE ADULT,IM(ENERGIX B, RECOMBIVAX) 07/04/2018, 08/01/2018, 12/06/2018    INFLUENZA TIV (TRI) PF (IM) 12/09/2009    Influenza Vaccine Quad (IIV4 PF) 42mo+ injectable 08/15/2018, 08/13/2019    Influenza Vaccine Quad (IIV4 W/PRESERV) 27MO+ 08/27/2021    Influenza Virus Vaccine, unspecified formulation 08/15/2018, 08/13/2019, 08/22/2020    MMR 05/26/2008, 07/11/2008    TD, ADSORBED, 5LF TETANUS TOXOID, PF(TENIVAC/DECAVAC) 07/11/2008, 07/04/2018    TdaP 05/14/2008     I have reviewed and (if needed) updated the patient's problem list, medications, allergies, past medical and surgical history, social and family history.     Vital Signs:     Wt Readings from Last 3 Encounters:   06/06/22 (!) 106.9 kg (235 lb 11.2 oz)   06/01/22 97.5 kg (215 lb)   12/16/21 (!) 104.3 kg (230 lb)     Temp Readings from Last 3 Encounters:   06/06/22 35.9 ??C (96.7 ??F)   06/01/22 36.7 ??C (98.1 ??F) (Oral)   12/16/21 36.2 ??C (97.2 ??F) (Skin)     BP Readings from Last 3 Encounters:   06/06/22 120/82   06/01/22 141/99   12/16/21 140/93     Pulse Readings from Last 3 Encounters:   06/06/22 92   06/01/22 89 12/16/21 92     Estimated body mass index is 35.84 kg/m?? as calculated from the following:    Height as of 12/16/21: 172.7 cm (5' 8).    Weight as of this encounter: 106.9 kg (235 lb 11.2 oz).  Facility age limit for growth percentiles is 20 years.      Objective:      General: Alert and oriented x3. Well-appearing. No acute distress.   HEENT:  Normocephalic.  Atraumatic. Conjunctiva and sclera normal. OP MMM without lesions.   Heart:  Regular rate and rhythm. Normal S1, S2. No murmurs, rubs or gallops.   Lungs:  No respiratory distress.  Lungs clear to auscultation. No wheezes, rhonchi, or rales.   Extremities:  No edema. Peripheral pulses normal.   Skin:  Warm, dry. No rash or lesions present.   Neuro:  Non-focal. No obvious weakness.   Psych:  Affect normal, eye contact good, speech clear and coherent.     Noralyn Pick, FNP

## 2022-06-07 NOTE — Unmapped (Addendum)
Calling about her lab results.  What is the plan for taking potassium?

## 2022-06-13 ENCOUNTER — Institutional Professional Consult (permissible substitution): Admit: 2022-06-13 | Discharge: 2022-06-14 | Payer: PRIVATE HEALTH INSURANCE | Attending: Family | Primary: Family

## 2022-06-13 DIAGNOSIS — Z1231 Encounter for screening mammogram for malignant neoplasm of breast: Principal | ICD-10-CM

## 2022-06-13 DIAGNOSIS — Z1211 Encounter for screening for malignant neoplasm of colon: Principal | ICD-10-CM

## 2022-06-13 NOTE — Unmapped (Signed)
TeleHealth Phone Encounter  This medical encounter was conducted virtually using [email or web address]  TeleHealth Protocols.    I have identified myself to the patient and conveyed my credentials to Heather Watts  I have explained the capabilities and limitations of telemedicine and the patient and myself both agree that it is appropriate for their current circumstances/symptoms.   In case we get disconnected, patient's phone number is [phone]   Is there someone else in the room? No.       Assessment and Plan:      Diagnosis ICD-10-CM Associated Orders   1. Hypokalemia  E87.6 06/06/22, K+ 4.0  Advised patient to reduce potassium to 10 mEQ and continue daily.       2. Hyperparathyroidism (CMS-HCC)  E21.3 Will need to check vitamin D to assess for secondary etiology of elevated PTH. Lab order placed today and patient agreeable to lab visit.       3. Screening mammogram for breast cancer  Z12.31 Mammography screening bilateral      4. Screening for colon cancer  Z12.11 Colonoscopy      5. Essential hypertension  I10 Consider resuming chlorthalidone at half dose. Will need to closely monitor electrolytes. Advised patient to continue to self monitor BP measurements.           Return in about 3 months (around 09/13/2022) for Next scheduled follow up.    Total time spent with patient: 17 minutes     The patient reports they are currently: at home. I spent 12 minutes on the phone with the patient on the date of service. I spent an additional 5 minutes on pre- and post-visit activities on the date of service.     The patient was physically located in [place] or a state in which I am permitted to provide care. The patient and/or parent/guardian understood that s/he may incur co-pays and cost sharing, and agreed to the telemedicine visit. The visit was reasonable and appropriate under the circumstances given the patient's presentation at the time.    The patient and/or parent/guardian has been advised of the potential risks and limitations of this mode of treatment (including, but not limited to, the absence of in-person examination) and has agreed to be treated using telemedicine. The patient's/patient's family's questions regarding telemedicine have been answered.     If the visit was completed in an ambulatory setting, the patient and/or parent/guardian has also been advised to contact their provider???s office for worsening conditions, and seek emergency medical treatment and/or call 911 if the patient deems either necessary.        HPI:      Heather Watts is here for   Chief Complaint   Patient presents with    Results     Go over her lab results.  Has her mammo scheduled for September.  She will schedule her pap.    Hypertension     BP running high.  BP today 135-140/100.    Colon Cancer Screening     Agrees to colonoscopy.  Number for GI given to schedule.     Patient scheduled for virtual visit to discuss recent lab results and plan of care.   ED visit 06/01/22, potassium 3.2   Currently taking potassium supplement, 20 mEQ daily.   She is also taking magnesium per advice of ED provider.     Patient has a history of consistently elevated calcium over the past 1-2 years prompting assessment of PTH. This result on 06/06/22 was elevated.  No recent vitamin D on record.     Patient has continued to hold chlorthalidone. She reports an elevated BP measurement today of 135-140/100. Denies any associated symptoms.            ROS:      Comprehensive 10 point ROS negative unless otherwise stated in the HPI.      PCMH Components:     Medication adherence and barriers to the treatment plan have been addressed. Opportunities to optimize healthy behaviors have been discussed. Patient / caregiver voiced understanding.      Past Medical/Surgical History:     Past Medical History:   Diagnosis Date    COVID-19 11/06/2020    GERD (gastroesophageal reflux disease)     Hyperlipidemia     Hypertension     Obesity slightly     No past surgical history on file.    Family History:     Family History   Problem Relation Age of Onset    Cancer Mother     Hypertension Mother         Everyone on mother's side of family    COPD Father     Hyperlipidemia Father     Hypertension Father     Cancer Maternal Aunt     Cancer Maternal Uncle     Early death Maternal Uncle         Heart Attack    Aneurysm Maternal Uncle     Stroke Maternal Grandmother     Melanoma Neg Hx     Basal cell carcinoma Neg Hx     Squamous cell carcinoma Neg Hx        Social History:     Social History     Tobacco Use    Smoking status: Former     Packs/day: 0.50     Years: 14.00     Additional pack years: 0.00     Total pack years: 7.00     Types: Cigarettes     Quit date: 03/18/2005     Years since quitting: 17.2    Smokeless tobacco: Never    Tobacco comments:     Quit smoking 2005   Vaping Use    Vaping Use: Never used   Substance Use Topics    Alcohol use: Not Currently    Drug use: Never       Allergies:     Patient has no known allergies.    Current Medications:     Current Outpatient Medications   Medication Sig Dispense Refill    amLODIPine (NORVASC) 10 MG tablet Take 1 tablet (10 mg total) by mouth daily. TAKE 1 TABLET(10 MG) BY MOUTH DAILY 30 tablet 0    chlorthalidone (HYGROTON) 25 MG tablet Take 1 tablet (25 mg total) by mouth daily. TAKE 1 TABLET(25 MG) BY MOUTH DAILY 30 tablet 0    cyclobenzaprine (FLEXERIL) 5 MG tablet Take 1-2 tabs po tid prn 60 tablet 1    HUMIRA PEN CITRATE FREE 40 MG/0.4 ML Inject the contents of 1 pen (40 mg total) under the skin every seven (7) days. 4 each 0    magnesium oxide (MAG-OX) 400 mg (241.3 mg elemental magnesium) tablet Take 1 tablet (400 mg total) by mouth daily. 30 tablet 0    potassium chloride 20 MEQ ER tablet TAKE 2 TABLETS(40 MEQ) BY MOUTH DAILY (Patient taking differently: Take 1 tablet (20 mEq total) by mouth daily.) 120 tablet 2    valACYclovir (VALTREX) 500 MG tablet  Take 1 tablet (500 mg total) by mouth Two (2) times a day. For 3 days 30 tablet 0    doxycycline (VIBRAMYCIN) 100 MG capsule Take one capsule by mouth twice daily. 60 capsule 0    empty container Misc Use as directed to dispose of Humira pens. 1 each 2    empty container Misc Use as directed 1 each 2    fluticasone propionate (FLONASE) 50 mcg/actuation nasal spray 1 spray into each nostril daily. (Patient not taking: Reported on 06/13/2022) 16 g 0    omeprazole (PRILOSEC) 40 MG capsule Take 1 capsule (40 mg total) by mouth daily. 90 capsule 1     No current facility-administered medications for this visit.       Health Maintenance:     Health Maintenance   Topic Date Due    Pneumococcal Vaccine 0-64 (1 - PCV) Never done    HPV Cotest with Pap Smear (21-65)  Never done    Pap Smear with Cotest HPV (21-65)  09/06/2005    Mammogram Start Age 32  Never done    Colon Cancer Screening  Never done    COVID-19 Vaccine (3 - Pfizer risk series) 11/30/2020    Influenza Vaccine (1) 07/08/2022    Serum Creatinine Monitoring  06/02/2023    Potassium Monitoring  06/07/2023    Lipid Screening  09/24/2026    DTaP/Tdap/Td Vaccines (4 - Td or Tdap) 07/04/2028    Hepatitis C Screen  Completed       Immunizations:     Immunization History   Administered Date(s) Administered    COVID-19 VACC,MRNA,(PFIZER)(PF) 06/20/2020, 11/02/2020    HEPATITIS B VACCINE ADULT,IM(ENERGIX B, RECOMBIVAX) 07/04/2018, 08/01/2018, 12/06/2018    INFLUENZA TIV (TRI) PF (IM) 12/09/2009    Influenza Vaccine Quad (IIV4 PF) 14mo+ injectable 08/15/2018, 08/13/2019    Influenza Vaccine Quad (IIV4 W/PRESERV) 70MO+ 08/27/2021    Influenza Virus Vaccine, unspecified formulation 08/15/2018, 08/13/2019, 08/22/2020    MMR 05/26/2008, 07/11/2008    TD, ADSORBED, 5LF TETANUS TOXOID, PF(TENIVAC/DECAVAC) 07/11/2008, 07/04/2018    TdaP 05/14/2008     I have reviewed and (if needed) updated the patient's problem list, medications, allergies, past medical and surgical history, social and family history.     Vital Signs:     Wt Readings from Last 3 Encounters:   06/06/22 (!) 106.9 kg (235 lb 11.2 oz)   06/01/22 97.5 kg (215 lb)   12/16/21 (!) 104.3 kg (230 lb)     Temp Readings from Last 3 Encounters:   06/06/22 35.9 ??C (96.7 ??F)   06/01/22 36.7 ??C (98.1 ??F) (Oral)   12/16/21 36.2 ??C (97.2 ??F) (Skin)     BP Readings from Last 3 Encounters:   06/06/22 120/82   06/01/22 141/99   12/16/21 140/93     Pulse Readings from Last 3 Encounters:   06/06/22 92   06/01/22 89   12/16/21 92     Estimated body mass index is 35.84 kg/m?? as calculated from the following:    Height as of 12/16/21: 172.7 cm (5' 8).    Weight as of 06/06/22: 106.9 kg (235 lb 11.2 oz).  No height and weight on file for this encounter.        Objective:      As part of this Telephone Visit, no in-person exam was conducted.       Noralyn Pick, FNP

## 2022-06-14 NOTE — Unmapped (Signed)
[name] and Heather Watts discussed at her phone appt yesterday.

## 2022-06-15 ENCOUNTER — Other Ambulatory Visit: Admit: 2022-06-15 | Discharge: 2022-06-16 | Payer: PRIVATE HEALTH INSURANCE

## 2022-06-15 DIAGNOSIS — R7989 Other specified abnormal findings of blood chemistry: Principal | ICD-10-CM

## 2022-06-16 NOTE — Unmapped (Signed)
[facility] Refill Coordination Note    Specialty Medication(s) to be Shipped:   Inflammatory Disorders: Humira    Other medication(s) to be shipped: No additional medications requested for fill at this time     Heather Watts, DOB: 1970/12/26  Phone: [phone] (home)       All above HIPAA information was verified with patient.     Was a Nurse, learning disability used for this call? No    Completed refill call assessment today to schedule patient's medication shipment from the [facility] [phone]).  All relevant notes have been reviewed.     Specialty medication(s) and dose(s) confirmed: Regimen is correct and unchanged.   Changes to medications: [name] reports no changes at this time.  Changes to insurance: No  New side effects reported not previously addressed with a pharmacist or physician: None reported  Questions for the pharmacist: No    Confirmed patient received a Conservation officer, historic buildings and a Surveyor, mining with first shipment. The patient will receive a drug information handout for each medication shipped and additional FDA Medication Guides as required.       DISEASE/MEDICATION-SPECIFIC INFORMATION        For patients on injectable medications: Patient currently has 1 doses left.  Next injection is scheduled for 06/18/2022.    SPECIALTY MEDICATION ADHERENCE     Medication Adherence    Patient reported X missed doses in the last month: 0  Specialty Medication: Humira  Patient is on additional specialty medications: No  Any gaps in refill history greater than 2 weeks in the last 3 months: no  Demonstrates understanding of importance of adherence: yes  Informant: patient  Reliability of informant: reliable              Confirmed plan for next specialty medication refill: delivery by pharmacy  Refills needed for supportive medications: not needed              Were doses missed due to medication being on hold? No     Humira Cf 40 mg/0.4 ml: 0 on hand    REFERRAL TO PHARMACIST     Referral to the pharmacist: Not needed      [facility]     Shipping address confirmed in Epic.     Delivery Scheduled: Yes, Expected medication delivery date: 06/21/2022.     Medication will be delivered via Same Day Courier to the prescription address in Epic WAM.    Valere Dross   [facility] Specialty Technician

## 2022-06-20 LAB — VITAMIN D 25 HYDROXY: VITAMIN D, TOTAL (25OH): 10.9 ng/mL — ABNORMAL LOW (ref 20.0–80.0)

## 2022-06-22 DIAGNOSIS — L732 Hidradenitis suppurativa: Principal | ICD-10-CM

## 2022-06-22 MED ORDER — HUMIRA PEN CITRATE FREE 40 MG/0.4 ML
SUBCUTANEOUS | 0 refills | 28 days
Start: 2022-06-22 — End: ?

## 2022-06-22 NOTE — Unmapped (Signed)
Called, no answer    Thanks,  Courtney

## 2022-06-22 NOTE — Unmapped (Signed)
You were going to send in the new Vitamin D RX and pharmacy doesn't have it.  Please send to [facility] in [place].

## 2022-06-22 NOTE — Unmapped (Signed)
Patient has not been seen in over a year and will need a appointment for refills.

## 2022-06-23 DIAGNOSIS — E559 Vitamin D deficiency, unspecified: Principal | ICD-10-CM

## 2022-06-23 MED ORDER — ERGOCALCIFEROL (VITAMIN D2) 1,250 MCG (50,000 UNIT) CAPSULE
ORAL_CAPSULE | ORAL | 2 refills | 28.00000 days | Status: CP
Start: 2022-06-23 — End: 2023-06-23

## 2022-06-24 NOTE — Unmapped (Signed)
Patient aware on another task.

## 2022-06-29 DIAGNOSIS — I10 Essential (primary) hypertension: Principal | ICD-10-CM

## 2022-06-29 MED ORDER — AMLODIPINE 10 MG TABLET
ORAL_TABLET | Freq: Every day | ORAL | 0 refills | 30 days | Status: CP
Start: 2022-06-29 — End: ?

## 2022-06-29 NOTE — Unmapped (Signed)
Patient is requesting the following refill  Requested Prescriptions     Pending Prescriptions Disp Refills    amLODIPine (NORVASC) 10 MG tablet [Pharmacy Med Name: AMLODIPINE BESYLATE 10MG  TABLETS] 30 tablet 0     Sig: TAKE 1 TABLET(10 MG) BY MOUTH DAILY       Recent Visits  Date Type Provider Dept   06/06/22 Office Visit Keri Rosita Fire, FNP [facility] [address] [facility]   Showing recent visits within past 365 days with a meds authorizing provider and meeting all other requirements  Future Appointments  Date Type Provider Dept   07/20/22 Appointment Keri Rosita Fire, FNP [facility] [address] [facility]   Showing future appointments within next 365 days with a meds authorizing provider and meeting all other requirements       Labs: Creatinine:   Creatinine (mg/dL)   Date Value   16/08/9603 0.70   03/03/2021 0.89    Potassium:   Potassium (mmol/L)   Date Value   06/06/2022 4.0   03/03/2021 3.5    Sodium:   Sodium (mmol/L)   Date Value   06/01/2022 137   03/03/2021 142

## 2022-07-14 DIAGNOSIS — L732 Hidradenitis suppurativa: Principal | ICD-10-CM

## 2022-07-14 MED ORDER — HUMIRA PEN CITRATE FREE 40 MG/0.4 ML
SUBCUTANEOUS | 0 refills | 28 days
Start: 2022-07-14 — End: ?

## 2022-07-15 MED ORDER — HUMIRA PEN CITRATE FREE 40 MG/0.4 ML
SUBCUTANEOUS | 3 refills | 28 days | Status: CP
Start: 2022-07-15 — End: ?
  Filled 2022-07-22: qty 4, 28d supply, fill #0

## 2022-07-19 MED ORDER — EMPTY CONTAINER
2 refills | 0 days
Start: 2022-07-19 — End: ?

## 2022-07-19 NOTE — Unmapped (Signed)
[facility] Refill Coordination Note    Specialty Medication(s) to be Shipped:   Inflammatory Disorders: Humira    Other medication(s) to be shipped: [employer] Heather Watts, DOB: 28-Oct-1971  Phone: [phone] (home)       All above HIPAA information was verified with patient.     Was a Nurse, learning disability used for this call? No    Completed refill call assessment today to schedule patient's medication shipment from the [facility] [phone]).  All relevant notes have been reviewed.     Specialty medication(s) and dose(s) confirmed: Regimen is correct and unchanged.   Changes to medications: Deiondra reports no changes at this time.  Changes to insurance: No  New side effects reported not previously addressed with a pharmacist or physician: None reported  Questions for the pharmacist: No    Confirmed patient received a Conservation officer, historic buildings and a Surveyor, mining with first shipment. The patient will receive a drug information handout for each medication shipped and additional FDA Medication Guides as required.       DISEASE/MEDICATION-SPECIFIC INFORMATION        For patients on injectable medications: Patient currently has 0 doses left.  Next injection is scheduled for 07/23/2022.    SPECIALTY MEDICATION ADHERENCE     Medication Adherence    Patient reported X missed doses in the last month: 0  Specialty Medication: Humira  Patient is on additional specialty medications: No  Any gaps in refill history greater than 2 weeks in the last 3 months: no  Demonstrates understanding of importance of adherence: yes  Informant: patient  Reliability of informant: reliable              Confirmed plan for next specialty medication refill: delivery by pharmacy  Refills needed for supportive medications: not needed              Were doses missed due to medication being on hold? No     Humira Cf 40 mg/0.4 ml: 0 on hand    REFERRAL TO PHARMACIST     Referral to the pharmacist: Not needed      [facility]     Shipping address confirmed in Epic.     Delivery Scheduled: Yes, Expected medication delivery date: 07/22/2022.     Medication will be delivered via Same Day Courier to the prescription address in Epic WAM.    Heather Watts   [facility] Specialty Technician

## 2022-07-20 ENCOUNTER — Ambulatory Visit: Admit: 2022-07-20 | Discharge: 2022-07-21 | Payer: PRIVATE HEALTH INSURANCE | Attending: Family | Primary: Family

## 2022-07-20 DIAGNOSIS — G8929 Other chronic pain: Principal | ICD-10-CM

## 2022-07-20 DIAGNOSIS — Z0000 Encounter for general adult medical examination without abnormal findings: Principal | ICD-10-CM

## 2022-07-20 DIAGNOSIS — E559 Vitamin D deficiency, unspecified: Principal | ICD-10-CM

## 2022-07-20 DIAGNOSIS — Z124 Encounter for screening for malignant neoplasm of cervix: Principal | ICD-10-CM

## 2022-07-20 DIAGNOSIS — I10 Essential (primary) hypertension: Principal | ICD-10-CM

## 2022-07-20 DIAGNOSIS — M25512 Pain in left shoulder: Principal | ICD-10-CM

## 2022-07-20 DIAGNOSIS — R7989 Other specified abnormal findings of blood chemistry: Principal | ICD-10-CM

## 2022-07-20 LAB — PARATHYROID HORMONE (PTH): PARATHYROID HORMONE INTACT: 89.9 pg/mL — ABNORMAL HIGH (ref 18.4–80.1)

## 2022-07-20 LAB — CALCIUM: CALCIUM: 9.9 mg/dL (ref 8.7–10.4)

## 2022-07-20 MED ORDER — VALACYCLOVIR 500 MG TABLET
ORAL_TABLET | Freq: Two times a day (BID) | ORAL | 5 refills | 15.00000 days | Status: CP
Start: 2022-07-20 — End: 2023-07-20

## 2022-07-20 MED ORDER — CHLORTHALIDONE 25 MG TABLET
ORAL_TABLET | Freq: Every day | ORAL | 3 refills | 90.00000 days | Status: CP
Start: 2022-07-20 — End: 2023-07-20

## 2022-07-20 MED ORDER — AMLODIPINE 10 MG TABLET
ORAL_TABLET | Freq: Every day | ORAL | 3 refills | 90 days | Status: CP
Start: 2022-07-20 — End: 2023-07-21

## 2022-07-20 NOTE — Unmapped (Signed)
Assessment/Plan:      [name] was seen today for annual exam, immunizations and colon cancer screening.    Diagnoses and all orders for this visit:    Annual physical exam    Essential hypertension  -     amLODIPine (NORVASC) 10 MG tablet; Take 1 tablet (10 mg total) by mouth daily. TAKE 1 TABLET(10 MG) BY MOUTH DAILY  -     chlorthalidone (HYGROTON) 25 MG tablet; Take 1 tablet (25 mg total) by mouth daily. TAKE 1 TABLET(25 MG) BY MOUTH DAILY    Chronic left shoulder pain  -     Ambulatory referral to Orthopedic Surgery; Future    Hypercalcemia  -     Calcium    Vitamin D deficiency  -     Vitamin D 25 Hydroxy (25OH D2 + D3)    Elevated PTHrP level  -     Parathyroid Hormone (PTH)    Other orders  -     valACYclovir (VALTREX) 500 MG tablet; Take 1 tablet (500 mg total) by mouth Two (2) times a day. For 3 days        Barriers to goals identified and addressed. Pertinent handouts were given today and reviewed with the patient as indicated.  The Care Plan and Self-Management goals have been included on the AVS and the AVS has been printed.   I encouraged the patient to keep regular logs for me to review at their next visit. Any outside resources or referrals needed at this time are noted above. Patient's current medications have been reviewed. Any new medications prescribed have been discussed, and side effects have been addressed.  Have assessed the patient's understanding, respsonse, and barriers to adherence to medications.Patient voiced understanding and all questions have been answered to satisfaction.     Subjective:      Patient ID: Heather Watts is a 51 y.o. female who  is being seen for her  health maintenance exam. Her last pap smear was [id number] years ago.   Patient's last menstrual period was 07/04/2022 (exact date).    Chief Complaint   Patient presents with    Annual Exam     Refuses pap and breast exam.  She will schedule her mammogram.  HSV breakout on LT breast.  Wanting to redo lab work. Immunizations     Declines flu shot and Prevnar 20 .    Colon Cancer Screening     GI number given again.       Lifestyle:  Occupation: working at [employer]   Exercise: no devoted routine   Diet : limits fried foods, goes home at lunch, eats a lot of vegetables.   Caffeine use: drinks sweet tea daily, too much   Last dental: within the past year   Last vision: no recent eye exam. Intends to schedule. More difficulty with print, wearing readers.     7-8 years ago she received a cortisone injection in her left shoulder (frozen shoulder). Currently having pain of her left shoulder radiating down left upper arm. No pain at rest. Abduction induces pain.     Concerned about low blood pressure.   Edema has resolved.      Social History:     Social History     Socioeconomic History    Marital status: Single     Spouse name: None    Number of children: None    Years of education: None    Highest education level: None  Tobacco Use    Smoking status: Former     Packs/day: 0.50     Years: 14.00     Additional pack years: 0.00     Total pack years: 7.00     Types: Cigarettes     Quit date: 03/18/2005     Years since quitting: 17.3    Smokeless tobacco: Never    Tobacco comments:     Quit smoking 2005   Vaping Use    Vaping Use: Never used   Substance and Sexual Activity    Alcohol use: Not Currently    Drug use: Never    Sexual activity: Not Currently     Partners: Male     Birth control/protection: Abstinence   Other Topics Concern    Do you use sunscreen? No    Tanning bed use? No    Are you easily burned? No    Excessive sun exposure? No    Blistering sunburns? No     Social Determinants of Health     Financial Resource Strain: Low Risk  (11/17/2021)    Overall Financial Resource Strain (CARDIA)     Difficulty of Paying Living Expenses: Not very hard   Food Insecurity: No Food Insecurity (11/17/2021)    Hunger Vital Sign     Worried About Running Out of Food in the Last Year: Never true     Ran Out of Food in the Last Year: Never true   Transportation Needs: No Transportation Needs (11/17/2021)    PRAPARE - Therapist, art (Medical): No     Lack of Transportation (Non-Medical): No       Past Medical/Surgical History:     Past Medical History:   Diagnosis Date    COVID-19 11/06/2020    GERD (gastroesophageal reflux disease)     Hyperlipidemia     Hypertension     Obesity slightly     No past surgical history on file.    Family History:     Family History   Problem Relation Age of Onset    Cancer Mother     Hypertension Mother         Everyone on mother's side of family    COPD Father     Hyperlipidemia Father     Hypertension Father     Cancer Maternal Aunt     Cancer Maternal Uncle     Early death Maternal Uncle         Heart Attack    Aneurysm Maternal Uncle     Stroke Maternal Grandmother     Melanoma Neg Hx     Basal cell carcinoma Neg Hx     Squamous cell carcinoma Neg Hx        Allergies:     Patient has no known allergies.    Current Medications:     Current Outpatient Medications   Medication Sig Dispense Refill    amLODIPine (NORVASC) 10 MG tablet TAKE 1 TABLET(10 MG) BY MOUTH DAILY 30 tablet 0    chlorthalidone (HYGROTON) 25 MG tablet Take 1 tablet (25 mg total) by mouth daily. TAKE 1 TABLET(25 MG) BY MOUTH DAILY 30 tablet 0    cyclobenzaprine (FLEXERIL) 5 MG tablet Take 1-2 tabs po tid prn 60 tablet 1    empty container Misc Use as directed to dispose of needles. When full, make sure lid is closed tightly then dispose of container in trash. 1 each 2    ergocalciferol-1,250  mcg, 50,000 unit, (DRISDOL) 1,250 mcg (50,000 unit) capsule Take 1 capsule (1,250 mcg total) by mouth once a week. 4 capsule 2    HUMIRA PEN CITRATE FREE 40 MG/0.4 ML Inject the contents of 1 pen (40 mg total) under the skin every seven (7) days. 4 each 3    potassium chloride 20 MEQ ER tablet TAKE 2 TABLETS(40 MEQ) BY MOUTH DAILY (Patient taking differently: Take 1 tablet (20 mEq total) by mouth daily.) 120 tablet 2    valACYclovir (VALTREX) 500 MG tablet Take 1 tablet (500 mg total) by mouth Two (2) times a day. For 3 days 30 tablet 0    fluticasone propionate (FLONASE) 50 mcg/actuation nasal spray 1 spray into each nostril daily. (Patient not taking: Reported on 06/13/2022) 16 g 0     No current facility-administered medications for this visit.       Health Maintenance:     Health Maintenance Summary w/Most Recent Date    -        Overdue - Pneumococcal Vaccine 0-64 (1 - PCV) Overdue - never done      No completion history exists for this topic.              Overdue - HPV Cotest with Pap Smear (21-65) (Every 5 Years) Overdue - never done      No completion history exists for this topic.              Overdue - Pap Smear with Cotest HPV (21-65) (Every 5 Years) Overdue since 09/06/2005      09/06/2000  Pap Smear (Historical Result)              Ordered - Mammogram Start Age 5 (Yearly) Ordered on 06/19/2022      No completion history exists for this topic.              Ordered - Colon Cancer Screening (Colonoscopy - Every 10 Years) Ordered on 06/19/2022      No completion history exists for this topic.              Overdue - COVID-19 Vaccine (3 - Pfizer risk series) Overdue since 11/30/2020      11/02/2020  Imm Admin: COVID-19 VACC,MRNA,(PFIZER)(PF)    06/20/2020  Imm Admin: COVID-19 VACC,MRNA,(PFIZER)(PF)              Influenza Vaccine (1) Due since 07/08/2022      08/27/2021  Imm Admin: Influenza Vaccine Quad (IIV4 W/PRESERV) 65MO+    08/22/2020  Imm Admin: Influenza Virus Vaccine, unspecified formulation    08/13/2019  Imm Admin: Influenza Vaccine Quad (IIV4 PF) 14mo+ injectable    08/13/2019  Imm Admin: Influenza Virus Vaccine, unspecified formulation    08/15/2018  Imm Admin: Influenza Vaccine Quad (IIV4 PF) 61mo+ injectable    Only the first 5 history entries have been loaded, but more history exists.              Serum Creatinine Monitoring (Yearly) Next due on 06/02/2023      06/01/2022  Creatinine component of Basic Metabolic Panel    12/16/2021  Creatinine component of Comprehensive metabolic panel    09/24/2021  Creatinine component of Comprehensive Metabolic Panel    04/30/2021  Creatinine component of Basic Metabolic Panel    03/03/2021  Basic Metabolic Panel    Only the first 5 history entries have been loaded, but more history exists.  Potassium Monitoring (Yearly) Next due on 06/07/2023      06/06/2022  Potassium component of Potassium Level    06/01/2022  Potassium component of Basic Metabolic Panel    12/16/2021  Potassium component of Comprehensive metabolic panel    09/24/2021  Potassium component of Comprehensive Metabolic Panel    04/30/2021  Potassium component of Basic Metabolic Panel    Only the first 5 history entries have been loaded, but more history exists.              Lipid Screening (Every 5 Years) Next due on 09/24/2026      09/24/2021  SmartData: [facility] TOTAL CHOLESTEROL AND HDL COMPLETE    09/24/2021  Lipid Panel    04/30/2021  Lipid Panel    08/16/2019  Lipid Panel              DTaP/Tdap/Td Vaccines (4 - Td or Tdap) Next due on 07/04/2028      07/04/2018  Imm Admin: TD, ADSORBED, 5LF TETANUS TOXOID, PF(TENIVAC/DECAVAC)    07/11/2008  Imm Admin: TD, ADSORBED, 5LF TETANUS TOXOID, PF(TENIVAC/DECAVAC)    05/14/2008  Imm Admin: TdaP              Hepatitis C Screen  Completed      05/04/2020  Hepatitis C Antibody                    Immunizations:     Immunization History   Administered Date(s) Administered    COVID-19 VACC,MRNA,(PFIZER)(PF) 06/20/2020, 11/02/2020    HEPATITIS B VACCINE ADULT,IM(ENERGIX B, RECOMBIVAX) 07/04/2018, 08/01/2018, 12/06/2018    INFLUENZA TIV (TRI) PF (IM) 12/09/2009    Influenza Vaccine Quad (IIV4 PF) 85mo+ injectable 08/15/2018, 08/13/2019    Influenza Vaccine Quad (IIV4 W/PRESERV) 20MO+ 08/27/2021    Influenza Virus Vaccine, unspecified formulation 08/15/2018, 08/13/2019, 08/22/2020    MMR 05/26/2008, 07/11/2008    TD, ADSORBED, 5LF TETANUS TOXOID, PF(TENIVAC/DECAVAC) 07/11/2008, 07/04/2018    TdaP 05/14/2008       I have reviewed and (if needed) updated the patient's problem list, medications, allergies, past medical and surgical history, preventive services, and social and family history.    ROS:     General: no fatigue, unexpected weight loss or gain, overall feels well  ENT: denies visual problems ear symptoms, throat symptoms, nasal congestion,   Cardiovascular: denies chest pain, palpitations, tachycardia, lower extremity swelling, claudication  Respiratory: denies dyspnea, dyspnea on exertion, wheezing, cough  Gastrointestinal: denies nausea, vomiting, dyspepsia, dysphagia, chronic constipation, diarrhea, melana, hematochezia  GU:No abnormal vaginal discharge, pelvic pain, irregular menses, vasomotor symptoms  Musculoskeletal: denies joint pains, muscle pain or weakness  Integumentary: positive for HS, worse during menses (breast and axilla)  Scheduled with dermatology next month   Neurological: denies dizziness, numbness, tingling, syncope  Intermittent headaches.   Psych: Denies symptoms suggesting anxiety, depression, sleep disturbance    Eats from noon to 6 pm.     Vital Signs:     Wt Readings from Last 3 Encounters:   07/20/22 (!) 105.2 kg (232 lb)   06/06/22 (!) 106.9 kg (235 lb 11.2 oz)   06/01/22 97.5 kg (215 lb)     Temp Readings from Last 3 Encounters:   07/20/22 37.1 ??C (98.8 ??F) (Oral)   06/06/22 35.9 ??C (96.7 ??F)   06/01/22 36.7 ??C (98.1 ??F) (Oral)     BP Readings from Last 3 Encounters:   07/20/22 116/66   06/06/22 120/82   06/01/22 141/99     Pulse  Readings from Last 3 Encounters:   07/20/22 94   06/06/22 92   06/01/22 89     Estimated body mass index is 35.28 kg/m?? as calculated from the following:    Height as of this encounter: 172.7 cm (5' 8).    Weight as of this encounter: 105.2 kg (232 lb).  Facility age limit for growth %iles is 20 years.        Objective:      General Appearance: Alert, cooperative, no distress, appears stated age.   Head and Neck: Normocephalic, atraumatic. No abnormal masses. No thyromegaly. No bruits.   EENT: Eyes: PERRLA, Conjuntiva clear; Ears: bilateral TMs normal; Nose: septum and turbinates unremarkable; Throat: mucus membranes moist. No tonsilar enlargement or exudates. No OP erythema.  Cardiovascular:  Regular rate and rhythm. No murmur or ectopy.  Respiratory:Lungs clear to auscultation bilaterally,  Respiratory effort unremarkable. No wheezes or rhonchi.   Breast: Symmetrical. No masses or lumps. No nipple discharge.  No abnormal axillary nodes.  Gastrointestinal:Abdomen soft and non-tender.  No hernia,organomegaly or masses.  Musculoskeletal: Gait and station unremarkable. Normal ROM major joints. Normal strength and tone of proximal muscles.   Extremities:No edema.  Normal peripheral pulses bilaterally.   Integumentary: Warm and dry. No rashes.   LYMPH NODES: Cervical, supraclavicular, and axillary nodes normal.  Neurologic: Alert and oriented to place and time. Normal deep tendon reflexes. Normal sensation. CN II-XII grossly intact.   Psychiatric: Mood and affect appropriate for situation.      Labs:     No results found for this visit on 07/20/22.      Heather Pick, FNP

## 2022-07-21 ENCOUNTER — Ambulatory Visit: Payer: BLUE CROSS/BLUE SHIELD | Attending: Cardiology | Admitting: Cardiology

## 2022-07-22 LAB — VITAMIN D 25 HYDROXY: VITAMIN D, TOTAL (25OH): 21.5 ng/mL (ref 20.0–80.0)

## 2022-07-22 MED FILL — EMPTY CONTAINER: 120 days supply | Qty: 1 | Fill #0

## 2022-08-12 NOTE — Unmapped (Signed)
[facility] Pharm[facility] Refill Coordination Note    Specialty Medication(s) to be Shipped:   Inflammatory Disorders: Humira    Other medication(s) to be shipped: No additional medications requested for fill at this time     Heather Watts, DOB: Apr 12, 1971  Phone: [phone] (home)       All above HIPAA information was verified with patient.     Was a Nurse, learning disability used for this call? No    Completed refill call assessment today to schedule patient's medication shipment from the [facility] [phone]).  All relevant notes have been reviewed.     Specialty medication(s) and dose(s) confirmed: Regimen is correct and unchanged.   Changes to medications: [name] reports no changes at this time.  Changes to insurance: No  New side effects reported not previously addressed with a pharmacist or physician: None reported  Questions for the pharmacist: No    Confirmed patient received a Conservation officer, historic buildings and a Surveyor, mining with first shipment. The patient will receive a drug information handout for each medication shipped and additional FDA Medication Guides as required.       DISEASE/MEDICATION-SPECIFIC INFORMATION        For patients on injectable medications: Patient currently has 1 doses left.  Next injection is scheduled for 10/7.    SPECIALTY MEDICATION ADHERENCE     Medication Adherence    Patient reported X missed doses in the last month: 0  Specialty Medication: Humira  Patient is on additional specialty medications: No  Any gaps in refill history greater than 2 weeks in the last 3 months: no  Demonstrates understanding of importance of adherence: yes  Informant: patient  Reliability of informant: reliable              Confirmed plan for next specialty medication refill: delivery by pharmacy  Refills needed for supportive medications: not needed              Were doses missed due to medication being on hold? No     Humira Cf 40 mg/0.4 ml: 7 days on hand    REFERRAL TO PHARMACIST Referral to the pharmacist: Not needed      [facility]     Shipping address confirmed in Epic.     Delivery Scheduled: Yes, Expected medication delivery date: 10/12.     Medication will be delivered via Same Day Courier to the prescription address in Epic WAM.    Heather Watts   [facility] Specialty Technician

## 2022-08-18 MED FILL — HUMIRA PEN CITRATE FREE 40 MG/0.4 ML: SUBCUTANEOUS | 28 days supply | Qty: 4 | Fill #1

## 2022-08-23 MED ORDER — AMLODIPINE 10 MG TABLET
ORAL_TABLET | 3 refills | 0 days
Start: 2022-08-23 — End: ?

## 2022-09-06 ENCOUNTER — Ambulatory Visit
Admit: 2022-09-06 | Discharge: 2022-09-07 | Payer: PRIVATE HEALTH INSURANCE | Attending: Rehabilitative and Restorative Service Providers" | Primary: Rehabilitative and Restorative Service Providers"

## 2022-09-06 ENCOUNTER — Ambulatory Visit: Admit: 2022-09-06 | Discharge: 2022-09-07 | Payer: PRIVATE HEALTH INSURANCE

## 2022-09-06 DIAGNOSIS — L732 Hidradenitis suppurativa: Principal | ICD-10-CM

## 2022-09-06 DIAGNOSIS — Z79899 Other long term (current) drug therapy: Principal | ICD-10-CM

## 2022-09-06 DIAGNOSIS — L2089 Other atopic dermatitis: Principal | ICD-10-CM

## 2022-09-06 MED ORDER — HUMIRA PEN CROHN'S-ULC COLITIS-HID SUP STARTER 40 MG/0.8 ML SUBCUT KIT
SUBCUTANEOUS | 5 refills | 28.00000 days | Status: CP
Start: 2022-09-06 — End: 2023-03-05

## 2022-09-06 MED ORDER — MAGNESIUM OXIDE 400 MG (241.3 MG MAGNESIUM) TABLET
ORAL_TABLET | Freq: Every day | ORAL | 1 refills | 90.00000 days | Status: CP
Start: 2022-09-06 — End: 2023-09-06

## 2022-09-06 MED ORDER — CLOBETASOL 0.05 % TOPICAL OINTMENT
OPHTHALMIC | 5 refills | 0.00000 days | Status: CP
Start: 2022-09-06 — End: ?

## 2022-09-06 MED ORDER — HUMIRA(CF) PEN 80 MG/0.8 ML SUBCUTANEOUS KIT
SUBCUTANEOUS | 6 refills | 28.00000 days | Status: CP
Start: 2022-09-06 — End: 2023-03-21

## 2022-09-06 NOTE — Unmapped (Signed)
Dermatology Note     Assessment and Plan:      Hidradenitis suppurativa, Chronic problem, stable on Humira  - Actively draining sinus tract of L axilla observed on exam today  - Plan to increase Humira from 40mg  weekly to 80mg  weekly per the following: Zouboulis CC, Pedro Earls, Adrian Saran RD, Lorrin Mais, Delorme I, Deborah Chalk, Reguiai Z, Caleb Popp E, Alfageme Rold??n F. Adalimumab Dose Intensification in Recalcitrant Hidradenitis Suppurativa/Acne Inversa. Dermatology. 1610;960(4):54-09. doi: 10.1159/000503606. Epub 2019 Oct 18. PMID: 81191478.  - adalimumab (HUMIRA PEN CROHNS-UC-HS START) 40 mg/0.8 mL injection PEN; Inject 1.6 mL (80 mg total) under the skin once a week. Maintenance dose. Sent to [facility] Shared [facility].     High risk medication use, Humira  - Quantiferon TB Gold Plus ordered today    Dermatitis, L ankle  - patient has a pruritic rash that arose a few months ago, improving  - start clobetasoL (TEMOVATE) 0.05 % ointment; Apply the medication twice daily to stubborn areas of the skin until smooth. Then stop and re-start as the skin changes come back.  - patient counseled regarding the risks of topical corticosteroids    The patient was advised to call for an appointment should any new, changing, or symptomatic lesions develop.     RTC: Return in about 6 months (around 03/07/2023) for HS. or sooner as needed   _________________________________________________________________      Chief Complaint     Chief Complaint   Patient presents with    Follow-up     Follow up HS patient reports no changes       HPI     Heather Watts is a 51 y.o. female who presents as a returning patient (last seen No previous visit found) to Dermatology for follow up of HS.    Today patient reports:  - She is on Humira for HS  - Disease is stable  - Usually flares during her menstrual cycle  - Mainly flares in her L axilla  - Patient was previously referred to plastic surgery, however, she never saw them    The patient denies any other new or changing lesions or areas of concern.     Pertinent Past Medical History     Past Medical History, Family History, Social History, Medication List, Allergies, and Problem List were reviewed in the rooming section of Epic.     ROS: Other than symptoms mentioned in the HPI, no fevers, chills, or other skin complaints    Physical Examination     GENERAL: Well-appearing female in no acute distress, resting comfortably.  NEURO: Alert and oriented, answers questions appropriately  SKIN: Examination of the chest, bilateral upper extremities, and bilateral lower extremities was performed    - draining sinus tract of the L axilla  - scarring of the R axilla  - drainage of L inframammary area  - scarring of R inframammary area  - thin eczematous patch of the L ankle with xerosis    All areas not commented on are within normal limits or unremarkable    (Approved Template 07/20/2020)

## 2022-09-06 NOTE — Unmapped (Signed)
See MyChart note.

## 2022-09-07 DIAGNOSIS — L732 Hidradenitis suppurativa: Principal | ICD-10-CM

## 2022-09-07 MED ORDER — HUMIRA(CF) PEN 80 MG/0.8 ML SUBCUTANEOUS KIT
SUBCUTANEOUS | 6 refills | 28.00000 days | Status: CP
Start: 2022-09-07 — End: 2023-03-22

## 2022-09-08 DIAGNOSIS — L732 Hidradenitis suppurativa: Principal | ICD-10-CM

## 2022-09-08 DIAGNOSIS — E559 Vitamin D deficiency, unspecified: Principal | ICD-10-CM

## 2022-09-08 MED ORDER — ERGOCALCIFEROL (VITAMIN D2) 1,250 MCG (50,000 UNIT) CAPSULE
ORAL_CAPSULE | ORAL | 2 refills | 28.00000 days | Status: CP
Start: 2022-09-08 — End: 2023-09-08

## 2022-09-08 NOTE — Unmapped (Signed)
Patient is requesting the following refill  Requested Prescriptions     Pending Prescriptions Disp Refills    ergocalciferol-1,250 mcg, 50,000 unit, (DRISDOL) 1,250 mcg (50,000 unit) capsule [Pharmacy Med Name: VITAMIN D2 50,000IU (ERGO) CAP RX] 4 capsule 2     Sig: Take 1 capsule (1,250 mcg total) by mouth once a week.       Recent Visits  Date Type Provider Dept   07/20/22 Office Visit Loran Senters, FNP [facility] [address] [facility]   06/06/22 Office Visit Loran Senters, FNP [facility] [address] [facility]   Showing recent visits within past 365 days with a meds authorizing provider and meeting all other requirements  Future Appointments  No visits were found meeting these conditions.  Showing future appointments within next 365 days with a meds authorizing provider and meeting all other requirements       Labs: Vitamin D:   Vitamin D Total (25OH) (ng/mL)   Date Value   07/20/2022 21.5

## 2022-09-15 NOTE — Unmapped (Signed)
[facility] Pharm[facility] Refill Coordination Note    Patient is aware that she is staying on 40 mg weekly dose until 80 mg weekly dose gets approved by insurance.    Specialty Medication(s) to be Shipped:   Inflammatory Disorders: Humira    Other medication(s) to be shipped: No additional medications requested for fill at this time     Heather Watts, DOB: 02-Mar-1971  Phone: [phone] (home)       All above HIPAA information was verified with patient.     Was a Nurse, learning disability used for this call? No    Completed refill call assessment today to schedule patient's medication shipment from the [facility] [phone]).  All relevant notes have been reviewed.     Specialty medication(s) and dose(s) confirmed: Regimen is correct and unchanged.   Changes to medications: [name] reports no changes at this time.  Changes to insurance: No  New side effects reported not previously addressed with a pharmacist or physician: None reported  Questions for the pharmacist: No    Confirmed patient received a Conservation officer, historic buildings and a Surveyor, mining with first shipment. The patient will receive a drug information handout for each medication shipped and additional FDA Medication Guides as required.       DISEASE/MEDICATION-SPECIFIC INFORMATION        For patients on injectable medications: Patient currently has 0 doses left.  Next injection is scheduled for 11/11.    SPECIALTY MEDICATION ADHERENCE     Medication Adherence    Patient reported X missed doses in the last month: 0  Specialty Medication: Humira Cf 40 mg/0.4 ml  Patient is on additional specialty medications: No  Any gaps in refill history greater than 2 weeks in the last 3 months: no  Demonstrates understanding of importance of adherence: yes  Informant: patient  Reliability of informant: reliable              Confirmed plan for next specialty medication refill: delivery by pharmacy  Refills needed for supportive medications: not needed Were doses missed due to medication being on hold? No     Humira Cf 40 mg/0.4 ml: 0 days on hand    REFERRAL TO PHARMACIST     Referral to the pharmacist: Not needed      [facility]     Shipping address confirmed in Epic.     Delivery Scheduled: Yes, Expected medication delivery date: 11/10.     Medication will be delivered via Same Day Courier to the prescription address in Epic WAM.    Valere Dross   [facility] Specialty Technician

## 2022-09-16 DIAGNOSIS — L732 Hidradenitis suppurativa: Principal | ICD-10-CM

## 2022-09-16 MED ORDER — HUMIRA(CF) PEN 80 MG/0.8 ML SUBCUTANEOUS KIT
SUBCUTANEOUS | 11 refills | 28.00000 days | Status: CP
Start: 2022-09-16 — End: 2023-09-16
  Filled 2022-10-26: qty 4, 28d supply, fill #0

## 2022-09-16 NOTE — Unmapped (Signed)
[name] 's HUMIRA(CF) PEN 40 mg/0.4 mL injection (adalimumab) shipment will be delayed as a result of the patient's insurance plan being in grace period (patient needs to pay insurance premium).     I have reached out to the patient  at [phone] and communicated the delay. We will call the patient back to reschedule the delivery upon resolution. We have not confirmed the new delivery date.

## 2022-09-20 DIAGNOSIS — L732 Hidradenitis suppurativa: Principal | ICD-10-CM

## 2022-09-20 MED ORDER — DOXYCYCLINE MONOHYDRATE 100 MG CAPSULE
ORAL_CAPSULE | 2 refills | 0 days | Status: CP
Start: 2022-09-20 — End: ?

## 2022-09-24 ENCOUNTER — Ambulatory Visit
Admit: 2022-09-24 | Discharge: 2022-09-24 | Disposition: A | Discharge status: Home / Self Care | Payer: PRIVATE HEALTH INSURANCE

## 2022-09-24 DIAGNOSIS — E876 Hypokalemia: Principal | ICD-10-CM

## 2022-09-24 DIAGNOSIS — R252 Cramp and spasm: Principal | ICD-10-CM

## 2022-09-24 LAB — CK: CREATINE KINASE TOTAL: 48 U/L

## 2022-09-24 LAB — COMPREHENSIVE METABOLIC PANEL
ALBUMIN: 3.9 g/dL (ref 3.4–5.0)
ALKALINE PHOSPHATASE: 65 U/L (ref 46–116)
ALT (SGPT): 20 U/L (ref 10–49)
ANION GAP: 6 mmol/L (ref 5–14)
AST (SGOT): 14 U/L (ref <=34)
BILIRUBIN TOTAL: 0.6 mg/dL (ref 0.3–1.2)
BLOOD UREA NITROGEN: 12 mg/dL (ref 9–23)
BUN / CREAT RATIO: 17
CALCIUM: 10.4 mg/dL (ref 8.7–10.4)
CHLORIDE: 103 mmol/L (ref 98–107)
CO2: 29.9 mmol/L (ref 20.0–31.0)
CREATININE: 0.71 mg/dL
EGFR CKD-EPI (2021) FEMALE: >90 mL/min/{1.73_m2} (ref >=60)
GLUCOSE RANDOM: 97 mg/dL (ref 70–179)
POTASSIUM: 3 mmol/L — ABNORMAL LOW (ref 3.4–4.8)
PROTEIN TOTAL: 8 g/dL (ref 5.7–8.2)
SODIUM: 139 mmol/L (ref 135–145)

## 2022-09-24 LAB — IONIZED CALCIUM VENOUS: CALCIUM IONIZED VENOUS (MG/DL): 5.09 mg/dL (ref 4.40–5.40)

## 2022-09-24 LAB — MAGNESIUM: MAGNESIUM: 1.9 mg/dL (ref 1.6–2.6)

## 2022-09-24 LAB — PARATHYROID HORMONE (PTH): PARATHYROID HORMONE INTACT: 82.5 pg/mL — ABNORMAL HIGH (ref 18.4–80.1)

## 2022-09-24 LAB — PHOSPHORUS: PHOSPHORUS: 3.2 mg/dL (ref 2.4–5.1)

## 2022-09-24 MED ADMIN — potassium chloride (KLOR-CON) packet 40 mEq: 40 meq | ORAL | @ 18:00:00 | Stop: 2022-09-24

## 2022-09-24 NOTE — Unmapped (Signed)
[facility]  Emergency Department Provider Note        History     Chief Complaint  Headache New Onset or New Symptoms      HPI   Heather Watts is a 51 y.o. female with a past medical history of hypokalemia, abnormal calcium levels who presents with 3 days of leg cramping and soreness.  This is better when she walks around but is worse when she lies still.  There was no injury or trauma.  She is eating and drinking well.  No numbness or tingling.  There is some aching in the arms but primarily in her legs.  Her biggest concern is for an electrolyte derangement as she has had this previously.  No chest pain or shortness of breath.      Triage mentioned a headache but she denies a headache today and has no concerns regarding this.       Past Medical History:   Diagnosis Date    COVID-19 11/06/2020    GERD (gastroesophageal reflux disease)     Hyperlipidemia     Hypertension     Obesity slightly       Patient Active Problem List   Diagnosis    Essential hypertension    Hyperlipidemia    Grouped skin lesions       No past surgical history on file.    No current facility-administered medications for this encounter.    Current Outpatient Medications:     adalimumab (HUMIRA,CF, PEN) 80 mg/0.8 mL PnKt, Inject the contents of 1 pen (80 mg total) under the skin once a week. Maintenance dose., Disp: 4 each, Rfl: 11    amLODIPine (NORVASC) 10 MG tablet, Take 1 tablet (10 mg total) by mouth daily. TAKE 1 TABLET(10 MG) BY MOUTH DAILY, Disp: 90 tablet, Rfl: 3    chlorthalidone (HYGROTON) 25 MG tablet, Take 1 tablet (25 mg total) by mouth daily. TAKE 1 TABLET(25 MG) BY MOUTH DAILY, Disp: 90 tablet, Rfl: 3    clobetasoL (TEMOVATE) 0.05 % ointment, Apply the medication twice daily to stubborn areas of the skin until smooth. Then stop and re-start as the skin changes come back., Disp: 60 g, Rfl: 5    cyclobenzaprine (FLEXERIL) 5 MG tablet, Take 1-2 tabs po tid prn, Disp: 60 tablet, Rfl: 1    doxycycline (MONODOX) 100 MG capsule, Take one capsule by mouth twice daily for 10 days., Disp: 20 capsule, Rfl: 2    empty container Misc, Use as directed to dispose of needles. When full, make sure lid is closed tightly then dispose of container in trash., Disp: 1 each, Rfl: 2    ergocalciferol-1,250 mcg, 50,000 unit, (DRISDOL) 1,250 mcg (50,000 unit) capsule, Take 1 capsule (1,250 mcg total) by mouth once a week., Disp: 4 capsule, Rfl: 2    HUMIRA PEN CITRATE FREE 40 MG/0.4 ML, Inject the contents of 1 pen (40 mg total) under the skin every seven (7) days., Disp: 4 each, Rfl: 3    magnesium oxide (MAG-OX) 400 mg (241.3 mg elemental magnesium) tablet, Take 1 tablet (400 mg total) by mouth daily., Disp: 90 tablet, Rfl: 1    potassium chloride 20 MEQ ER tablet, TAKE 2 TABLETS(40 MEQ) BY MOUTH DAILY (Patient taking differently: Take 1 tablet (20 mEq total) by mouth daily.), Disp: 120 tablet, Rfl: 2    valACYclovir (VALTREX) 500 MG tablet, Take 1 tablet (500 mg total) by mouth Two (2) times a day. For 3 days, Disp: 30  tablet, Rfl: 5    Allergies  Patient has no known allergies.    Family History   Problem Relation Age of Onset    Cancer Mother     Hypertension Mother         Everyone on mother's side of family    COPD Father     Hyperlipidemia Father     Hypertension Father     Cancer Maternal Aunt     Cancer Maternal Uncle     Early death Maternal Uncle         Heart Attack    Aneurysm Maternal Uncle     Stroke Maternal Grandmother     Melanoma Neg Hx     Basal cell carcinoma Neg Hx     Squamous cell carcinoma Neg Hx        Social History  Social History     Tobacco Use    Smoking status: Former     Packs/day: 0.50     Years: 14.00     Additional pack years: 0.00     Total pack years: 7.00     Types: Cigarettes     Quit date: 03/18/2005     Years since quitting: 17.5    Smokeless tobacco: Never    Tobacco comments:     Quit smoking 2005   Vaping Use    Vaping Use: Never used   Substance Use Topics    Alcohol use: Not Currently    Drug use: Never Review of Systems    A complete review of systems was performed and is negative other than as addressed in the HPI.    Physical Exam     ED Triage Vitals [09/24/22 0859]   Enc Vitals Group      BP 148/81      Heart Rate 96      SpO2 Pulse       Resp 16      Temp 36.9 ??C (98.4 ??F)      Temp Source Oral      SpO2 99 %      Weight (!) 102.1 kg (225 lb)      Height       Head Circumference       Peak Flow       Pain Score       Pain Loc       Pain Edu?       Excl. in GC?        Constitutional: Alert and oriented. Well appearing and in no distress.  Eyes: No scleral icterus.  ENT       Head: Normocephalic and atraumatic.       Mouth/Throat: Mucous membranes are moist.       Neck: Supple  Cardiovascular: Normal rate, extremities well perfused.  Respiratory: Normal respiratory effort. Good tidal volume.   Gastrointestinal: Soft and non-distended.   Genitourinary: Bladder is non-distended.   Musculoskeletal: Patient freely moves all extremities. No warm or swollen joints. No tenderness of thighs or calves.  Negative Homan.  Palpable distal pulses. No skin changes.   Neurologic: Normal speech and language. No gross focal neurologic deficits are appreciated.  Skin: Skin is warm, dry and intact. No rash noted.  Psychiatric: Mood and affect are normal. Speech and behavior are normal.      Radiology     No orders to display         Labs     Results for orders placed or performed  during the hospital encounter of 09/24/22   Comprehensive metabolic panel   Result Value Ref Range    Sodium 139 135 - 145 mmol/L    Potassium 3.0 (L) 3.4 - 4.8 mmol/L    Chloride 103 98 - 107 mmol/L    CO2 29.9 20.0 - 31.0 mmol/L    Anion Gap 6 5 - 14 mmol/L    BUN 12 9 - 23 mg/dL    Creatinine 2.95 6.21 - 1.02 mg/dL    BUN/Creatinine Ratio 17     eGFR CKD-EPI (2021) Female >90 >=60 mL/min/1.30m2    Glucose 97 70 - 179 mg/dL    Calcium 30.8 8.7 - 65.7 mg/dL    Albumin 3.9 3.4 - 5.0 g/dL    Total Protein 8.0 5.7 - 8.2 g/dL    Total Bilirubin 0.6 0.3 - 1.2 mg/dL    AST 14 <=84 U/L    ALT 20 10 - 49 U/L    Alkaline Phosphatase 65 46 - 116 U/L   Magnesium Level   Result Value Ref Range    Magnesium 1.9 1.6 - 2.6 mg/dL   Phosphorus Level   Result Value Ref Range    Phosphorus 3.2 2.4 - 5.1 mg/dL   Ionized Calcium, Venous   Result Value Ref Range    Calcium, Ionized Venous 5.09 4.40 - 5.40 mg/dL   Total CK (Creatine Kinase)   Result Value Ref Range    Creatine Kinase, Total 48.0 34.0 - 145.0 U/L   ECG 12 Lead   Result Value Ref Range    EKG Systolic BP  mmHg    EKG Diastolic BP  mmHg    EKG Ventricular Rate 84 BPM    EKG Atrial Rate 84 BPM    EKG P-R Interval 176 ms    EKG QRS Duration 82 ms    EKG Q-T Interval 372 ms    EKG QTC Calculation 439 ms    EKG Calculated P Axis 39 degrees    EKG Calculated R Axis 16 degrees    EKG Calculated T Axis 10 degrees    QTC Fredericia 416 ms       Pertinent labs & imaging results that were available during my care of the patient were reviewed by me and considered in my medical decision making (see chart for details).      ED Clinical Impression     1. Hypokalemia    2. Muscle cramping          ED Course, Assessment and Plan     This woman with 3 days of leg cramping and soreness presents to the ED concerned about her electrolytes.  Her exam is unremarkable and I have low suspicion for DVT or other vascular process.  Doubt neuropathic pain as well.      Labs drawn today show that her potassium is indeed low at 3.0 but without EKG changes.  Plan to administer oral replacement here today and discharge home to follow up with her PCP.  Could be low due to chlorthalidone use.  She agrees to follow up with her PCP to address this.      Medications   potassium chloride (KLOR-CON) packet 40 mEq (40 mEq Oral Given 09/24/22 1238)         Discharge Medications:  Discharge Medication List as of 09/24/2022 12:32 PM           ____________________________________________  Disposition: discharge home.    After careful consideration of Heather Watts presentation  and clinical course, there does not appear to be an indication for further emergent evaluation or intervention, nor is there an indication for admission to the hospital.  At the time of discharge I do not believe, to the best of my medical judgment, that an emergancy medical condition exists.  Heather Watts is discharged home in stable and satisfactory condition.  Discharge diagnosis, instructions and plan were discussed and understood.  Signs and symptoms that should prompt re-evaluation in the emergency department were discussed and understood.  Both verbal and written discharge instructions were provided.      Additional Medical Decision Making     I have reviewed the vital signs and the nursing notes. Labs and radiology results that were available during my care of the patient were independently reviewed by me and considered in my medical decision making.        I have reviewed the triage vital signs and the nursing notes.       Helyn App, [place]  09/24/22 [phone]

## 2022-09-24 NOTE — Unmapped (Signed)
Pt present here with R sided headache, feeling tired and pain to BLE  x 3 days. Denies any vision changes. Ambulatory to triage with steady gait. Neuro intact.

## 2022-09-27 DIAGNOSIS — R7989 Other specified abnormal findings of blood chemistry: Principal | ICD-10-CM

## 2022-09-27 DIAGNOSIS — E559 Vitamin D deficiency, unspecified: Principal | ICD-10-CM

## 2022-09-27 NOTE — Unmapped (Signed)
Fax received requesting PA for Humira 80mg /0.59ml, PA was initiated via [facility] Hildred Alamin Key: [facility] - Rx #: [id number]

## 2022-09-27 NOTE — Unmapped (Signed)
PA initiated for Humira Pen 80mg via [facility] Key: [id number]

## 2022-10-03 NOTE — Unmapped (Signed)
[name] 's HUMIRA(CF) PEN 40 mg/0.4 mL injection (adalimumab) shipment will be sent out  as a result of the patient is no longer in their grace period.     I have reached out to the patient  at [phone] and communicated the delivery change. We will reschedule the medication for the delivery date that the patient agreed upon.  We have confirmed the delivery date as 10/04/2022, via same day courier.

## 2022-10-04 MED FILL — HUMIRA PEN CITRATE FREE 40 MG/0.4 ML: SUBCUTANEOUS | 28 days supply | Qty: 4 | Fill #2

## 2022-10-06 NOTE — Unmapped (Signed)
PA Approval. Humira valid 01/12/2022-01/11/2023. Patient notified.

## 2022-10-21 NOTE — Unmapped (Signed)
[facility] Pharmacist has reviewed a new prescription for Humira that indicates a dose increase.  Patient was counseled on this dosage change by 10/31- see epic note from DD.  Next refill call date adjusted if necessary.    Heather Watts, PharmD, BCPS - Pharmacist   [facility]

## 2022-10-21 NOTE — Unmapped (Signed)
[facility] Refill Coordination Note    Specialty Medication(s) to be Shipped:   Inflammatory Disorders: Humira    Other medication(s) to be shipped: No additional medications requested for fill at this time     Heather Watts, DOB: 1971/05/04  Phone: [phone] (home)       All above HIPAA information was verified with patient.     Was a Nurse, learning disability used for this call? No    Completed refill call assessment today to schedule patient's medication shipment from the [facility] [phone]).  All relevant notes have been reviewed.     Specialty medication(s) and dose(s) confirmed: Regimen is correct and unchanged.   Changes to medications: Shawnna reports no changes at this time.  Changes to insurance: No  New side effects reported not previously addressed with a pharmacist or physician: None reported  Questions for the pharmacist: No    Confirmed patient received a Conservation officer, historic buildings and a Surveyor, mining with first shipment. The patient will receive a drug information handout for each medication shipped and additional FDA Medication Guides as required.       DISEASE/MEDICATION-SPECIFIC INFORMATION        For patients on injectable medications: Patient currently has 1 doses left.  Next injection is scheduled for 12/23.- patient reports an alternate schedule due to insurance, but will be back to every Saturday effective 12/23.    SPECIALTY MEDICATION ADHERENCE     Medication Adherence    Patient reported X missed doses in the last month: 0  Specialty Medication: humira cf 80mg   Patient is on additional specialty medications: No  Any gaps in refill history greater than 2 weeks in the last 3 months: no  Demonstrates understanding of importance of adherence: yes  Informant: patient  Reliability of informant: reliable              Confirmed plan for next specialty medication refill: delivery by pharmacy  Refills needed for supportive medications: not needed              Were doses missed due to medication being on hold? No    humira cf 80mg   : 8 days of medicine on hand       REFERRAL TO PHARMACIST     Referral to the pharmacist: Not needed      [facility]     Shipping address confirmed in Epic.     Delivery Scheduled: Yes, Expected medication delivery date: 12/20.     Medication will be delivered via Same Day Courier to the prescription address in Epic WAM.    Westley Gambles   [facility] Specialty Technician

## 2022-11-17 DIAGNOSIS — M62838 Other muscle spasm: Principal | ICD-10-CM

## 2022-11-17 MED ORDER — CYCLOBENZAPRINE 5 MG TABLET
ORAL_TABLET | 1 refills | 0 days | Status: CP
Start: 2022-11-17 — End: 2023-11-17

## 2022-11-17 NOTE — Unmapped (Signed)
[facility] Refill Coordination Note    Specialty Medication(s) to be Shipped:   Inflammatory Disorders: Humira    Other medication(s) to be shipped: No additional medications requested for fill at this time     Heather Watts, DOB: August 24, 1971  Phone: [phone] (home)       All above HIPAA information was verified with patient.     Was a Nurse, learning disability used for this call? No    Completed refill call assessment today to schedule patient's medication shipment from the [facility] [phone]).  All relevant notes have been reviewed.     Specialty medication(s) and dose(s) confirmed: Regimen is correct and unchanged.   Changes to medications: Heather Watts reports no changes at this time.  Changes to insurance: No  New side effects reported not previously addressed with a pharmacist or physician: None reported  Questions for the pharmacist: No    Confirmed patient received a Conservation officer, historic buildings and a Surveyor, mining with first shipment. The patient will receive a drug information handout for each medication shipped and additional FDA Medication Guides as required.       DISEASE/MEDICATION-SPECIFIC INFORMATION        For patients on injectable medications: Patient currently has 1 doses left.  Next injection is scheduled for 11/20/22.    SPECIALTY MEDICATION ADHERENCE     Medication Adherence    Patient reported X missed doses in the last month: 0  Specialty Medication: HUMIRA(CF) PEN 80 mg/0.8 mL )  Patient is on additional specialty medications: No                                Were doses missed due to medication being on hold? No    Humira 80/0.8 mg/ml: 2 days of medicine on hand        REFERRAL TO PHARMACIST     Referral to the pharmacist: Not needed      [facility]     Shipping address confirmed in Epic.     Delivery Scheduled: Yes, Expected medication delivery date: 11/24/22.     Medication will be delivered via Same Day Courier to the prescription address in Epic WAM.    Unk Lightning   [facility] Specialty Technician

## 2022-11-17 NOTE — Unmapped (Signed)
Patient is requesting the following refill  Requested Prescriptions     Pending Prescriptions Disp Refills    cyclobenzaprine (FLEXERIL) 5 MG tablet [Pharmacy Med Name: CYCLOBENZAPRINE 5MG  TABLETS] 60 tablet 1     Sig: TAKE 1 TO 2 TABLETS BY MOUTH THREE TIMES DAILY AS NEEDED       Recent Visits  Date Type Provider Dept   07/20/22 Office Visit Loran Senters, FNP [facility] [address] [facility]   06/06/22 Office Visit Loran Senters, FNP [facility] [address] [facility]   Showing recent visits within past 365 days with a meds authorizing provider and meeting all other requirements  Future Appointments  No visits were found meeting these conditions.  Showing future appointments within next 365 days with a meds authorizing provider and meeting all other requirements

## 2022-11-24 MED FILL — HUMIRA(CF) PEN 80 MG/0.8 ML SUBCUTANEOUS KIT: SUBCUTANEOUS | 28 days supply | Qty: 4 | Fill #1

## 2022-12-11 MED ORDER — POTASSIUM CHLORIDE ER 20 MEQ TABLET,EXTENDED RELEASE(PART/CRYST)
ORAL_TABLET | 2 refills | 0 days
Start: 2022-12-11 — End: ?

## 2022-12-12 MED ORDER — POTASSIUM CHLORIDE ER 20 MEQ TABLET,EXTENDED RELEASE(PART/CRYST)
ORAL_TABLET | 2 refills | 0 days | Status: CP
Start: 2022-12-12 — End: ?

## 2022-12-12 NOTE — Unmapped (Signed)
Refill request received for patient.      Medication Requested: potassium chloride  Last Office Visit: Visit date not found   Next Office Visit: Visit date not found  Last Prescriber: Tawanna Sat    Nurse refill requirements met? Yes  If not met, why:     Sent to: Pharmacy per protocol  If sent to provider, which provider?:

## 2022-12-18 DIAGNOSIS — E559 Vitamin D deficiency, unspecified: Principal | ICD-10-CM

## 2022-12-18 MED ORDER — ERGOCALCIFEROL (VITAMIN D2) 1,250 MCG (50,000 UNIT) CAPSULE
ORAL_CAPSULE | 2 refills | 0 days
Start: 2022-12-18 — End: ?

## 2022-12-19 MED ORDER — ERGOCALCIFEROL (VITAMIN D2) 1,250 MCG (50,000 UNIT) CAPSULE
ORAL_CAPSULE | ORAL | 2 refills | 28 days | Status: CP
Start: 2022-12-19 — End: 2023-12-19

## 2022-12-19 NOTE — Unmapped (Signed)
Patient is requesting the following refill  Requested Prescriptions     Pending Prescriptions Disp Refills    ergocalciferol-1,250 mcg, 50,000 unit, (DRISDOL) 1,250 mcg (50,000 unit) capsule [Pharmacy Med Name: VITAMIN D2 50,000IU (ERGO) CAP RX] 4 capsule 2     Sig: Take 1 capsule (1,250 mcg total) by mouth once a week.       Recent Visits  Date Type Provider Dept   07/20/22 Office Visit MacDonald, Keri Wolfe, FNP [facility] [address] [facility] [place]   06/06/22 Office Visit MacDonald, Keri Wolfe, FNP [facility] [address] [facility] [place]   Showing recent visits within past 365 days with a meds authorizing provider and meeting all other requirements  Future Appointments  No visits were found meeting these conditions.  Showing future appointments within next 365 days with a meds authorizing provider and meeting all other requirements       Labs: Vitamin D:   Vitamin D Total (25OH) (ng/mL)   Date Value   07/20/2022 21.5

## 2023-01-02 NOTE — Unmapped (Signed)
[facility] Refill Coordination Note    Specialty Medication(s) to be Shipped:   Inflammatory Disorders: Humira    Other medication(s) to be shipped: No additional medications requested for fill at this time     Heather Watts, DOB: 11-Jan-1971  Phone: [phone] (home)       All above HIPAA information was verified with patient.     Was a Nurse, learning disability used for this call? No    Completed refill call assessment today to schedule patient's medication shipment from the [facility] [phone]).  All relevant notes have been reviewed.     Specialty medication(s) and dose(s) confirmed: Regimen is correct and unchanged.   Changes to medications: [name] reports no changes at this time.  Changes to insurance: No  New side effects reported not previously addressed with a pharmacist or physician: None reported  Questions for the pharmacist: No    Confirmed patient received a Conservation officer, historic buildings and a Surveyor, mining with first shipment. The patient will receive a drug information handout for each medication shipped and additional FDA Medication Guides as required.       DISEASE/MEDICATION-SPECIFIC INFORMATION        For patients on injectable medications: Patient currently has 0 doses left.  Next injection is scheduled for 01/04/23.    SPECIALTY MEDICATION ADHERENCE     Medication Adherence    Patient reported X missed doses in the last month: 0  Specialty Medication: HUMIRA(CF) PEN 80 mg/0.8 mL Pnkt (adalimumab)  Patient is on additional specialty medications: No              Were doses missed due to medication being on hold? No        REFERRAL TO PHARMACIST     Referral to the pharmacist: Not needed      [facility]     Shipping address confirmed in Epic.     Patient was notified of new phone menu : No    Delivery Scheduled: Yes, Expected medication delivery date: 01/04/23.     Medication will be delivered via Same Day Courier to the prescription address in Epic WAM.    Quintella Reichert   [facility] Specialty Technician

## 2023-01-04 MED FILL — HUMIRA(CF) PEN 80 MG/0.8 ML SUBCUTANEOUS KIT: SUBCUTANEOUS | 28 days supply | Qty: 4 | Fill #2

## 2023-01-26 NOTE — Unmapped (Signed)
[facility] Refill Coordination Note    Specialty Medication(s) to be Shipped:   Inflammatory Disorders: Humira    Other medication(s) to be shipped: No additional medications requested for fill at this time     Heather Watts, DOB: Sep 09, 1971  Phone: [phone] (home)       All above HIPAA information was verified with patient.     Was a Nurse, learning disability used for this call? No    Completed refill call assessment today to schedule patient's medication shipment from the [facility] [phone]).  All relevant notes have been reviewed.     Specialty medication(s) and dose(s) confirmed: Regimen is correct and unchanged.   Changes to medications: [name] reports no changes at this time.  Changes to insurance: No  New side effects reported not previously addressed with a pharmacist or physician: Yes - Patient reports more congestion than usual. Patient would not like to speak to the pharmacist today. Their provider is not aware.  Questions for the pharmacist: No    Confirmed patient received a Conservation officer, historic buildings and a Surveyor, mining with first shipment. The patient will receive a drug information handout for each medication shipped and additional FDA Medication Guides as required.       DISEASE/MEDICATION-SPECIFIC INFORMATION        For patients on injectable medications: Patient currently has 1 doses left.  Next injection is scheduled for 01/26/23.    SPECIALTY MEDICATION ADHERENCE                Were doses missed due to medication being on hold? No        REFERRAL TO PHARMACIST     Referral to the pharmacist: Not needed      [facility]     Shipping address confirmed in Epic.     Patient was notified of new phone menu : Yes    Delivery Scheduled: Yes, Expected medication delivery date: 01/31/23.     Medication will be delivered via Same Day Courier to the prescription address in Epic WAM.    Alwyn Pea   [facility] Specialty Technician

## 2023-01-31 MED FILL — HUMIRA(CF) PEN 80 MG/0.8 ML SUBCUTANEOUS KIT: SUBCUTANEOUS | 28 days supply | Qty: 4 | Fill #3

## 2023-03-02 NOTE — Unmapped (Signed)
[facility] Refill Coordination Note    Specialty Medication(s) to be Shipped:   Inflammatory Disorders: Humira    Other medication(s) to be shipped: No additional medications requested for fill at this time     Rithika Wyss, DOB: [birth year]  Phone: [phone] (home)       All above HIPAA information was verified with patient.     Was a Nurse, learning disability used for this call? No    Completed refill call assessment today to schedule patient's medication shipment from the [facility] [phone]).  All relevant notes have been reviewed.     Specialty medication(s) and dose(s) confirmed: Regimen is correct and unchanged.   Changes to medications: Leenah reports no changes at this time.  Changes to insurance: No  New side effects reported not previously addressed with a pharmacist or physician: Yes - Patient reports more congestion and dry mouth than usual. Patient would not like to speak to the pharmacist today. Their provider is not aware. Patient would like a Pharmacist to follow-up with them tomorrow and will speak with Provider at next appointment in May.  Questions for the pharmacist: No    Confirmed patient received a Conservation officer, historic buildings and a Surveyor, mining with first shipment. The patient will receive a drug information handout for each medication shipped and additional FDA Medication Guides as required.       DISEASE/MEDICATION-SPECIFIC INFORMATION        For patients on injectable medications: Patient currently has 1 doses left.  Next injection is scheduled for 03/02/23.    SPECIALTY MEDICATION ADHERENCE     Medication Adherence    Specialty Medication: HUMIRA,CF, PEN) 80 mg/0.8 mL                Were doses missed due to medication being on hold? No        REFERRAL TO PHARMACIST     Referral to the pharmacist: Yes - patient request      SHIPPING     Shipping address confirmed in Epic.     Patient was notified of new phone menu : Yes    Delivery Scheduled: Yes, Expected medication delivery date: 03/03/23.     Medication will be delivered via Same Day Courier to the prescription address in Epic WAM.    Alwyn Pea   [facility] Specialty Technician

## 2023-03-02 NOTE — Unmapped (Signed)
4/25 - attempted to call patient back about side effect issue (dry mouth, congestion). Wouldn't expect either to be related to Humira but would want to talk through other new meds (antihistamines?). LM to return call if she wants to discuss further.     Jaran Sainz A. Katrinka Blazing, PharmD, BCPS - Pharmacist   [facility]

## 2023-03-03 MED FILL — HUMIRA(CF) PEN 80 MG/0.8 ML SUBCUTANEOUS KIT: SUBCUTANEOUS | 28 days supply | Qty: 4 | Fill #4

## 2023-03-16 DIAGNOSIS — L732 Hidradenitis suppurativa: Principal | ICD-10-CM

## 2023-03-16 MED ORDER — DOXYCYCLINE MONOHYDRATE 100 MG CAPSULE
ORAL_CAPSULE | 2 refills | 0 days | Status: CP
Start: 2023-03-16 — End: ?

## 2023-03-23 NOTE — Unmapped (Signed)
[facility] Clinical Assessment & Refill Coordination Note    Heather Watts, DOB: [birth year]  Phone: [phone] (home)     - pt says dry mouth/congestion concern from last month have resolved, doesn't think it was from Humira    All above HIPAA information was verified with patient.     Was a Nurse, learning disability used for this call? No    Specialty Medication(s):   Inflammatory Disorders: Humira     Current Outpatient Medications   Medication Sig Dispense Refill    adalimumab (HUMIRA,CF, PEN) 80 mg/0.8 mL PnKt Inject the contents of 1 pen (80 mg total) under the skin once a week. Maintenance dose. 4 each 11    amLODIPine (NORVASC) 10 MG tablet Take 1 tablet (10 mg total) by mouth daily. TAKE 1 TABLET(10 MG) BY MOUTH DAILY 90 tablet 3    chlorthalidone (HYGROTON) 25 MG tablet Take 1 tablet (25 mg total) by mouth daily. TAKE 1 TABLET(25 MG) BY MOUTH DAILY 90 tablet 3    clobetasoL (TEMOVATE) 0.05 % ointment Apply the medication twice daily to stubborn areas of the skin until smooth. Then stop and re-start as the skin changes come back. 60 g 5    cyclobenzaprine (FLEXERIL) 5 MG tablet TAKE 1 TO 2 TABLETS BY MOUTH THREE TIMES DAILY AS NEEDED 60 tablet 1    doxycycline (MONODOX) 100 MG capsule Take one capsule by mouth twice daily for 10 days. 20 capsule 2    empty container Misc Use as directed to dispose of needles. When full, make sure lid is closed tightly then dispose of container in trash. 1 each 2    ergocalciferol-1,250 mcg, 50,000 unit, (DRISDOL) 1,250 mcg (50,000 unit) capsule Take 1 capsule (1,250 mcg total) by mouth once a week. 4 capsule 2    magnesium oxide (MAG-OX) 400 mg (241.3 mg elemental magnesium) tablet Take 1 tablet (400 mg total) by mouth daily. 90 tablet 1    potassium chloride 20 MEQ ER tablet TAKE 2 TABLETS(40 MEQ) BY MOUTH DAILY 120 tablet 2    valACYclovir (VALTREX) 500 MG tablet Take 1 tablet (500 mg total) by mouth Two (2) times a day. For 3 days 30 tablet 5 No current facility-administered medications for this visit.        Changes to medications: [name] reports no changes at this time.    No Known Allergies    Changes to allergies: No    SPECIALTY MEDICATION ADHERENCE     Humira 80  mg/0.93ml : 14 days of medicine on hand       Medication Adherence    Patient reported X missed doses in the last month: 0  Specialty Medication: Humira 80mg  q week  Patient is on additional specialty medications: No  Patient is on more than two specialty medications: No  Informant: patient          Specialty medication(s) dose(s) confirmed: Regimen is correct and unchanged.     Are there any concerns with adherence? No    Adherence counseling provided? Not needed    CLINICAL MANAGEMENT AND INTERVENTION      Clinical Benefit Assessment:    Do you feel the medicine is effective or helping your condition? Yes    Clinical Benefit counseling provided? Not needed    Adverse Effects Assessment:    Are you experiencing any side effects? No    Are you experiencing difficulty administering your medicine? No    Quality of Life Assessment:  Quality of Life    Rheumatology  Oncology  Dermatology  1. What impact has your specialty medication had on the symptoms of your skin condition (i.e. itchiness, soreness, stinging)?: Some  2. What impact has your specialty medication had on your comfort level with your skin?: Some  Cystic Fibrosis          How many days over the past month did your HS  keep you from your normal activities? For example, brushing your teeth or getting up in the morning. Patient declined to answer    Have you discussed this with your provider? Not needed    Acute Infection Status:    Acute infections noted within Epic:  No active infections  Patient reported infection: None    Therapy Appropriateness:    Is therapy appropriate and patient progressing towards therapeutic goals? Yes, therapy is appropriate and should be continued    DISEASE/MEDICATION-SPECIFIC INFORMATION      For patients on injectable medications: Patient currently has 2 doses left.  Next injection is scheduled for 03/25/23.    Chronic Inflammatory Diseases: Have you experienced any flares in the last month? No  Has this been reported to your provider? Not applicable    PATIENT SPECIFIC NEEDS     Does the patient have any physical, cognitive, or cultural barriers? No    Is the patient high risk? No    Did the patient require a clinical intervention? No    Does the patient require physician intervention or other additional services (i.e., nutrition, smoking cessation, social work)? No    SOCIAL DETERMINANTS OF HEALTH     At the [facility], we have learned that life circumstances - like trouble affording food, housing, utilities, or transportation can affect the health of many of our patients.   That is why we wanted to ask: are you currently experiencing any life circumstances that are negatively impacting your health and/or quality of life? Patient declined to answer    Social Determinants of Health     Financial Resource Strain: Low Risk  (11/17/2021)    Overall Financial Resource Strain (CARDIA)     Difficulty of Paying Living Expenses: Not very hard   Internet Connectivity: No Internet connectivity concern identified (06/06/2022)    Internet Connectivity     Do you have access to internet services: Yes     How do you connect to the internet: Personal Device at home     Is your internet connection strong enough for you to watch video on your device without major problems?: Yes     Do you have enough data to get through the month?: Yes     Does at least one of the devices have a camera that you can use for video chat?: Yes   Food Insecurity: No Food Insecurity (11/17/2021)    Hunger Vital Sign     Worried About Running Out of Food in the Last Year: Never true     Ran Out of Food in the Last Year: Never true   Tobacco Use: Medium Risk (09/24/2022)    Patient History     Smoking Tobacco Use: Former     Smokeless Tobacco Use: Never     Passive Exposure: Not on file   Housing/Utilities: Low Risk  (11/17/2021)    Housing/Utilities     Within the past 12 months, have you ever stayed: outside, in a car, in a tent, in an overnight shelter, or temporarily in someone else's home (i.e.  couch-surfing)?: No     Are you worried about losing your housing?: No     Within the past 12 months, have you been unable to get utilities (heat, electricity) when it was really needed?: No   Alcohol Use: Not At Risk (07/20/2022)    Alcohol Use     How often do you have a drink containing alcohol?: Never     How many drinks containing alcohol do you have on a typical day when you are drinking?: Not on file     How often do you have 5 or more drinks on one occasion?: Never   Transportation Needs: No Transportation Needs (11/17/2021)    PRAPARE - Transportation     Lack of Transportation (Medical): No     Lack of Transportation (Non-Medical): No   Substance Use: Low Risk  (07/20/2022)    Substance Use     Taken prescription drugs for non-medical reasons: Never     Taken illegal drugs: Never     Patient indicated they have taken drugs in the past year for non-medical reasons: Yes, [positive answer(s)]: Not on file   Health Literacy: Not on file   Physical Activity: Not on file   Interpersonal Safety: Not on file   Stress: Not on file   Intimate Partner Violence: Not on file   Depression: Not at risk (12/16/2021)    PHQ-2     PHQ-2 Score: 0   Social Connections: Not on file       Would you be willing to receive help with any of the needs that you have identified today? Not applicable       SHIPPING     Specialty Medication(s) to be Shipped:   Inflammatory Disorders: Humira    Other medication(s) to be shipped: No additional medications requested for fill at this time     Changes to insurance: No    Delivery Scheduled: Yes, Expected medication delivery date: 03/29/23.     Medication will be delivered via Same Day Courier to the confirmed prescription address in [facility].    The patient will receive a drug information handout for each medication shipped and additional FDA Medication Guides as required.  Verified that patient has previously received a Conservation officer, historic buildings and a Surveyor, mining.    The patient or caregiver noted above participated in the development of this care plan and knows that they can request review of or adjustments to the care plan at any time.      All of the patient's questions and concerns have been addressed.    Darryl Nestle, PharmD   [facility] Specialty Pharmacist

## 2023-03-29 MED FILL — HUMIRA(CF) PEN 80 MG/0.8 ML SUBCUTANEOUS KIT: SUBCUTANEOUS | 28 days supply | Qty: 4 | Fill #5

## 2023-04-13 ENCOUNTER — Telehealth: Admit: 2023-04-13 | Discharge: 2023-04-14 | Payer: PRIVATE HEALTH INSURANCE

## 2023-04-13 DIAGNOSIS — R0982 Postnasal drip: Principal | ICD-10-CM

## 2023-04-13 NOTE — Unmapped (Signed)
Post nasal drip, mucus sensation in the throat, feels the ear is draining down into there throat, swollen lymph nodes on right side of neck.  No ear pain or fever.  Advised trial of flonase and zyrtec. Rest. Steam showers.   Follow up if symptoms persist or worsen.  Patient verbalizes understanding and has no further questions at this time.

## 2023-04-13 NOTE — Unmapped (Signed)
[facility] ENT Encounter  This medical encounter was conducted virtually using [email or web address]  [email or web address] protocols.    Patient ID: Heather Watts is a 52 y.o. female who presents by video interaction for evaluation.    I have identified myself to the patient and conveyed my credentials to [name].   Patient has signed informed consent on file in medical record.    Present on Video Call: Is there someone else in the room? No.    Assessment/Plan:      Problem List Items Addressed This Visit          Other    Post-nasal drip - Primary     Post nasal drip, mucus sensation in the throat, feels the ear is draining down into there throat, swollen lymph nodes on right side of neck.  No ear pain or fever.  Advised trial of flonase and zyrtec. Rest. Steam showers.   Follow up if symptoms persist or worsen.  Patient verbalizes understanding and has no further questions at this time.          -- Patient verbalized an understanding of today's assessment and recommendations, as well as the purpose of ongoing medications.    Follow-up with PCP    Medication adherence and barriers to the treatment plan have been addressed. Opportunities to optimize healthy behaviors have been discussed. Patient / caregiver voiced understanding.     Subjective:     HPI  Heather Watts is 52 y.o. and presents today in the [facility] with ENT symptoms.  The PCP for this patient is Loran Senters, FNP.     Started 3-4 days ago with symptoms.  Post nasal drip, mucus in throat.  When she lays down, she can feel drainage in the throat.  Swollen lymph nodes on the right side.  No pain in the ear, itches some.  No fever.    ROS  Review of Systems     All other ROS per HPI.    I have reviewed the problem list, past medical history, past family history, medications, and allergies and have updated/reconciled them if needed.     Objective:   Physical Exam  As part of this Video Visit, no in-person exam was conducted.  Video interaction permitted the following observations.    General: No acute distress.   HEENT: No visible mass or abnormality of neck.   RESP: Relaxed respiratory effort. No conversational dyspnea.   SKIN: No rashes noted.  NEURO: Normal coordination.  No tremors observed.  PSYCH: Alert and oriented.  Speech fluent and sensible.  Calm affect.       The patient reports they are physically located in [place] and is currently: at home. I conducted a audio/video visit. I spent  66m 26s on the video call with the patient. I spent an additional 3 minutes on pre- and post-visit activities on the date of service .

## 2023-04-19 NOTE — Unmapped (Signed)
..  Heather Watts has been contacted in regards to their refill of HUmira. At this time, they have declined refill due to patient having 3 doses remaining. Next injection is due this Saturday 6/15. Refill assessment call date has been updated per the patient's request.

## 2023-07-27 NOTE — Unmapped (Signed)
I called an additional time before disenrolling and reached Heather Watts. She has weaned off Humira (wanted to see how she'd do off therapy) but would like to resume at 80 mg every OTHER week. Will tag provider so they're aware.    We reviewed risk of antibody formation with starting/stopping biologics. We reviewed role of immune suppression for HS treatment. We reviewed potential familial/genetic link with HS.     [facility] Clinical Assessment & Refill Coordination Note    Heather Watts, DOB: Oct 04, 1971  Phone: [phone] (home)     All above HIPAA information was verified with patient.     Was a Nurse, learning disability used for this call? No    Specialty Medication(s):   Inflammatory Disorders: Humira     Current Outpatient Medications   Medication Sig Dispense Refill    adalimumab (HUMIRA,CF, PEN) 80 mg/0.8 mL PnKt Inject the contents of 1 pen (80 mg total) under the skin once a week. Maintenance dose. 4 each 11    amLODIPine (NORVASC) 10 MG tablet Take 1 tablet (10 mg total) by mouth daily. TAKE 1 TABLET(10 MG) BY MOUTH DAILY 90 tablet 3    chlorthalidone (HYGROTON) 25 MG tablet Take 1 tablet (25 mg total) by mouth daily. TAKE 1 TABLET(25 MG) BY MOUTH DAILY 90 tablet 3    clobetasoL (TEMOVATE) 0.05 % ointment Apply the medication twice daily to stubborn areas of the skin until smooth. Then stop and re-start as the skin changes come back. 60 g 5    cyclobenzaprine (FLEXERIL) 5 MG tablet TAKE 1 TO 2 TABLETS BY MOUTH THREE TIMES DAILY AS NEEDED 60 tablet 1    doxycycline (MONODOX) 100 MG capsule Take one capsule by mouth twice daily for 10 days. 20 capsule 2    empty container Misc Use as directed to dispose of needles. When full, make sure lid is closed tightly then dispose of container in trash. 1 each 2    ergocalciferol-1,250 mcg, 50,000 unit, (DRISDOL) 1,250 mcg (50,000 unit) capsule Take 1 capsule (1,250 mcg total) by mouth once a week. 4 capsule 2    magnesium oxide (MAG-OX) 400 mg (241.3 mg elemental magnesium) tablet Take 1 tablet (400 mg total) by mouth daily. 90 tablet 1    potassium chloride 20 MEQ ER tablet TAKE 2 TABLETS(40 MEQ) BY MOUTH DAILY 120 tablet 2     No current facility-administered medications for this visit.        Changes to medications: Heather Watts reports no changes at this time.    No Known Allergies    Changes to allergies: No    SPECIALTY MEDICATION ADHERENCE     Humira - 0 left    Medication Adherence    Specialty Medication: adalimumab (HUMIRA,CF, PEN) 80 mg/0.8 mL PnKt          Specialty medication(s) dose(s) confirmed: Regimen is correct and unchanged.     Are there any concerns with adherence? Yes: patient has been off therapy.    Adherence counseling provided? Yes: see above    CLINICAL MANAGEMENT AND INTERVENTION      Clinical Benefit Assessment:    Do you feel the medicine is effective or helping your condition? Yes    Clinical Benefit counseling provided? Not needed    Adverse Effects Assessment:    Are you experiencing any side effects? No    Are you experiencing difficulty administering your medicine? No    Quality of Life Assessment:    Quality of Life  Rheumatology  Oncology  Dermatology  Cystic Fibrosis          How many days over the past month did your HS  keep you from your normal activities? For example, brushing your teeth or getting up in the morning. Patient declined to answer    Have you discussed this with your provider? Not needed    Acute Infection Status:    Acute infections noted within Epic:  No active infections  Patient reported infection: None    Therapy Appropriateness:    Is therapy appropriate based on current medication list, adverse reactions, adherence, clinical benefit and progress toward achieving therapeutic goals? Yes, therapy is appropriate and should be continued     DISEASE/MEDICATION-SPECIFIC INFORMATION      For patients on injectable medications: Patient currently has 0 doses left.  Next injection is scheduled for ASAP (overdue).    Chronic Inflammatory Diseases: Have you experienced any flares in the last month? Yes, patient reports typical flaring, common with HS  Has this been reported to your provider? No    PATIENT SPECIFIC NEEDS     Does the patient have any physical, cognitive, or cultural barriers? No    Is the patient high risk? No    Did the patient require a clinical intervention? No    Does the patient require physician intervention or other additional services (i.e., nutrition, smoking cessation, social work)? No    SOCIAL DETERMINANTS OF HEALTH     At the [facility], we have learned that life circumstances - like trouble affording food, housing, utilities, or transportation can affect the health of many of our patients.   That is why we wanted to ask: are you currently experiencing any life circumstances that are negatively impacting your health and/or quality of life? Patient declined to answer    Social Determinants of Health     Financial Resource Strain: Low Risk  (11/17/2021)    Overall Financial Resource Strain (CARDIA)     Difficulty of Paying Living Expenses: Not very hard   Internet Connectivity: No Internet connectivity concern identified (06/06/2022)    Internet Connectivity     Do you have access to internet services: Yes     How do you connect to the internet: Personal Device at home     Is your internet connection strong enough for you to watch video on your device without major problems?: Yes     Do you have enough data to get through the month?: Yes     Does at least one of the devices have a camera that you can use for video chat?: Yes   Food Insecurity: No Food Insecurity (11/17/2021)    Hunger Vital Sign     Worried About Running Out of Food in the Last Year: Never true     Ran Out of Food in the Last Year: Never true   Tobacco Use: Medium Risk (04/13/2023)    Patient History     Smoking Tobacco Use: Former     Smokeless Tobacco Use: Never     Passive Exposure: Not on file   Housing/Utilities: Low Risk  (11/17/2021)    Housing/Utilities     Within the past 12 months, have you ever stayed: outside, in a car, in a tent, in an overnight shelter, or temporarily in someone else's home (i.e. couch-surfing)?: No     Are you worried about losing your housing?: No     Within the past 12 months, have you been  unable to get utilities (heat, electricity) when it was really needed?: No   Alcohol Use: Not At Risk (07/20/2022)    Alcohol Use     How often do you have a drink containing alcohol?: Never     How many drinks containing alcohol do you have on a typical day when you are drinking?: Not on file     How often do you have 5 or more drinks on one occasion?: Never   Transportation Needs: No Transportation Needs (11/17/2021)    PRAPARE - Transportation     Lack of Transportation (Medical): No     Lack of Transportation (Non-Medical): No   Substance Use: Low Risk  (07/20/2022)    Substance Use     Taken prescription drugs for non-medical reasons: Never     Taken illegal drugs: Never     Patient indicated they have taken drugs in the past year for non-medical reasons: Yes, [positive answer(s)]: Not on file   Health Literacy: Not on file   Physical Activity: Not on file   Interpersonal Safety: Unknown (07/27/2023)    Interpersonal Safety     Unsafe Where You Currently Live: Not on file     Physically Hurt by Anyone: Not on file     Abused by Anyone: Not on file   Stress: Not on file   Intimate Partner Violence: Not on file   Depression: Not at risk (12/16/2021)    PHQ-2     PHQ-2 Score: 0   Social Connections: Not on file       Would you be willing to receive help with any of the needs that you have identified today? Not applicable       SHIPPING     Specialty Medication(s) to be Shipped:   Inflammatory Disorders: Humira    Other medication(s) to be shipped: No additional medications requested for fill at this time     Changes to insurance: No    Delivery Scheduled: Yes, Expected medication delivery date: 9/24.     Medication will be delivered via Same Day Courier to the confirmed prescription address in [facility].    The patient will receive a drug information handout for each medication shipped and additional FDA Medication Guides as required.  Verified that patient has previously received a Conservation officer, historic buildings and a Surveyor, mining.    The patient or caregiver noted above participated in the development of this care plan and knows that they can request review of or adjustments to the care plan at any time.      All of the patient's questions and concerns have been addressed.    Shantele Reller [facility] Specialty Pharmacist

## 2023-07-31 NOTE — Unmapped (Signed)
Called, left voicemail    Thanks,  [employer]

## 2023-08-01 MED FILL — HUMIRA(CF) PEN 80 MG/0.8 ML SUBCUTANEOUS KIT: SUBCUTANEOUS | 28 days supply | Qty: 4 | Fill #6

## 2023-09-22 NOTE — Unmapped (Signed)
Specialty Medication(s): Humira    Ms.Lewellyn has been dis-enrolled from the [facility] specialty pharmacy services due to  no active rx/2 call attempts unanswered/patient likely needs to be seen. .    Additional information provided to the patient: Our Rx is now expired - patient likely needs to be seen (several recent cancelled appointments, last seen > 1 year ago and last note indicates followup was due ~ 03/07/23). I explained in VM we'd take off call list for now and I provided clinic's phone number if she wished to continue therapy - [phone]. VM left by pharmacy on 11/7 and 11/15.     Beverlyann Broxterman [facility] Specialty Pharmacist

## 2023-10-11 ENCOUNTER — Telehealth: Admit: 2023-10-11 | Discharge: 2023-10-12 | Payer: BLUE CROSS/BLUE SHIELD

## 2023-10-11 DIAGNOSIS — N611 Abscess of the breast and nipple: Principal | ICD-10-CM

## 2023-10-11 DIAGNOSIS — E559 Vitamin D deficiency, unspecified: Principal | ICD-10-CM

## 2023-10-11 MED ORDER — ERGOCALCIFEROL (VITAMIN D2) 1,250 MCG (50,000 UNIT) CAPSULE
ORAL_CAPSULE | ORAL | 2 refills | 28 days | Status: CP
Start: 2023-10-11 — End: ?

## 2023-10-11 MED ORDER — SULFAMETHOXAZOLE 800 MG-TRIMETHOPRIM 160 MG TABLET
ORAL_TABLET | Freq: Two times a day (BID) | ORAL | 0 refills | 7 days | Status: CP
Start: 2023-10-11 — End: 2023-10-18

## 2023-10-23 ENCOUNTER — Ambulatory Visit: Admit: 2023-10-23 | Payer: BLUE CROSS/BLUE SHIELD

## 2023-10-25 ENCOUNTER — Telehealth: Admit: 2023-10-25 | Discharge: 2023-10-26 | Payer: BLUE CROSS/BLUE SHIELD

## 2023-10-25 DIAGNOSIS — L732 Hidradenitis suppurativa: Principal | ICD-10-CM

## 2023-10-25 MED ORDER — PREDNISONE 20 MG TABLET
ORAL_TABLET | Freq: Every day | ORAL | 0 refills | 5.00 days | Status: CP
Start: 2023-10-25 — End: ?

## 2023-10-25 MED ORDER — CLINDAMYCIN 1 % LOTION
Freq: Two times a day (BID) | TOPICAL | 3 refills | 0.00 days | Status: CP
Start: 2023-10-25 — End: 2024-10-24

## 2023-10-30 ENCOUNTER — Ambulatory Visit
Admission: EM | Admit: 2023-10-30 | Discharge: 2023-10-30 | Disposition: A | Discharge status: Home / Self Care | Payer: BLUE CROSS/BLUE SHIELD

## 2023-10-30 DIAGNOSIS — L02211 Cutaneous abscess of abdominal wall: Secondary | ICD-10-CM

## 2023-10-30 DIAGNOSIS — L732 Hidradenitis suppurativa: Secondary | ICD-10-CM | POA: Diagnosis not present

## 2023-10-30 HISTORY — DX: Hidradenitis suppurativa: L73.2

## 2023-10-30 MED ORDER — DOXYCYCLINE HYCLATE 100 MG PO CAPS: 100.0000 mg | ORAL_CAPSULE | Freq: Two times a day (BID) | ORAL | 0 refills | Status: AC

## 2023-10-30 NOTE — ED Provider Notes (Signed)
Renaldo Fiddler    CSN: [id number] Arrival date & time: 10/30/23  1053      History   Chief Complaint Chief Complaint  Patient presents with   Abscess    HPI Alice Stephens is a 52 y.o. female.  Patient presents with 6-week history of abscess on her upper abdominal wall just below her right breast.  The area has a small hole and has been intermittently draining.  The drainage is purulent.  No fever.  Patient had a telemedicine visit with [facility] on 10/11/2023; diagnosed with abscess of breast; treated with Bactrim.  She had a another telehealth visit with [facility] on 10/25/2023; diagnosed with hidradenitis suppurativa; treated with prednisone and clindamycin lotion.  The history is provided by the patient and medical records.    Past Medical History:  Diagnosis Date   GERD (gastroesophageal reflux disease)    Hidradenitis suppurativa    Hyperlipemia    Hypertension     There are no active problems to display for this patient.   History reviewed. No pertinent surgical history.  OB History   No obstetric history on file.      Home Medications    Prior to Admission medications   Medication Sig Start Date End Date Taking? Authorizing Provider  Adalimumab (HUMIRA, 2 PEN,) 80 MG/0.8ML PNKT Inject into the skin. Patient not taking: Reported on 10/30/2023 07/31/23   [provider]  doxycycline (VIBRAMYCIN) 100 MG capsule Take 1 capsule (100 mg total) by mouth 2 (two) times daily for 7 days. 10/30/23 11/06/23 Yes Mickie Bail, NP  predniSONE (DELTASONE) 20 MG tablet Take 1 tablet by mouth daily. 10/25/23  Yes [provider]  Vitamin D, Ergocalciferol, (DRISDOL) 1.25 MG (50000 UNIT) CAPS capsule Take 50,000 Units by mouth once a week. 10/11/23  Yes [provider]  amLODipine (NORVASC) 10 MG tablet Take 10 mg by mouth daily.    [provider]  chlorthalidone (HYGROTON) 25 MG tablet Take 25 mg by mouth daily.    [provider]   metoprolol succinate (TOPROL-XL) 25 MG 24 hr tablet Take 25 mg by mouth daily. Patient not taking: Reported on 10/30/2023    [provider]  naproxen (NAPROSYN) 500 MG tablet Take 1 tablet (500 mg total) by mouth 2 (two) times daily with a meal. 08/25/17   Tommi Rumps, PA-C  omeprazole (PRILOSEC) 40 MG capsule Take 40 mg by mouth daily as needed (for acid reflux).  Patient not taking: Reported on 10/30/2023    [provider]  potassium chloride SA (KLOR-CON M) 20 MEQ tablet Take 20 mEq by mouth 2 (two) times daily.    [provider]    Family History History reviewed. No pertinent family history.  Social History Social History   Tobacco Use   Smoking status: Never  Substance Use Topics   Alcohol use: No   Drug use: Never     Allergies   Patient has no known allergies.   Review of Systems Review of Systems  Constitutional:  Negative for chills and fever.  Respiratory:  Negative for cough and shortness of breath.   Cardiovascular:  Negative for chest pain and palpitations.  Gastrointestinal:  Negative for abdominal pain and nausea.  Skin:  Positive for wound. Negative for color change.     Physical Exam Triage Vital Signs ED Triage Vitals  Encounter Vitals Group     BP 10/30/23 1201 (!) 148/85     Systolic BP Percentile --  Diastolic BP Percentile --      Pulse Rate 10/30/23 1156 100     Resp 10/30/23 1156 18     Temp 10/30/23 1156 98.6 F (37 C)     Temp src --      SpO2 10/30/23 1156 98 %     Weight --      Height --      Head Circumference --      Peak Flow --      Pain Score 10/30/23 1158 7     Pain Loc --      Pain Education --      Exclude from Growth Chart --    No data found.  Updated Vital Signs BP (!) 148/85   Pulse 100   Temp 98.6 F (37 C)   Resp 18   SpO2 98%   Visual Acuity Right Eye Distance:   Left Eye Distance:   Bilateral Distance:    Right Eye Near:   Left Eye Near:    Bilateral  Near:     Physical Exam Constitutional:      General: She is not in acute distress. HENT:     Mouth/Throat:     Mouth: Mucous membranes are moist.  Cardiovascular:     Rate and Rhythm: Normal rate and regular rhythm.  Pulmonary:     Effort: Pulmonary effort is normal. No respiratory distress.  Skin:    General: Skin is warm and dry.     Findings: Lesion present.     Comments: 6 cm x 5 cm abscess on right upper abdominal wall; Two small raised areas with one having a small opening with some purulent drainage.   Neurological:     Mental Status: She is alert.      UC Treatments / Results  Labs (all labs ordered are listed, but only abnormal results are displayed) Labs Reviewed - No data to display  EKG   Radiology No results found.  Procedures Incision and Drainage  Date/Time: 10/30/2023 12:34 PM  Performed by: Mickie Bail, NP Authorized by: Mickie Bail, NP   Consent:    Consent obtained:  Verbal   Consent given by:  Patient   Risks discussed:  Bleeding, incomplete drainage, infection and pain Universal protocol:    Procedure explained and questions answered to patient or proxy's satisfaction: yes   Location:    Type:  Abscess   Size:  6 cm x 5 cm   Location:  Trunk   Trunk location:  Abdomen Pre-procedure details:    Skin preparation:  Povidone-iodine Anesthesia:    Anesthesia method:  Local infiltration   Local anesthetic:  Lidocaine 1% WITH epi Procedure type:    Complexity:  Simple Procedure details:    Incision types:  Single straight (two incusions made at two raised areas)   Drainage:  Purulent   Drainage amount:  Copious   Wound treatment:  Wound left open   Packing materials:  None Post-procedure details:    Procedure completion:  Tolerated well, no immediate complications  (including critical care time)  Medications Ordered in UC Medications - No data to display  Initial Impression / Assessment and Plan / UC Course  I have reviewed  the triage vital signs and the nursing notes.  Pertinent labs & imaging results that were available during my care of the patient were reviewed by me and considered in my medical decision making (see chart for details).    Cutaneous abscess of abdominal  wall, hidradenitis suppurativa.  I&D performed with copious purulent drainage.  Treating today with doxycycline.  Instructed patient to schedule an appointment with a dermatologist as soon as possible.  Education provided on abscess and hidradenitis suppurativa.  She agrees to plan of care.  Final Clinical Impressions(s) / UC Diagnoses   Final diagnoses:  Cutaneous abscess of abdominal wall  Hidradenitis suppurativa     Discharge Instructions      Take the doxycycline as directed.  Follow-up with your primary care provider or a dermatologist.     ED Prescriptions     Medication Sig Dispense Auth. Provider   doxycycline (VIBRAMYCIN) 100 MG capsule Take 1 capsule (100 mg total) by mouth 2 (two) times daily for 7 days. 14 capsule Mickie Bail, NP      PDMP not reviewed this encounter.   Mickie Bail, NP 10/30/23 [phone]

## 2023-10-30 NOTE — ED Triage Notes (Signed)
Patient to Urgent Care with complaints of abscess present under her right breast. Reports the abscess sits under her bra strap.   Symptoms started six weeks ago. Reports it has been slowly draining but is getting more irritated.  Hx of HS.

## 2023-10-30 NOTE — Discharge Instructions (Addendum)
Take the doxycycline as directed.  Follow-up with your primary care provider or a dermatologist.

## 2023-11-04 MED ORDER — EMPTY CONTAINER
2 refills | 0.00 days
Start: 2023-11-04 — End: ?

## 2023-11-05 MED ORDER — EMPTY CONTAINER
2 refills | 0.00 days | Status: CP
Start: 2023-11-05 — End: ?

## 2023-11-11 DIAGNOSIS — I10 Essential (primary) hypertension: Principal | ICD-10-CM

## 2023-11-11 MED ORDER — AMLODIPINE 10 MG TABLET
ORAL_TABLET | Freq: Every day | ORAL | 3 refills | 0.00 days
Start: 2023-11-11 — End: ?

## 2023-11-11 MED ORDER — CHLORTHALIDONE 25 MG TABLET
ORAL_TABLET | Freq: Every day | ORAL | 3 refills | 0.00 days
Start: 2023-11-11 — End: ?

## 2023-11-13 MED ORDER — CHLORTHALIDONE 25 MG TABLET
ORAL_TABLET | Freq: Every day | ORAL | 0 refills | 90.00 days | Status: CP
Start: 2023-11-13 — End: 2024-11-11

## 2023-11-13 MED ORDER — AMLODIPINE 10 MG TABLET
ORAL_TABLET | Freq: Every day | ORAL | 0 refills | 90 days | Status: CP
Start: 2023-11-13 — End: 2024-11-11

## 2023-11-29 ENCOUNTER — Encounter: Admit: 2023-11-29 | Discharge: 2023-11-30 | Payer: BLUE CROSS/BLUE SHIELD

## 2023-11-29 DIAGNOSIS — L732 Hidradenitis suppurativa: Principal | ICD-10-CM

## 2023-11-29 MED ORDER — IBUPROFEN 800 MG TABLET
ORAL_TABLET | Freq: Three times a day (TID) | ORAL | 2 refills | 20 days | Status: CP | PRN
Start: 2023-11-29 — End: 2024-11-28

## 2023-11-29 MED ORDER — CLINDAMYCIN 1 % LOTION
Freq: Two times a day (BID) | TOPICAL | 3 refills | 0.00 days | Status: CP
Start: 2023-11-29 — End: 2024-11-28

## 2023-11-29 MED ORDER — METHYLPREDNISOLONE 4 MG TABLETS IN A DOSE PACK
Freq: Every day | ORAL | 0 refills | 1.00 days | Status: CP
Start: 2023-11-29 — End: 2024-11-28

## 2023-12-12 ENCOUNTER — Ambulatory Visit: Admit: 2023-12-12 | Discharge: 2023-12-12 | Payer: BLUE CROSS/BLUE SHIELD

## 2023-12-12 ENCOUNTER — Ambulatory Visit: Admit: 2023-12-12 | Discharge: 2023-12-12 | Payer: BLUE CROSS/BLUE SHIELD | Attending: Family | Primary: Family

## 2023-12-12 DIAGNOSIS — Z79899 Other long term (current) drug therapy: Principal | ICD-10-CM

## 2023-12-12 DIAGNOSIS — L732 Hidradenitis suppurativa: Principal | ICD-10-CM

## 2023-12-12 MED ORDER — BIMEKIZUMAB-BKZX 320 MG/2 ML SUBCUTANEOUS AUTO-INJECTOR
SUBCUTANEOUS | 0 refills | 0.00 days | Status: CP
Start: 2023-12-12 — End: 2024-04-03

## 2023-12-15 DIAGNOSIS — L732 Hidradenitis suppurativa: Principal | ICD-10-CM

## 2023-12-18 DIAGNOSIS — L732 Hidradenitis suppurativa: Principal | ICD-10-CM

## 2023-12-18 MED ORDER — DOXYCYCLINE MONOHYDRATE 100 MG CAPSULE
ORAL_CAPSULE | 2 refills | 0.00 days | Status: CP
Start: 2023-12-18 — End: ?

## 2023-12-20 ENCOUNTER — Encounter: Admit: 2023-12-20 | Discharge: 2023-12-21 | Payer: BLUE CROSS/BLUE SHIELD

## 2023-12-20 DIAGNOSIS — L732 Hidradenitis suppurativa: Principal | ICD-10-CM

## 2023-12-20 MED ORDER — SILVER SULFADIAZINE 1 % TOPICAL CREAM
1 refills | 0.00 days | Status: CP
Start: 2023-12-20 — End: 2024-12-19

## 2023-12-26 DIAGNOSIS — L732 Hidradenitis suppurativa: Principal | ICD-10-CM

## 2023-12-26 MED ORDER — COSENTYX UNOREADY PEN 300 MG/2 ML (150 MG/ML) SUBCUTANEOUS
SUBCUTANEOUS | 11 refills | 0.00 days | Status: CP
Start: 2023-12-26 — End: 2024-12-25
  Filled 2024-01-08: qty 8, 28d supply, fill #0

## 2023-12-29 DIAGNOSIS — L732 Hidradenitis suppurativa: Principal | ICD-10-CM

## 2024-01-04 NOTE — Unmapped (Signed)
 [facility] Specialty Medication Onboarding    Specialty Medication: Cosentyx  Prior Authorization: Approved   Financial Assistance: Yes - copay card approved as secondary   Final Copay/Day Supply: $919.75 / 28 days (LD & MD)    Insurance Restrictions: Yes - max 1 month supply     Notes to Pharmacist: AUTHORIZED CALL [phone] FOR PAYMENT  Credit Card on File: not applicable  Start Date on Rx:      The triage team has completed the benefits investigation and has determined that the patient is able to fill this medication at [facility]. Please contact the patient to complete the onboarding or follow up with the prescribing physician as needed.

## 2024-01-05 NOTE — Unmapped (Signed)
 [facility]    Patient Onboarding/Medication Counseling    Heather Watts is a 53 y.o. female with hidradenitis suppurativa who I am counseling today on initiation of therapy.  I am speaking to the patient.    Was a Nurse, learning disability used for this call? No    Verified patient's date of birth / HIPAA.    Specialty medication(s) to be sent: Inflammatory Disorders: Cosentyx      Non-specialty medications/supplies to be sent: sharps kit      Medications not needed at this time: na         Cosentyx (secukinumab)    Medication & Administration     Dosage: Hidradenitis Suppurativa: Inject 300mg  under the skin at weeks 0, 1, 2, 3, and 4 followed by 300 mg every 4 weeks; consider an increase to 300 mg every 2 weeks in patients who have an inadequate response      Lab tests required prior to treatment initiation:  Tuberculosis: Tuberculosis screening resulted in a non-reactive Quantiferon TB Gold assay.      Administration:     Prefilled Unoready?? auto-injector pen  Gather all supplies needed for injection on a clean, flat working surface: medication pen removed from packaging, alcohol swab, sharps container, etc.  Look at the medication label - look for correct medication, correct dose, and check the expiration date  Look at the medication - the liquid visible in the window on the side of the pen device should appear clear and colorless to slightly yellow  Lay the auto-injector pen on a flat surface and allow it to warm up to room temperature for at least  30-45 minutes  Select injection site - you can use the front of your thigh or your belly (but not the area 2 inches around your belly button); if someone else is giving you the injection you can also use your upper arm in the skin covering your triceps muscle  Prepare injection site - wash your hands and clean the skin at the injection site with an alcohol swab and let it air dry, do not touch the injection site again before the injection  Pull off the purple safety cap in the direction of the arrow, do not remove until immediately prior to injection and do not touch the red needle cover  Put the red needle cover against your skin at the injection site at a 90 degree angle, hold the pen such that you can see the clear medication window  Press down and hold the pen firmly against your skin, there will be a click when the injection starts  Continue to hold the pen firmly against your skin for about 10-15 seconds - the window will start to turn solid green  There will be a second click sound when the injection is almost complete, verify the window is solid green to indicate the injection is complete and then pull the pen away from your skin  Dispose of the used auto-injector pen immediately in your sharps disposal container the needle will be covered automatically  If you see any blood at the injection site, press a cotton ball or gauze on the site and maintain pressure until the bleeding stops, do not rub the injection site      Adherence/Missed dose instructions:  If your injection is given more than 4 days after your scheduled injection date - consult your pharmacist for additional instructions on how to adjust your dosing schedule.  Goals of Therapy     Ankylosing spondylitis  Relief of symptoms  Maintenance of function  Prevention of complications of spinal disease  Minimization of extraspinal and extraarticular manifestations and comorbidities  Maintenance of effective psychosocial functioning    Plaque Psoriasis  Minimize areas of skin involvement (% BSA)  Avoidance of long term glucocorticoid use  Maintenance of effective psychosocial functioning    Hidradenitis Suppurativa  Reduce the frequency and severity of new lesions  Minimize pain and suppuration  Prevent disease progression and limit scarring  Maintenance of effective psychosocial functioning    Psoriatic arthritis  Achieve remission/inactive disease or low/minimal disease activity  Maintenance of function  Minimization of systemic manifestations and comorbidities  Maintenance of effective psychosocial functioning      Side Effects & Monitoring Parameters     Injection site reaction (redness, irritation, inflammation localized to the site of administration)  Signs of a common cold - minor sore throat, runny or stuffy nose, etc.  Diarrhea    The following side effects should be reported to the provider:  Signs of a hypersensitivity reaction - rash; hives; itching; red, swollen, blistered, or peeling skin; wheezing; tightness in the chest or throat; difficulty breathing, swallowing, or talking; swelling of the mouth, face, lips, tongue, or throat; etc.  Reduced immune function - report signs of infection such as fever; chills; body aches; very bad sore throat; ear or sinus pain; cough; more sputum or change in color of sputum; pain with passing urine; wound that will not heal, etc.  Also at a slightly higher risk of some malignancies (mainly skin and blood cancers) due to this reduced immune function.  In the case of signs of infection - the patient should hold the next dose of Cosentyx?? and call your primary care provider to ensure adequate medical care.  Treatment may be resumed when infection is treated and patient is asymptomatic.  Muscle pain or weakness  Shortness of breath      Warnings, Precautions, & Contraindications     Have your bloodwork checked as you have been told by your prescriber  Talk with your doctor if you are pregnant, planning to become pregnant, or breastfeeding  Discuss the possible need for holding your dose(s) of Cosentyx?? when a planned procedure is scheduled with the prescriber as it may delay healing/recovery timeline       Drug/Food Interactions     Medication list reviewed in Epic. The patient was instructed to inform the care team before taking any new medications or supplements. No drug interactions identified.   If you have a latex allergy use caution when handling, the needle cap of the Cosentyx?? prefilled syringe and the safety cap for the Cosentyx Sensoready?? pen contains a derivative of natural rubber latex. Unoready?? pen does NOT contain latex.   Talk with you prescriber or pharmacist before receiving any live vaccinations while taking this medication and after you stop taking it      Storage, Handling Precautions, & Disposal     Store this medication in the refrigerator.  Do not freeze  May store intact Sensoready pens and 150 mg/mL prefilled syringes at <=30??C (<=86??F) for up to 4 days; may return to the refrigerator if unused  Store in original packaging, protected from light  Do not shake  Dispose of used syringes/pens in a sharps disposal container           Current Medications (including OTC/herbals), Comorbidities and Allergies     Current Outpatient Medications  Medication Sig Dispense Refill    amlodipine (NORVASC) 10 MG tablet Take 1 tablet (10 mg total) by mouth daily. TAKE 1 TABLET(10 MG) BY MOUTH DAILY 90 tablet 0    bimekizumab-bkzx 320 mg/2 mL AtIn Inject the contents of 1 auto-injector (320 mg) under the skin every twenty-eight (28) days for 5 doses. LOADING DOSES 10 mL 0    bimekizumab-bkzx 320 mg/2 mL AtIn Inject the contents of 1 auto-injector (320 mg) under the skin every 8 weeks. MAINTENANCE DOSE 2 mL 4    chlorthalidone (HYGROTON) 25 MG tablet Take 1 tablet (25 mg total) by mouth daily. TAKE 1 TABLET(25 MG) BY MOUTH DAILY 90 tablet 0    clindamycin (CLEOCIN T) 1 % lotion Apply topically two (2) times a day. 60 mL 3    clobetasoL (TEMOVATE) 0.05 % ointment Apply the medication twice daily to stubborn areas of the skin until smooth. Then stop and re-start as the skin changes come back. 60 g 5    doxycycline (MONODOX) 100 MG capsule Take one capsule by mouth twice daily for 10 days. 20 capsule 2    empty container Misc Use as directed to dispose of needles. When full, make sure lid is closed tightly then dispose of container in trash. 1 each 2 ergocalciferol-1,250 mcg, 50,000 unit, (DRISDOL) 1,250 mcg (50,000 unit) capsule Take 1 capsule (1,250 mcg total) by mouth once a week. 4 capsule 2    ibuprofen (MOTRIN) 800 MG tablet Take 1 tablet (800 mg total) by mouth every eight (8) hours as needed for pain. 60 tablet 2    methylPREDNISolone (MEDROL DOSEPACK) 4 mg tablet Take 1 tablet (4 mg total) by mouth daily. follow package directions 1 each 0    potassium chloride 20 MEQ ER tablet TAKE 2 TABLETS(40 MEQ) BY MOUTH DAILY 120 tablet 2    potassium chloride 20 MEQ ER tablet Take 1 tablet (20 mEq total) by mouth.      secukinumab (COSENTYX UNOREADY PEN) 300 mg/2 mL PnIj Inject the contents of 1 pen (300mg ) under the skin weekly for 5 weeks as loading doses. (on weeks 0,1,2,3,4 as loading doses) 10 mL 0    secukinumab (COSENTYX UNOREADY PEN) 300 mg/2 mL PnIj Inject the contents of 1 pen (300mg ) under the skin every 28 days as maintenance 2 mL 11    silver sulfADIAZINE (SILVADENE, SSD) 1 % cream Apply to affected area daily 50 g 1     No current facility-administered medications for this visit.       No Known Allergies    Patient Active Problem List   Diagnosis    Essential hypertension    Hyperlipidemia    Grouped skin lesions    Post-nasal drip       Medication list has been reviewed and updated in Epic: Yes    Allergies have been reviewed and updated in Epic: Yes    Appropriateness of Therapy     Acute infections noted within Epic:  No active infections  Patient reported infection: None    Is the medication and dose appropriate based on diagnosis, medication list, comorbidities, allergies, medical history, patient???s ability to self-administer the medication, and therapeutic goals? Yes    Prescription has been clinically reviewed: Yes      Baseline Quality of Life Assessment      How many days over the past month did your HS  keep you from your normal activities? For example, brushing your teeth or getting up in the morning. Patient  declined to answer    Financial Information     Medication Assistance provided: Prior Authorization and Copay Assistance    Anticipated copay of $0 reviewed with patient. Verified delivery address.    Delivery Information     Scheduled delivery date: 3/3    Expected start date: 3/3      Medication will be delivered via Same Day Courier to the prescription address in Epic [place].  This shipment will not require a signature.      Explained the services we provide at [facility] and that each month we would call to set up refills.  Stressed importance of returning phone calls so that we could ensure they receive their medications in time each month.  Informed patient that we should be setting up refills 7-10 days prior to when they will run out of medication.  A pharmacist will reach out to perform a clinical assessment periodically.  Informed patient that a welcome packet, containing information about our pharmacy and other support services, a Notice of Privacy Practices, and a drug information handout will be sent.      The patient or caregiver noted above participated in the development of this care plan and knows that they can request review of or adjustments to the care plan at any time.      Patient or caregiver verbalized understanding of the above information as well as how to contact the pharmacy at [phone] option 4 with any questions/concerns.  The pharmacy is open Monday through Friday 8:30am-4:30pm.  A pharmacist is available 24/7 via pager to answer any clinical questions they may have.    Patient Specific Needs     Does the patient have any physical, cognitive, or cultural barriers? No    Does the patient have adequate living arrangements? (i.e. the ability to store and take their medication appropriately) Yes    Did you identify any home environmental safety or security hazards? No    Patient prefers to have medications discussed with  Patient     Is the patient or caregiver able to read and understand education materials at a high school level or above? Yes    Patient's primary language is  English     Is the patient high risk? No    Does the patient have an additional or emergency contact listed in their chart? Yes    SOCIAL DETERMINANTS OF HEALTH     At the [facility], we have learned that life circumstances - like trouble affording food, housing, utilities, or transportation can affect the health of many of our patients.   That is why we wanted to ask: are you currently experiencing any life circumstances that are negatively impacting your health and/or quality of life? Patient declined to answer    Social Drivers of Health     Food Insecurity: No Food Insecurity (11/17/2021)    Hunger Vital Sign     Worried About Running Out of Food in the Last Year: Never true     Ran Out of Food in the Last Year: Never true   Internet Connectivity: No Internet connectivity concern identified (06/06/2022)    Internet Connectivity     Do you have access to internet services: Yes     How do you connect to the internet: Personal Device at home     Is your internet connection strong enough for you to watch video on your device without major problems?: Yes  Do you have enough data to get through the month?: Yes     Does at least one of the devices have a camera that you can use for video chat?: Yes   Housing/Utilities: Low Risk  (11/17/2021)    Housing/Utilities     Within the past 12 months, have you ever stayed: outside, in a car, in a tent, in an overnight shelter, or temporarily in someone else's home (i.e. couch-surfing)?: No     Are you worried about losing your housing?: No     Within the past 12 months, have you been unable to get utilities (heat, electricity) when it was really needed?: No   Tobacco Use: Medium Risk (12/20/2023)    Patient History     Smoking Tobacco Use: Former     Smokeless Tobacco Use: Never     Passive Exposure: Not on file   Transportation Needs: No Transportation Needs (11/17/2021)    PRAPARE - Therapist, art (Medical): No     Lack of Transportation (Non-Medical): No   Alcohol Use: Not At Risk (07/20/2022)    Alcohol Use     How often do you have a drink containing alcohol?: Never     How many drinks containing alcohol do you have on a typical day when you are drinking?: Not on file     How often do you have 5 or more drinks on one occasion?: Never   Interpersonal Safety: Not on file   Physical Activity: Not on file   Intimate Partner Violence: Not on file   Stress: Not on file   Substance Use: Not on file (09/12/2023)   Social Connections: Not on file   Financial Resource Strain: Low Risk  (11/17/2021)    Overall Financial Resource Strain (CARDIA)     Difficulty of Paying Living Expenses: Not very hard   Depression: Not at risk (11/29/2023)    PHQ-2     PHQ-2 Score: 0   Health Literacy: Not on file       Would you be willing to receive help with any of the needs that you have identified today? Not applicable       Sabastian Raimondi [facility] Specialty Pharmacist

## 2024-01-08 MED FILL — EMPTY CONTAINER: 120 days supply | Qty: 1 | Fill #0

## 2024-01-25 NOTE — Unmapped (Signed)
 Heather Watts reports she's had a lot of improvement since starting Cosentyx - she estimates 75% improvement.     Her 5th and final loading dose will be due on 5/31, then she'll graduate to maintenance dosing 4 weeks later.      [facility] Clinical Assessment & Refill Coordination Note    Heather Watts, DOB: July 06, 1971  Phone: There are no phone numbers on file.    All above HIPAA information was verified with patient.     Was a Nurse, learning disability used for this call? No    Specialty Medication(s):   Inflammatory Disorders: Cosentyx     Current Outpatient Medications   Medication Sig Dispense Refill    amlodipine (NORVASC) 10 MG tablet Take 1 tablet (10 mg total) by mouth daily. TAKE 1 TABLET(10 MG) BY MOUTH DAILY 90 tablet 0    chlorthalidone (HYGROTON) 25 MG tablet Take 1 tablet (25 mg total) by mouth daily. TAKE 1 TABLET(25 MG) BY MOUTH DAILY 90 tablet 0    clindamycin (CLEOCIN T) 1 % lotion Apply topically two (2) times a day. 60 mL 3    clobetasoL (TEMOVATE) 0.05 % ointment Apply the medication twice daily to stubborn areas of the skin until smooth. Then stop and re-start as the skin changes come back. 60 g 5    doxycycline (MONODOX) 100 MG capsule Take one capsule by mouth twice daily for 10 days. 20 capsule 2    empty container Misc Use as directed to dispose of needles. When full, make sure lid is closed tightly then dispose of container in trash. 1 each 2    ergocalciferol-1,250 mcg, 50,000 unit, (DRISDOL) 1,250 mcg (50,000 unit) capsule Take 1 capsule (1,250 mcg total) by mouth once a week. 4 capsule 2    ibuprofen (MOTRIN) 800 MG tablet Take 1 tablet (800 mg total) by mouth every eight (8) hours as needed for pain. 60 tablet 2    methylPREDNISolone (MEDROL DOSEPACK) 4 mg tablet Take 1 tablet (4 mg total) by mouth daily. follow package directions 1 each 0    potassium chloride 20 MEQ ER tablet TAKE 2 TABLETS(40 MEQ) BY MOUTH DAILY 120 tablet 2    potassium chloride 20 MEQ ER tablet Take 1 tablet (20 mEq total) by mouth.      secukinumab (COSENTYX UNOREADY PEN) 300 mg/2 mL PnIj Inject the contents of 1 pen (300mg ) under the skin weekly for 5 weeks as loading doses. (on weeks 0,1,2,3,4 as loading doses) 10 mL 0    secukinumab (COSENTYX UNOREADY PEN) 300 mg/2 mL PnIj Inject the contents of 1 pen (300mg ) under the skin every 28 days as maintenance 2 mL 11    silver sulfADIAZINE (SILVADENE, SSD) 1 % cream Apply to affected area daily 50 g 1     No current facility-administered medications for this visit.        Changes to medications: Heather Watts reports no changes at this time.    Medication list has been reviewed and updated in Epic: Yes    No Known Allergies    Changes to allergies: No    Allergies have been reviewed and updated in Epic: Yes    SPECIALTY MEDICATION ADHERENCE     Cosentyx - 1 left     Specialty medication(s) dose(s) confirmed: Regimen is correct and unchanged.     Are there any concerns with adherence? No    Adherence counseling provided? Not needed    CLINICAL MANAGEMENT AND INTERVENTION  Clinical Benefit Assessment:    Do you feel the medicine is effective or helping your condition? Yes    Clinical Benefit counseling provided? Not needed    Adverse Effects Assessment:    Are you experiencing any side effects?  UNCLEAR - patient reports more itching overall but denies any severe itching or any rashes/blisters. She'll continue to monitor.     Are you experiencing difficulty administering your medicine? No    Quality of Life Assessment:    Quality of Life    Rheumatology  Oncology  Dermatology  Cystic Fibrosis          How many days over the past month did your HS  keep you from your normal activities? For example, brushing your teeth or getting up in the morning. Patient declined to answer    Have you discussed this with your provider? Not needed    Acute Infection Status:    Acute infections noted within Epic:  No active infections    Patient reported infection: None    Therapy Appropriateness:    Is therapy appropriate based on current medication list, adverse reactions, adherence, clinical benefit and progress toward achieving therapeutic goals? Yes, therapy is appropriate and should be continued     Clinical Intervention:    Was an intervention completed as part of this clinical assessment? No    DISEASE/MEDICATION-SPECIFIC INFORMATION      For patients on injectable medications: Patient currently has 1 doses left.  Next injection is scheduled for 3/24 (this delivery will be for the 5th and final loading dose, due on 5/31).    Chronic Inflammatory Diseases: Have you experienced any flares in the last month? No  Has this been reported to your provider? No    PATIENT SPECIFIC NEEDS     Does the patient have any physical, cognitive, or cultural barriers? No    Is the patient high risk? No    Does the patient require physician intervention or other additional services (i.e., nutrition, smoking cessation, social work)? No    Does the patient have an additional or emergency contact listed in their chart? Yes    SOCIAL DETERMINANTS OF HEALTH     At the [facility], we have learned that life circumstances - like trouble affording food, housing, utilities, or transportation can affect the health of many of our patients.   That is why we wanted to ask: are you currently experiencing any life circumstances that are negatively impacting your health and/or quality of life? Patient declined to answer    Social Drivers of Health     Food Insecurity: No Food Insecurity (11/17/2021)    Hunger Vital Sign     Worried About Running Out of Food in the Last Year: Never true     Ran Out of Food in the Last Year: Never true   Tobacco Use: Medium Risk (12/20/2023)    Patient History     Smoking Tobacco Use: Former     Smokeless Tobacco Use: Never     Passive Exposure: Not on file   Transportation Needs: No Transportation Needs (11/17/2021)    PRAPARE - Therapist, art (Medical): No Lack of Transportation (Non-Medical): No   Alcohol Use: Not At Risk (07/20/2022)    Alcohol Use     How often do you have a drink containing alcohol?: Never     How many drinks containing alcohol do you have on a typical day when you are drinking?:  Not on file     How often do you have 5 or more drinks on one occasion?: Never   Housing: Not on file   Physical Activity: Not on file   Utilities: Not on file   Stress: Not on file   Interpersonal Safety: Not on file   Substance Use: Not on file (09/12/2023)   Intimate Partner Violence: Not on file   Social Connections: Not on file   Financial Resource Strain: Low Risk  (11/17/2021)    Overall Financial Resource Strain (CARDIA)     Difficulty of Paying Living Expenses: Not very hard   Depression: Not at risk (11/29/2023)    PHQ-2     PHQ-2 Score: 0   Internet Connectivity: No Internet connectivity concern identified (06/06/2022)    Internet Connectivity     Do you have access to internet services: Yes     How do you connect to the internet: Personal Device at home     Is your internet connection strong enough for you to watch video on your device without major problems?: Yes     Do you have enough data to get through the month?: Yes     Does at least one of the devices have a camera that you can use for video chat?: Yes   Health Literacy: Not on file       Would you be willing to receive help with any of the needs that you have identified today? Not applicable       SHIPPING     Specialty Medication(s) to be Shipped:   Inflammatory Disorders: Cosentyx    Other medication(s) to be shipped: No additional medications requested for fill at this time     Changes to insurance: No    Cost and Payment: Patient has a $0 copay, payment information is not required.    Delivery Scheduled: Yes, Expected medication delivery date: 3/21.     Medication will be delivered via Same Day Courier to the confirmed prescription address in [facility].    The patient will receive a drug information handout for each medication shipped and additional FDA Medication Guides as required.  Verified that patient has previously received a Conservation officer, historic buildings and a Surveyor, mining.    The patient or caregiver noted above participated in the development of this care plan and knows that they can request review of or adjustments to the care plan at any time.      All of the patient's questions and concerns have been addressed.    Jameila Keeny [facility] Specialty Pharmacist

## 2024-01-26 MED FILL — COSENTYX UNOREADY PEN 300 MG/2 ML SUBCUTANEOUS: SUBCUTANEOUS | 28 days supply | Qty: 2 | Fill #1

## 2024-03-05 NOTE — Unmapped (Signed)
 [facility] Refill Coordination Note    Specialty Medication(s) to be Shipped:   Inflammatory Disorders: Cosentyx     Other medication(s) to be shipped: No additional medications requested for fill at this time     Heather Watts, DOB: 07/14/1971  Phone: There are no phone numbers on file.      All above HIPAA information was verified with patient.     Was a Nurse, learning disability used for this call? No    Completed refill call assessment today to schedule patient's medication shipment from the [facility]  [phone]).  All relevant notes have been reviewed.     Specialty medication(s) and dose(s) confirmed: Regimen is correct and unchanged.   Changes to medications: [name] reports no changes at this time.  Changes to insurance: No  New side effects reported not previously addressed with a pharmacist or physician: None reported  Questions for the pharmacist: No    Confirmed patient received a Conservation officer, historic buildings and a Surveyor, mining with first shipment. The patient will receive a drug information handout for each medication shipped and additional FDA Medication Guides as required.       DISEASE/MEDICATION-SPECIFIC INFORMATION        For patients on injectable medications: Patient currently has 0 doses left.  Next injection is scheduled for 4.29.2025.    SPECIALTY MEDICATION ADHERENCE     Medication Adherence    Patient reported X missed doses in the last month: 0  Specialty Medication: secukinumab  (COSENTYX  UNOREADY PEN) 300 mg/2 mL PnIj  Patient is on additional specialty medications: No  Patient is on more than two specialty medications: No  Any gaps in refill history greater than 2 weeks in the last 3 months: no  Demonstrates understanding of importance of adherence: yes              Were doses missed due to medication being on hold? No      secukinumab : COSENTYX  UNOREADY PEN 300 mg/2 mL Pnij: 0 doses of medicine on hand       REFERRAL TO PHARMACIST Referral to the pharmacist: Not needed      SHIPPING     Shipping address confirmed in Epic.     Cost and Payment: Patient has a $0 copay, payment information is not required.    Delivery Scheduled: Yes, Expected medication delivery date: 4.30.2025.     Medication will be delivered via Same Day Courier to the prescription address in Epic [facility].    [name]   [facility]  Specialty Technician

## 2024-03-06 MED FILL — COSENTYX UNOREADY PEN 300 MG/2 ML SUBCUTANEOUS PEN INJECTOR: SUBCUTANEOUS | 28 days supply | Qty: 2 | Fill #0

## 2024-03-25 MED ORDER — POTASSIUM CHLORIDE ER 20 MEQ TABLET,EXTENDED RELEASE(PART/CRYST)
ORAL_TABLET | 2 refills | 0.00000 days
Start: 2024-03-25 — End: ?

## 2024-03-26 NOTE — Unmapped (Signed)
 Refill request received for patient.      Medication Requested: potassium  Last Office Visit: Visit date not found   Next Office Visit: Visit date not found  Last Prescriber: Irene Mannheim    Nurse refill requirements met? No  If not met, why: needs office visit    Sent to: Provider for signing  If sent to provider, which provider?: Irene Mannheim

## 2024-03-27 MED ORDER — POTASSIUM CHLORIDE ER 20 MEQ TABLET,EXTENDED RELEASE(PART/CRYST)
ORAL_TABLET | 2 refills | 0.00000 days
Start: 2024-03-27 — End: ?

## 2024-03-27 NOTE — Unmapped (Signed)
 Needs an appointment.

## 2024-03-28 NOTE — Unmapped (Signed)
 [facility] Refill Coordination Note    Specialty Medication(s) to be Shipped:   Inflammatory Disorders: Cosentyx     Other medication(s) to be shipped: No additional medications requested for fill at this time     Heather Watts, DOB: 1971-05-30  Phone: There are no phone numbers on file.      All above HIPAA information was verified with patient.     Was a Nurse, learning disability used for this call? No    Completed refill call assessment today to schedule patient's medication shipment from the [facility]  [phone]).  All relevant notes have been reviewed.     Specialty medication(s) and dose(s) confirmed: Regimen is correct and unchanged.   Changes to medications: Yashira reports no changes at this time.  Changes to insurance: No  New side effects reported not previously addressed with a pharmacist or physician: None reported  Questions for the pharmacist: No    Confirmed patient received a Conservation officer, historic buildings and a Surveyor, mining with first shipment. The patient will receive a drug information handout for each medication shipped and additional FDA Medication Guides as required.       DISEASE/MEDICATION-SPECIFIC INFORMATION        For patients on injectable medications: Patient currently has 0 doses left.  Next injection is scheduled for 04/03/2024.    SPECIALTY MEDICATION ADHERENCE     Medication Adherence    Patient reported X missed doses in the last month: 0  Specialty Medication: secukinumab  (COSENTYX  UNOREADY PEN) 300 mg/2 mL PnIj  Patient is on additional specialty medications: No  Informant: patient              Were doses missed due to medication being on hold? No      secukinumab : COSENTYX  UNOREADY PEN 300 mg/2 mL Pnij: 0 doses of medicine on hand       REFERRAL TO PHARMACIST     Referral to the pharmacist: Not needed      SHIPPING     Shipping address confirmed in Epic.     Cost and Payment: Patient has a $0 copay, payment information is not required.    Delivery Scheduled: Yes, Expected medication delivery date: 5.27.2025.     Medication will be delivered via Same Day Courier to the prescription address in Epic [facility].    [facility]

## 2024-04-02 MED FILL — COSENTYX UNOREADY PEN 300 MG/2 ML SUBCUTANEOUS PEN INJECTOR: SUBCUTANEOUS | 28 days supply | Qty: 2 | Fill #1

## 2024-04-16 ENCOUNTER — Ambulatory Visit: Admit: 2024-04-16 | Discharge: 2024-04-17 | Payer: BLUE CROSS/BLUE SHIELD | Attending: Family | Primary: Family

## 2024-04-16 ENCOUNTER — Ambulatory Visit: Admit: 2024-04-16 | Discharge: 2024-04-17 | Payer: BLUE CROSS/BLUE SHIELD

## 2024-04-16 DIAGNOSIS — Z1211 Encounter for screening for malignant neoplasm of colon: Principal | ICD-10-CM

## 2024-04-16 DIAGNOSIS — Z13220 Encounter for screening for lipoid disorders: Principal | ICD-10-CM

## 2024-04-16 DIAGNOSIS — Z1231 Encounter for screening mammogram for malignant neoplasm of breast: Principal | ICD-10-CM

## 2024-04-16 DIAGNOSIS — I10 Essential (primary) hypertension: Principal | ICD-10-CM

## 2024-04-16 DIAGNOSIS — E213 Hyperparathyroidism, unspecified: Principal | ICD-10-CM

## 2024-04-16 DIAGNOSIS — E876 Hypokalemia: Principal | ICD-10-CM

## 2024-04-16 DIAGNOSIS — R7989 Other specified abnormal findings of blood chemistry: Principal | ICD-10-CM

## 2024-04-16 DIAGNOSIS — Z131 Encounter for screening for diabetes mellitus: Principal | ICD-10-CM

## 2024-04-16 DIAGNOSIS — Z8639 Personal history of other endocrine, nutritional and metabolic disease: Principal | ICD-10-CM

## 2024-04-16 LAB — COMPREHENSIVE METABOLIC PANEL
ALBUMIN: 3.7 g/dL (ref 3.4–5.0)
ALKALINE PHOSPHATASE: 93 U/L (ref 46–116)
ALT (SGPT): 16 U/L (ref 10–49)
ANION GAP: 8 mmol/L (ref 5–14)
AST (SGOT): 15 U/L (ref <=34)
BILIRUBIN TOTAL: 0.5 mg/dL (ref 0.3–1.2)
BLOOD UREA NITROGEN: 9 mg/dL (ref 9–23)
BUN / CREAT RATIO: 13
CALCIUM: 10.2 mg/dL (ref 8.7–10.4)
CHLORIDE: 103 mmol/L (ref 98–107)
CO2: 29 mmol/L (ref 20.0–31.0)
CREATININE: 0.72 mg/dL (ref 0.55–1.02)
EGFR CKD-EPI (2021) FEMALE: >90 mL/min/1.73m2 (ref >=60)
GLUCOSE RANDOM: 93 mg/dL (ref 70–99)
POTASSIUM: 3.6 mmol/L (ref 3.4–4.8)
PROTEIN TOTAL: 8.4 g/dL — ABNORMAL HIGH (ref 5.7–8.2)
SODIUM: 140 mmol/L (ref 135–145)

## 2024-04-16 LAB — HEMOGLOBIN A1C
ESTIMATED AVERAGE GLUCOSE: 117 mg/dL
HEMOGLOBIN A1C: 5.7 % — ABNORMAL HIGH (ref 4.8–5.6)

## 2024-04-16 LAB — CBC
HEMATOCRIT: 36.8 % (ref 34.0–44.0)
HEMOGLOBIN: 12 g/dL (ref 11.3–14.9)
MEAN CORPUSCULAR HEMOGLOBIN CONC: 32.7 g/dL (ref 32.0–36.0)
MEAN CORPUSCULAR HEMOGLOBIN: 24.7 pg — ABNORMAL LOW (ref 25.9–32.4)
MEAN CORPUSCULAR VOLUME: 75.6 fL — ABNORMAL LOW (ref 77.6–95.7)
MEAN PLATELET VOLUME: 7 fL (ref 6.8–10.7)
PLATELET COUNT: 408 10*9/L (ref 150–450)
RED BLOOD CELL COUNT: 4.87 10*12/L (ref 3.95–5.13)
RED CELL DISTRIBUTION WIDTH: 16.2 % — ABNORMAL HIGH (ref 12.2–15.2)
WBC ADJUSTED: 8.1 10*9/L (ref 3.6–11.2)

## 2024-04-16 LAB — PARATHYROID HORMONE (PTH): PARATHYROID HORMONE INTACT: 101 pg/mL — ABNORMAL HIGH (ref 18.4–80.1)

## 2024-04-16 LAB — LIPID PANEL
CHOLESTEROL: 212 mg/dL — ABNORMAL HIGH (ref <200)
HDL CHOLESTEROL: 47 mg/dL — ABNORMAL LOW (ref >50)
LDL CHOLESTEROL CALCULATED: 143 mg/dL — ABNORMAL HIGH (ref <100)
NON-HDL CHOLESTEROL: 165 mg/dL — ABNORMAL HIGH (ref <130)
TRIGLYCERIDES: 126 mg/dL (ref <150)

## 2024-04-16 LAB — TSH: THYROID STIMULATING HORMONE: 0.64 u[IU]/mL (ref 0.550–4.780)

## 2024-04-16 MED ORDER — CHLORTHALIDONE 25 MG TABLET
ORAL_TABLET | Freq: Every day | ORAL | 1 refills | 90.00000 days | Status: CP
Start: 2024-04-16 — End: 2025-04-15

## 2024-04-16 MED ORDER — AMLODIPINE 10 MG TABLET
ORAL_TABLET | Freq: Every day | ORAL | 1 refills | 90.00000 days | Status: CP
Start: 2024-04-16 — End: 2025-04-15

## 2024-04-16 MED ORDER — POTASSIUM CHLORIDE ER 20 MEQ TABLET,EXTENDED RELEASE(PART/CRYST)
ORAL_TABLET | Freq: Every day | ORAL | 1 refills | 90.00000 days | Status: CP
Start: 2024-04-16 — End: 2025-04-15

## 2024-04-16 NOTE — Unmapped (Signed)
 Assessment and Plan:         Essential hypertension  BP at goal, 138/88 today in clinic. Continue amlodipine  10 mg daily. Continue to chlorthalidone  25 mg daily. Monitor home BP. Reviewed DASH diet and exercise recommendations.    - CBC  - Comprehensive Metabolic Panel  - amlodipine  (NORVASC ) 10 MG tablet; Take 1 tablet (10 mg total) by mouth daily. TAKE 1 TABLET(10 MG) BY MOUTH DAILY  - chlorthalidone  (HYGROTON ) 25 MG tablet; Take 1 tablet (25 mg total) by mouth daily. TAKE 1 TABLET(25 MG) BY MOUTH DAILY    Mixed hyperlipidemia  Previously elevated total cholesterol and LDL. Recommend low cholesterol diet. Recheck lipid panel.   - Lipid Panel    Screening for colon cancer  - Colorectal Cancer DNA + FIT    Screening mammogram for breast cancer  - Mammography screening bilateral; Future    Screening for diabetes mellitus  - Hemoglobin A1c    Hyperparathyroidism (HHS-HCC)  - Parathyroid Hormone (PTH)    History of vitamin D  deficiency  - Vitamin D  25 Hydroxy (25OH D2 + D3)    Hypokalemia  - potassium chloride  20 MEQ ER tablet; Take 1 tablet (20 mEq total) by mouth daily.    Low TSH level  - TSH           At onset of visit I advised patient I am using a new tool to record our discussion today. The recording would write down what we discuss and once reviewed and finalized would become a part of the chart note for today's visit. Patient amenable to recording.     I personally spent 30 minutes face-to-face and non-face-to-face in the care of this patient, which includes all pre, intra, and post visit time on the date of service.    Return in about 3 months (around 07/17/2024) for Next scheduled follow up, BP recheck .    HPI:      Heather Watts is here for   Chief Complaint   Patient presents with    Hypertension     Fasting.    Hyperlipidemia    Colon Cancer Screening     Declines colonoscopy.  Agrees to do Cologuard.    Breast cancer screening     Agrees to schedule.    Immunizations     Declines Shingrix.    Pap reminder     She will schedule her pap.       Hypertension: Patient presents for follow-up of hypertension. Blood pressure goal < 140/90.  Hypertension has customarily not been at goal complicated by obesity, electrolyte imbalance.  Home blood pressure readings: averages 135/90. Salt intake and diet: salt not added to cooking and salt shaker not on table. Associated signs and symptoms: palpitations, peripheral edema. Patient denies: blurred vision, chest pain, dyspnea, headache, neck aches, orthopnea, paroxysmal nocturnal dyspnea, pulsating in the ears, and tiredness/fatigue. Medication compliance: taking as prescribed. She is not doing regular exercise.         Hyperlipidemia: Patient presents for follow-up of dyslipidemia. Compliance with treatment has been fair. The patient exercises rarely. Patient is not currently on medication for this condition. She reports myalgias due to atorvastatin in the past. She is somewhat adhering to a low fat/low cholesterol diet.        ROS:      Comprehensive 10 point ROS negative unless otherwise stated in the HPI.      [facility] Components:     Medication adherence and barriers to the  treatment plan have been addressed. Opportunities to optimize healthy behaviors have been discussed. Patient / caregiver voiced understanding.    Past Medical/Surgical History:     Past Medical History[1]  Past Surgical History[2]    Family History:     Family History[3]    Social History:     Short Social History[4]    Allergies:     Patient has no known allergies.    Current Medications:     Current Medications[5]    Health Maintenance:     Health Maintenance   Topic Date Due    HPV Cotest with Pap Smear (21-65)  Never done    Pap Smear with Cotest HPV (21-65)  09/06/2005    Mammogram  Never done    Colon Cancer Screening  Never done    COVID-19 Vaccine (3 - Pfizer risk series) 11/30/2020    Serum Creatinine Monitoring  09/25/2023    Potassium Monitoring  09/25/2023    Pneumococcal Vaccine 50+ (1 of 2 - PCV) 10/16/2024 (Originally 12/07/1991)    Influenza Vaccine (Season Ended) 07/08/2024    Lipid Screening  09/24/2026    DTaP/Tdap/Td Vaccines (4 - Td or Tdap) 07/04/2028    Hepatitis C Screen  Completed    Zoster Vaccines  Discontinued       Immunizations:     Immunization History   Administered Date(s) Administered    COVID-19 VACC,MRNA,(PFIZER)(PF) 06/20/2020, 11/02/2020    HEPATITIS B VACCINE ADULT,IM(ENERGIX B, RECOMBIVAX) 07/04/2018, 08/01/2018, 12/06/2018    INFLUENZA TIV (TRI) PF (IM)(HISTORICAL) 12/09/2009    Influenza Vaccine Quad (IIV4 W/PRESERV) 35MO+ 08/27/2021    Influenza Vaccine Quad(IM)6 MO-Adult(PF) 08/15/2018, 08/13/2019    Influenza Virus Vaccine, unspecified formulation 08/15/2018, 08/13/2019, 08/22/2020    MMR 05/26/2008, 07/11/2008    TD, ADSORBED, 5LF TETANUS TOXOID, PF(TENIVAC/DECAVAC) 07/11/2008, 07/04/2018    TdaP 05/14/2008     I have reviewed and (if needed) updated the patient's problem list, medications, allergies, past medical and surgical history, social and family history.     Vital Signs:     Wt Readings from Last 3 Encounters:   04/16/24 (!) 105.7 kg (233 lb)   09/24/22 (!) 102.1 kg (225 lb)   07/20/22 (!) 105.2 kg (232 lb)     Temp Readings from Last 3 Encounters:   04/16/24 36.6 ??C (97.9 ??F) (Oral)   09/24/22 36.9 ??C (98.4 ??F) (Oral)   07/20/22 37.1 ??C (98.8 ??F) (Oral)     BP Readings from Last 3 Encounters:   04/16/24 146/106   09/24/22 123/99   07/20/22 116/66     Pulse Readings from Last 3 Encounters:   04/16/24 96   09/24/22 81   07/20/22 94     Estimated body mass index is 35.43 kg/m?? as calculated from the following:    Height as of this encounter: 172.7 cm (5' 8).    Weight as of this encounter: 105.7 kg (233 lb).  Facility age limit for growth %iles is 20 years.      Objective:       General: Alert and oriented x3. Well-appearing. No acute distress.   HEENT:  Normocephalic.  Atraumatic. Conjunctiva and sclera normal. OP MMM without lesions.   Neck:  Supple. No thyroid enlargement. No adenopathy.   Heart:  Regular rate and rhythm. Normal S1, S2. No murmurs, rubs or gallops.   Lungs:  No respiratory distress.  Lungs clear to auscultation. No wheezes, rhonchi, or rales.   GI/GU:  Soft, +BS, nondistended, non-TTP. No palpable masses  or organomegaly.   Extremities:  No edema. Peripheral pulses normal.   Skin:  Warm, dry. No rash or lesions present. Left mid abdomen.   Neuro:  Non-focal. No obvious weakness.   Psych:  Affect normal, eye contact good, speech clear and coherent.           Results          Levorn LELON Snide, FNP          [1]   Past Medical History:  Diagnosis Date    COVID-19 11/06/2020    GERD (gastroesophageal reflux disease)     Hyperlipidemia     Hypertension     Obesity slightly   [2] No past surgical history on file.  [3]   Family History  Problem Relation Age of Onset    Cancer Mother     Hypertension Mother         Everyone on mother's side of family    COPD Father     Hyperlipidemia Father     Hypertension Father     Cancer Maternal Aunt     Cancer Maternal Uncle     Early death Maternal Uncle         Heart Attack    Aneurysm Maternal Uncle     Stroke Maternal Grandmother     Cancer Son     Stroke Sister     Hypertension Sister     Cancer Daughter     Melanoma Neg Hx     Basal cell carcinoma Neg Hx     Squamous cell carcinoma Neg Hx    [4]   Social History  Tobacco Use    Smoking status: Former     Current packs/day: 0.00     Average packs/day: 0.5 packs/day for 14.0 years (7.0 ttl pk-yrs)     Types: Cigarettes     Start date: 03/19/1991     Quit date: 03/18/2005     Years since quitting: 19.0    Smokeless tobacco: Never    Tobacco comments:     Quit smoking 2005   Vaping Use    Vaping status: Never Used   Substance Use Topics    Alcohol use: Not Currently    Drug use: Never   [5]   Current Outpatient Medications   Medication Sig Dispense Refill    empty container Misc Use as directed to dispose of needles. When full, make sure lid is closed tightly then dispose of container in trash. 1 each 2    secukinumab  (COSENTYX  UNOREADY PEN) 300 mg/2 mL PnIj Inject the contents of 1 pen (300mg ) under the skin every 28 days as maintenance 2 mL 11    silver  sulfADIAZINE  (SILVADENE , SSD) 1 % cream Apply to affected area daily 50 g 1    amlodipine  (NORVASC ) 10 MG tablet Take 1 tablet (10 mg total) by mouth daily. TAKE 1 TABLET(10 MG) BY MOUTH DAILY 90 tablet 1    chlorthalidone  (HYGROTON ) 25 MG tablet Take 1 tablet (25 mg total) by mouth daily. TAKE 1 TABLET(25 MG) BY MOUTH DAILY 90 tablet 1    clindamycin  (CLEOCIN  T) 1 % lotion Apply topically two (2) times a day. (Patient not taking: Reported on 04/16/2024) 60 mL 3    doxycycline  (MONODOX ) 100 MG capsule Take one capsule by mouth twice daily for 10 days. (Patient not taking: Reported on 04/16/2024) 20 capsule 2    ergocalciferol -1,250 mcg, 50,000 unit, (DRISDOL ) 1,250 mcg (50,000 unit) capsule Take 1 capsule (1,250  mcg total) by mouth once a week. (Patient not taking: Reported on 04/16/2024) 4 capsule 2    ibuprofen  (MOTRIN ) 800 MG tablet Take 1 tablet (800 mg total) by mouth every eight (8) hours as needed for pain. (Patient not taking: Reported on 04/16/2024) 60 tablet 2    potassium chloride  20 MEQ ER tablet Take 1 tablet (20 mEq total) by mouth daily. 90 tablet 1     No current facility-administered medications for this visit.

## 2024-04-22 LAB — VITAMIN D 25 HYDROXY: VITAMIN D, TOTAL (25OH): 21.7 ng/mL (ref 20.0–80.0)

## 2024-04-23 NOTE — Unmapped (Signed)
 [facility] Delivery Pharm[facility] Refill Coordination Note    Specialty Medication(s) to be Shipped:   Inflammatory Disorders: Cosentyx     Other medication(s) to be shipped: No additional medications requested for fill at this time     Heather Watts, DOB: September 12, 1971  Phone: There are no phone numbers on file.      All above HIPAA information was verified with patient.     Was a Nurse, learning disability used for this call? No    Completed refill call assessment today to schedule patient's medication shipment from the [facility]  [phone]).  All relevant notes have been reviewed.     Specialty medication(s) and dose(s) confirmed: Regimen is correct and unchanged.   Changes to medications: [name] reports no changes at this time.  Changes to insurance: No  New side effects reported not previously addressed with a pharmacist or physician: None reported  Questions for the pharmacist: No    Confirmed patient received a Conservation officer, historic buildings and a Surveyor, mining with first shipment. The patient will receive a drug information handout for each medication shipped and additional FDA Medication Guides as required.       DISEASE/MEDICATION-SPECIFIC INFORMATION        For patients on injectable medications: Patient currently has 0 doses left.  Next injection is scheduled for 05/01/2024.    SPECIALTY MEDICATION ADHERENCE     Medication Adherence    Patient reported X missed doses in the last month: 0  Specialty Medication: secukinumab  (COSENTYX  UNOREADY PEN) 300 mg/2 mL PnIj  Patient is on additional specialty medications: No  Informant: patient              Were doses missed due to medication being on hold? No      secukinumab : COSENTYX  UNOREADY PEN 300 mg/2 mL Pnij: 0 doses of medicine on hand       REFERRAL TO PHARMACIST     Referral to the pharmacist: Not needed      SHIPPING     Shipping address confirmed in Epic.     Cost and Payment: Patient has a $0 copay, payment information is not required.    Delivery Scheduled: Yes, Expected medication delivery date: 6.20.2025.     Medication will be delivered via Same Day Courier to the prescription address in Epic WAM.    Jeoffrey JAYSON Sherra UNK Specialty and [facility]

## 2024-04-26 MED FILL — COSENTYX UNOREADY PEN 300 MG/2 ML SUBCUTANEOUS PEN INJECTOR: SUBCUTANEOUS | 28 days supply | Qty: 2 | Fill #2

## 2024-05-14 NOTE — Unmapped (Signed)
 Addended by: Wenceslaus Gist M on: 05/14/2024 05:20 PM     Modules accepted: Orders

## 2024-05-14 NOTE — Unmapped (Signed)
 Ted up referral.  She requests [facility] but you may want to change if you think it will be to far out.

## 2024-05-15 NOTE — Unmapped (Signed)
 Addended by: GERALENE LEVORN ORN on: 05/15/2024 12:45 PM     Modules accepted: Orders

## 2024-05-15 NOTE — Unmapped (Signed)
 Cosentyx  - last filled 04/26/24 as a 28-day supply. Previous encounter documentation reflects the patient will use their next injection on 05/01/24 and patient reported having 0 injections on hand. Rescheduling RC date because the patient should not need their next dose until 05/29/24.

## 2024-05-20 DIAGNOSIS — J019 Acute sinusitis, unspecified: Principal | ICD-10-CM

## 2024-05-20 MED ORDER — CETIRIZINE 10 MG TABLET
ORAL_TABLET | Freq: Every day | ORAL | 0 refills | 30.00000 days | Status: CP
Start: 2024-05-20 — End: 2024-06-19

## 2024-05-20 MED ORDER — IPRATROPIUM BROMIDE 21 MCG (0.03 %) NASAL SPRAY
Freq: Three times a day (TID) | NASAL | 0 refills | 115.00000 days | Status: CP | PRN
Start: 2024-05-20 — End: 2025-05-20

## 2024-05-20 MED ORDER — AMOXICILLIN 875 MG-POTASSIUM CLAVULANATE 125 MG TABLET
ORAL_TABLET | Freq: Two times a day (BID) | ORAL | 0 refills | 7.00000 days | Status: CP
Start: 2024-05-20 — End: 2024-05-27

## 2024-05-20 NOTE — Unmapped (Signed)
 [facility] ENT Encounter  This medical encounter was conducted virtually using [email or web address]  [email or web address] protocols.    Patient ID: Heather Watts is a 53 y.o. female who presents by video interaction for evaluation.    I have identified myself to the patient and conveyed my credentials to [name].   Patient has signed informed consent on file in medical record.    Present on Video Call: Is there someone else in the room? No..    Assessment/Plan:    Heather Watts was seen today for sinusitis.    Diagnoses and all orders for this visit:    Acute sinusitis with symptoms > 10 days  -     amoxicillin -clavulanate (AUGMENTIN ) 875-125 mg per tablet; Take 1 tablet by mouth two (2) times a day for 7 days.  -     cetirizine  (ZYRTEC ) 10 MG tablet; Take 1 tablet (10 mg total) by mouth daily.  -     ipratropium (ATROVENT ) 21 mcg (0.03 %) nasal spray; 1 spray into each nostril Three (3) times a day as needed for rhinitis.    - Flonase   - saline nasal spray   - fu if not improving or worsening      -- Discussed the new prescription noted above, including potential side effects, drug interactions, instructions for taking the medication, and the consequences of not taking it.  -- Patient verbalized an understanding of today's assessment and recommendations, as well as the purpose of ongoing medications.    Follow-up as Needed  and Follow-up with PCP        Medication adherence and barriers to the treatment plan have been addressed. Opportunities to optimize healthy behaviors have been discussed. Patient / caregiver voiced understanding.        Subjective:     Sinusitis  Associated symptoms include congestion.     Heather Watts is 53 y.o. and presents today in the [facility] with ENT symptoms.  The PCP for this patient is Heather Levorn Geralds, FNP. Is having a lot of drainage, worse when she lies down at night. The nasal congestin has been ongoing for a couple weeks.   She is using nasal spray, eucalyptus diffuser        ROS  Review of Systems   HENT:  Positive for congestion, postnasal drip and rhinorrhea.         All other ROS per HPI.    I have reviewed the problem list, past medical history, past family history, medications, and allergies and have updated/reconciled them if needed.          Objective:   Physical Exam  As part of this Video Visit, no in-person exam was conducted.  Video interaction permitted the following observations.    General: No acute distress.   HEENT: Nasal congestion.  No visible mass or abnormality of neck.   RESP: Relaxed respiratory effort. No conversational dyspnea.   SKIN: No rashes noted.  NEURO: Normal coordination.  No tremors observed.  PSYCH: Alert and oriented.  Speech fluent and sensible.  Calm affect.           The patient reports they are physically located in [place]  and is currently: at home. I conducted a audio/video visit. I spent  86m 14s on the video call with the patient. I spent an additional 6 minutes on pre- and post-visit activities on the date of service .

## 2024-05-23 NOTE — Unmapped (Signed)
 [facility] Delivery Pharm[facility] Refill Coordination Note    Specialty Medication(s) to be Shipped:   Inflammatory Disorders: Cosentyx     Other medication(s) to be shipped: No additional medications requested for fill at this time     Heather Watts, DOB: 26-Sep-1971  Phone: There are no phone numbers on file.      All above HIPAA information was verified with patient.     Was a Nurse, learning disability used for this call? No    Completed refill call assessment today to schedule patient's medication shipment from the [facility] Delivery Pharm[facility]  [phone]).  All relevant notes have been reviewed.     Specialty medication(s) and dose(s) confirmed: Regimen is correct and unchanged.   Changes to medications: Nayab reports no changes at this time.  Changes to insurance: No  New side effects reported not previously addressed with a pharmacist or physician: None reported  Questions for the pharmacist: No    Confirmed patient received a Conservation officer, historic buildings and a Surveyor, mining with first shipment. The patient will receive a drug information handout for each medication shipped and additional FDA Medication Guides as required.       DISEASE/MEDICATION-SPECIFIC INFORMATION        For patients on injectable medications: Patient currently has 0 doses left.  Next injection is scheduled for 05/29/2024.    SPECIALTY MEDICATION ADHERENCE     Medication Adherence    Patient reported X missed doses in the last month: 0  Specialty Medication: secukinumab  (COSENTYX  UNOREADY PEN) 300 mg/2 mL PnIj  Patient is on additional specialty medications: No  Informant: patient              Were doses missed due to medication being on hold? No      secukinumab : COSENTYX  UNOREADY PEN 300 mg/2 mL Pnij: 0 doses of medicine on hand       REFERRAL TO PHARMACIST     Referral to the pharmacist: Not needed      SHIPPING     Shipping address confirmed in Epic.     Cost and Payment: Patient has a $0 copay, payment information is not required. one time use [employer] debit card on file    Delivery Scheduled: Yes, Expected medication delivery date: 05/28/2024.     Medication will be delivered via Same Day Courier to the prescription address in Epic WAM.    Jeoffrey JAYSON Sherra UNK Specialty and [facility]

## 2024-05-28 MED FILL — COSENTYX UNOREADY PEN 300 MG/2 ML SUBCUTANEOUS PEN INJECTOR: SUBCUTANEOUS | 28 days supply | Qty: 2 | Fill #3

## 2024-06-09 DIAGNOSIS — E559 Vitamin D deficiency, unspecified: Principal | ICD-10-CM

## 2024-06-09 MED ORDER — ERGOCALCIFEROL (VITAMIN D2) 1,250 MCG (50,000 UNIT) CAPSULE
ORAL_CAPSULE | 2 refills | 0.00000 days
Start: 2024-06-09 — End: ?

## 2024-06-21 NOTE — Unmapped (Signed)
 [facility] Home Delivery Pharmacy Refill Coordination Note    Carmalita Wakefield, Switzer: 09-Apr-1971  Phone: There are no phone numbers on file.      All above HIPAA information was verified with patient.         06/20/2024    11:55 AM   Specialty Rx Medication Refill Questionnaire   Which Medications would you like refilled and shipped? Consentyx, I dont have any on hand.   Please list all current allergies: None   Have you missed any doses in the last 30 days? No   Have you had any changes to your medication(s) since your last refill? No   How much of each medication do you have remaining at home? (eg. number of tablets, injections, etc.) None   If receiving an injectable medication, next injection date is 06/20/2024   Have you experienced any side effects in the last 30 days? No   Please enter the full address (street address, city, state, zip code) where you would like your medication(s) to be delivered to. [address] [place] [address]   Please specify on which day you would like your medication(s) to arrive. Note: if you need your medication(s) within 3 days, please call the pharmacy to schedule your order at [phone]  06/24/2024   Has your insurance changed since your last refill? No   Would you like a pharmacist to call you to discuss your medication(s)? No   Do you require a signature for your package? (Note: if we are billing Medicare Part B or your order contains a controlled substance, we will require a signature) No   I have been provided my out of pocket cost for my medication and approve the pharmacy to charge the amount to my credit card on file. Yes         Completed refill call assessment today to schedule patient's medication shipment from the [facility] and [facility] Delivery Pharm[facility] [phone]).  All relevant notes have been reviewed.       Confirmed patient received a Conservation officer, historic buildings and a Surveyor, mining with first shipment. The patient will receive a drug information handout for each medication shipped and additional FDA Medication Guides as required.         REFERRAL TO PHARMACIST     Referral to the pharmacist: Not needed      [facility]     Shipping address confirmed in Epic.     Delivery Scheduled: Yes, Expected medication delivery date: 8/18.     Medication will be delivered via Same Day Courier to the prescription address in Epic WAM.    Jeoffrey JAYSON Sherra UNK Specialty and [facility]

## 2024-06-24 MED FILL — COSENTYX UNOREADY PEN 300 MG/2 ML SUBCUTANEOUS PEN INJECTOR: SUBCUTANEOUS | 28 days supply | Qty: 2 | Fill #4

## 2024-07-13 ENCOUNTER — Ambulatory Visit
Admission: EM | Admit: 2024-07-13 | Discharge: 2024-07-13 | Disposition: A | Discharge status: Home / Self Care | Attending: Emergency Medicine | Admitting: Emergency Medicine

## 2024-07-13 ENCOUNTER — Encounter: Payer: Self-pay | Admitting: Emergency Medicine

## 2024-07-13 DIAGNOSIS — M436 Torticollis: Secondary | ICD-10-CM | POA: Diagnosis not present

## 2024-07-13 DIAGNOSIS — H9201 Otalgia, right ear: Secondary | ICD-10-CM | POA: Diagnosis not present

## 2024-07-13 DIAGNOSIS — R519 Headache, unspecified: Secondary | ICD-10-CM

## 2024-07-13 MED ORDER — METHOCARBAMOL 500 MG PO TABS
500.0000 mg | ORAL_TABLET | Freq: Two times a day (BID) | ORAL | 0 refills | Status: AC | PRN
Start: 2024-07-13 — End: ?

## 2024-07-13 NOTE — ED Provider Notes (Signed)
 Alice Stephens    CSN: [id number] Arrival date & time: 07/13/24  0818      History   Chief Complaint Chief Complaint  Patient presents with   Headache    HPI Alice Stephens is a 53 y.o. female.  Patient presents with right posterior headache and muscle tension on the right side of her neck at her trapezius muscle.  She states the headache and muscle tension are causing her right ear to hurt.  She has been treating this with ibuprofen ; last dose taken yesterday.  She denies fever, chills, sore throat, cough, shortness of breath, chest pain, numbness, weakness. The history is provided by the patient and medical records.    Past Medical History:  Diagnosis Date   GERD (gastroesophageal reflux disease)    Hidradenitis suppurativa    Hyperlipemia    Hypertension     There are no active problems to display for this patient.   History reviewed. No pertinent surgical history.  OB History   No obstetric history on file.      Home Medications    Prior to Admission medications   Medication Sig Start Date End Date Taking? Authorizing Provider  methocarbamol  (ROBAXIN ) 500 MG tablet Take 1 tablet (500 mg total) by mouth 2 (two) times daily as needed for muscle spasms. 07/13/24  Yes Corlis Burnard DEL, NP  Adalimumab (HUMIRA, 2 PEN,) 80 MG/0.8ML PNKT Inject into the skin. Patient not taking: Reported on 10/30/2023 07/31/23   [provider]  amLODipine (NORVASC) 10 MG tablet Take 10 mg by mouth daily.    [provider]  chlorthalidone (HYGROTON) 25 MG tablet Take 25 mg by mouth daily.    [provider]  metoprolol succinate (TOPROL-XL) 25 MG 24 hr tablet Take 25 mg by mouth daily. Patient not taking: Reported on 10/30/2023    [provider]  naproxen  (NAPROSYN ) 500 MG tablet Take 1 tablet (500 mg total) by mouth 2 (two) times daily with a meal. 08/25/17   Saunders Shona LITTIE, PA-C  omeprazole (PRILOSEC) 40 MG capsule Take 40 mg by mouth daily  as needed (for acid reflux).  Patient not taking: Reported on 10/30/2023    [provider]  potassium chloride  SA (KLOR-CON  M) 20 MEQ tablet Take 20 mEq by mouth 2 (two) times daily.    [provider]  predniSONE (DELTASONE) 20 MG tablet Take 1 tablet by mouth daily. 10/25/23   [provider]  Vitamin D, Ergocalciferol, (DRISDOL) 1.25 MG (50000 UNIT) CAPS capsule Take 50,000 Units by mouth once a week. 10/11/23   [provider]    Family History No family history on file.  Social History Social History   Tobacco Use   Smoking status: Never  Substance Use Topics   Alcohol use: No   Drug use: Never     Allergies   Patient has no known allergies.   Review of Systems Review of Systems  Constitutional:  Negative for chills and fever.  HENT:  Positive for ear pain. Negative for sore throat.   Respiratory:  Negative for cough and shortness of breath.   Cardiovascular:  Negative for chest pain and palpitations.  Musculoskeletal:  Positive for myalgias and neck pain.  Skin:  Negative for color change, rash and wound.  Neurological:  Positive for headaches. Negative for weakness and numbness.     Physical Exam Triage Vital Signs ED Triage Vitals  Encounter Vitals Group     BP 07/13/24 0920 (!) 140/95  Girls Systolic BP Percentile --      Girls Diastolic BP Percentile --      Boys Systolic BP Percentile --      Boys Diastolic BP Percentile --      Pulse Rate 07/13/24 0920 94     Resp 07/13/24 0920 18     Temp 07/13/24 0920 98.3 F (36.8 C)     Temp Source 07/13/24 0920 Oral     SpO2 07/13/24 0920 95 %     Weight 07/13/24 0916 220 lb (99.8 kg)     Height 07/13/24 0916 5' 8 (1.727 m)     Head Circumference --      Peak Flow --      Pain Score 07/13/24 0915 7     Pain Loc --      Pain Education --      Exclude from Growth Chart --    No data found.  Updated Vital Signs BP (!) 140/95 (BP Location: Right Arm)   Pulse 94    Temp 98.3 F (36.8 C) (Oral)   Resp 18   Ht 5' 8 (1.727 m)   Wt 220 lb (99.8 kg)   SpO2 95%   BMI 33.45 kg/m   Visual Acuity Right Eye Distance:   Left Eye Distance:   Bilateral Distance:    Right Eye Near:   Left Eye Near:    Bilateral Near:     Physical Exam Constitutional:      General: She is not in acute distress. HENT:     Right Ear: Tympanic membrane and ear canal normal.     Left Ear: Tympanic membrane and ear canal normal.     Nose: Nose normal.     Mouth/Throat:     Mouth: Mucous membranes are moist.     Pharynx: Oropharynx is clear.  Cardiovascular:     Rate and Rhythm: Normal rate and regular rhythm.     Heart sounds: Normal heart sounds.  Pulmonary:     Effort: Pulmonary effort is normal. No respiratory distress.     Breath sounds: Normal breath sounds.  Musculoskeletal:        General: Tenderness present. No deformity.     Cervical back: Normal range of motion.     Comments: Muscular tension and tenderness of the right trapezius.  Lymphadenopathy:     Cervical: No cervical adenopathy.  Skin:    General: Skin is warm and dry.     Findings: No bruising, erythema, lesion or rash.  Neurological:     General: No focal deficit present.     Mental Status: She is alert and oriented to person, place, and time.     Sensory: No sensory deficit.     Motor: No weakness.     Gait: Gait normal.      UC Treatments / Results  Labs (all labs ordered are listed, but only abnormal results are displayed) Labs Reviewed - No data to display  EKG   Radiology No results found.  Procedures Procedures (including critical care time)  Medications Ordered in UC Medications - No data to display  Initial Impression / Assessment and Plan / UC Course  I have reviewed the triage vital signs and the nursing notes.  Pertinent labs & imaging results that were available during my care of the patient were reviewed by me and considered in my medical decision making (see  chart for details).    Torticollis, headache, right otalgia.  No trauma.  Afebrile.  Instructed patient to continue ibuprofen  as needed.  Treating today with methocarbamol .  Precautions for drowsiness with methocarbamol  discussed.  Education provided on torticollis, headache, earache.  Instructed patient to follow-up with her PCP on Monday.  ED precautions given.  She agrees to plan of care.  Final Clinical Impressions(s) / UC Diagnoses   Final diagnoses:  Torticollis  Acute intractable headache, unspecified headache type  Acute otalgia, right     Discharge Instructions      Take ibuprofen  as needed for discomfort.    Take the muscle relaxer as needed for muscle spasm; Do not drive, operate machinery, or drink alcohol with this medication as it can cause drowsiness.   Follow up with your primary care provider.         ED Prescriptions     Medication Sig Dispense Auth. Provider   methocarbamol  (ROBAXIN ) 500 MG tablet Take 1 tablet (500 mg total) by mouth 2 (two) times daily as needed for muscle spasms. 10 tablet Corlis Burnard DEL, NP      PDMP not reviewed this encounter.   Corlis Burnard DEL, NP 07/13/24 [phone]

## 2024-07-13 NOTE — Discharge Instructions (Addendum)
Take ibuprofen as needed for discomfort.  Take the muscle relaxer as needed for muscle spasm; Do not drive, operate machinery, or drink alcohol with this medication as it can cause drowsiness.   Follow up with your primary care provider.

## 2024-07-13 NOTE — ED Triage Notes (Addendum)
 Patientin office today complaint of headache x 5d with ear ache on right side .Some dizziness off and on    Denies: n/v, chest pain or numbness  OTC: Ibuprofen 

## 2024-07-23 DIAGNOSIS — R0982 Postnasal drip: Principal | ICD-10-CM

## 2024-07-23 DIAGNOSIS — I889 Nonspecific lymphadenitis, unspecified: Principal | ICD-10-CM

## 2024-07-23 MED ORDER — PREDNISONE 20 MG TABLET
ORAL_TABLET | ORAL | 0 refills | 0.00000 days | Status: CP
Start: 2024-07-23 — End: 2025-07-23

## 2024-07-23 NOTE — Unmapped (Signed)
 [facility] Encounter  This medical encounter was conducted virtually using [email or web address]  [email or web address] protocols.    Patient ID: Heather Heather is a 53 y.o. female who presents by video interaction for evaluation.    I have identified myself to the patient and conveyed my credentials to [name].   Patient has signed informed consent on file in medical record.    Present on Video Call: Is there someone else in the room? No..      Assessment/Plan:      Assessment & Plan  Swollen lymph nodes and tonsillar swelling, likely viral etiology    Lymphadenopathy and tonsillar swelling are present without significant sore throat or fever, suggesting a viral infection. The differential diagnosis includes viral causes, with less concern for bacterial infections like strep. COVID-19 is considered due to its recent prevalence. Perform a COVID test today and report results if positive. Prescribe prednisone : 3 tablets once daily for 2 days, then 2 tablets once daily for 2 days, then 1 tablet once daily for 2 days. Advise warm compresses to the neck area and recommend warm saline gargles. If no improvement in 5-7 days, follow up with the primary care provider.    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 She reports increased frequency of viral illnesses and lymphadenopathy since starting Cosentyx . Discuss with a dermatologist about potential alternatives to Cosentyx  due to increased viral infections.    -- Discussed the new prescription noted above, including potential side effects, drug interactions, instructions for taking the medication, and the consequences of not taking it.  -- Patient verbalized an understanding of today's assessment and recommendations, as well as the purpose of ongoing medications.    Follow-up as Needed  and Follow-up with PCP    Medication adherence and barriers to the treatment plan have been addressed. Opportunities to optimize healthy behaviors have been discussed. Patient / caregiver voiced understanding.        Subjective:     HPI  Heather Heather is 53 y.o. and presents today in the [facility] with swollen lymph nodes symptoms.  The PCP for this patient is Heather Levorn Geralds, FNP.     History of Present Illness  Heather Heather is a 53 year old female with hidradenitis suppurativa who presents with swollen lymph nodes and tonsils.    She has experienced significant swelling of her lymph nodes and tonsils, with noticeable drainage since Saturday. She reports being able to feel swelling, especially on one side of her neck. Her son was concerned after hearing her 'throwing so bad' last night. No sore throat unless she moves her neck in a certain way, and no fever. She has not yet taken a COVID test but is at the health department where she can access testing.    She has a history of hidradenitis suppurativa and is currently on Cosentyx . While Cosentyx  is effective for her HS, she notes it seems to increase her susceptibility to viral infections and cause lymph node swelling. She mentions having more frequent colds and viral illnesses since starting the medication.    Prednisone  has been the most effective treatment for her in the past, particularly for reducing inflammation. She feels the need to take it 'like twice a year to get my body back under the inflammation.'    She uses nasal sprays like Flonase  or Nasonex but feels they  cause more drainage. At night, she experiences nasal congestion that requires her to change positions frequently to relieve the blockage.    ROS  Review of Systems     All other ROS per HPI.    I have reviewed the problem list, past medical history, past family history, medications, and allergies and have updated/reconciled them if needed.          Objective:   Physical Exam  As part of this Video Visit, no in-person exam was conducted.  Video interaction permitted the following observations.    General: No acute distress.   HEENT:  EOMI.  No photophobia. No conjunctival injection.    RESP: Relaxed respiratory effort. No conversational dyspnea.   SKIN: No rashes noted.  NEURO: No tremors observed.  Normal speech.   PSYCH: Alert and oriented.  Speech fluent and sensible.  Calm affect.           The patient reports they are physically located in [place]  and is currently: at home. I conducted a audio/video visit. I spent  5m 04s on the video call with the patient. I spent an additional 5 minutes on pre- and post-visit activities on the date of service .         Damaris Check, PA-C  07/23/2024, 8:17 AM

## 2024-07-23 NOTE — Unmapped (Signed)
 The below prescriptions were sent in to your pharmacy:  Prednisone  6 day taper    Please see your PCP in-person in 5-7 days if your symptoms persist, or sooner if they worsen.    If you do not have a PCP, you can go to the below website to check for Primary Care Providers accepting new patients at [facility] Practices:  [email or web address]    Thank you for visiting the [facility].  In the next few days, you will receive a patient experience survey from [employer].   Your feedback is important to us  in improving the experience and care we deliver to you and your family.   Thank you for taking the time to share your experience.     Please email through My Chart or call the below phone number with any questions.    Thanks!  Damaris Check, PA-C  07/23/2024, 8:31 AM    [facility]  Phone: [phone]   Hours: 6am - 10 pm, 7 days a week     MyChart is for NON-URGENT messages.    For EMERGENCIES, please call 911 immediately!

## 2024-07-24 NOTE — Unmapped (Signed)
 [facility] Delivery Pharm[facility] Refill Coordination Note    Specialty Medication(s) to be Shipped:   Inflammatory Disorders: Cosentyx     Other medication(s) to be shipped: No additional medications requested for fill at this time    Specialty Medications not needed at this time: N/A     Heather Watts, DOB: 1971/01/26  Phone: There are no phone numbers on file.      All above HIPAA information was verified with patient.     Was a Nurse, learning disability used for this call? No    Completed refill call assessment today to schedule patient's medication shipment from the [facility]  [phone]).  All relevant notes have been reviewed.     Specialty medication(s) and dose(s) confirmed: Regimen is correct and unchanged.   Changes to medications: [name] reports no changes at this time.  Changes to insurance: No  New side effects reported not previously addressed with a pharmacist or physician: None reported  Questions for the pharmacist: No    Confirmed patient received a Conservation officer, historic buildings and a Surveyor, mining with first shipment. The patient will receive a drug information handout for each medication shipped and additional FDA Medication Guides as required.       DISEASE/MEDICATION-SPECIFIC INFORMATION        For patients on injectable medications: Next injection is scheduled for 07/30/24.    SPECIALTY MEDICATION ADHERENCE     Medication Adherence    Patient reported X missed doses in the last month: 0  Specialty Medication: COSENTYX  UNOREADY PEN 300 mg/2 mL Pnij (secukinumab )  Patient is on additional specialty medications: No  Patient is on more than two specialty medications: No  Any gaps in refill history greater than 2 weeks in the last 3 months: no  Demonstrates understanding of importance of adherence: yes  Informant: patient  Confirmed plan for next specialty medication refill: delivery by pharmacy  Refills needed for supportive medications: not needed          Refill Coordination    Has the Patients' Contact Information Changed: No  Is the Shipping Address Different: No         Were doses missed due to medication being on hold? No    COSENTYX  UNOREADY PEN 300 /2  mg/ml: 0 doses of medicine on hand       REFERRAL TO PHARMACIST     Referral to the pharmacist: Not needed      SHIPPING     Shipping address confirmed in Epic.     Cost and Payment: Patient has a $0 copay, payment information is not required.    Delivery Scheduled: Yes, Expected medication delivery date: 07/30/24.     Medication will be delivered via Same Day Courier to the prescription address in Epic WAM.    Suzen Blood   [facility]  Specialty Technician

## 2024-07-30 MED FILL — COSENTYX UNOREADY PEN 300 MG/2 ML SUBCUTANEOUS PEN INJECTOR: SUBCUTANEOUS | 28 days supply | Qty: 2 | Fill #5

## 2024-08-17 DIAGNOSIS — M25549 Pain in joints of unspecified hand: Principal | ICD-10-CM

## 2024-08-17 DIAGNOSIS — L732 Hidradenitis suppurativa: Principal | ICD-10-CM

## 2024-08-17 MED ORDER — MELOXICAM 7.5 MG TABLET
ORAL_TABLET | Freq: Two times a day (BID) | ORAL | 1 refills | 30.00000 days | Status: CP
Start: 2024-08-17 — End: 2025-08-17

## 2024-08-17 NOTE — Unmapped (Signed)
 [facility] Encounter  This medical encounter was conducted virtually using [email or web address]  [email or web address] protocols.    Patient ID: Heather Watts is a 53 y.o. female who presents by video interaction for evaluation.    I have identified myself to the patient and conveyed my credentials to [name].   Patient has signed informed consent on file in medical record.    Present on Video Call: Is there someone else in the room? No..      Assessment & Plan  Hidradenitis suppurativa with acute hand and ankle swelling and pain, etiology unclear  Acute hand and ankle swelling with pain, possibly due to HS-related inflammation or arthritis. Current treatment with Cosentyx  is ineffective. Prednisone  effective but not suitable for frequent use.  - Prescribed meloxicam 1-2 times daily, max 2 doses/day.  - Advised against concurrent use of meloxicam with other NSAIDs to prevent renal issues.  - Suggested diclofenac cream for joint swelling, cautioning against overuse with meloxicam.  - Instructed to monitor symptoms and contact PCP if no improvement.  - Sent prescription to [facility] in [place].      -- Discussed the new prescription noted above, including potential side effects, drug interactions, instructions for taking the medication, and the consequences of not taking it.  -- Patient verbalized an understanding of today's assessment and recommendations, as well as the purpose of ongoing medications.    Follow-up as Needed         Medication adherence and barriers to the treatment plan have been addressed. Opportunities to optimize healthy behaviors have been discussed. Patient / caregiver voiced understanding.          History of Present Illness  Heather Watts is a 53 year old female with hidradenitis suppurativa who presents with joint swelling and pain.    She has been experiencing swelling and pain in her hand and ankle for the past three days. The swelling is accompanied by pain, especially when using her hand. No recent injury to these areas is reported. Swelling is also present in her groin, which she associates with her known diagnosis of hidradenitis suppurativa.    Her hidradenitis suppurativa is hormonally influenced and currently experiencing a flare-up, which she attributes to menopause. She has an upcoming appointment with a dermatologist on November 4th to discuss her condition further.    Her current medication regimen includes Cosentyx , which she feels is not adequately controlling her symptoms. She has previously used prednisone , which she finds effective in reducing inflammation. For pain management, she takes ibuprofen  every six to eight hours, using the strongest available over-the-counter dose of 200 mg.    She has not undergone any specific testing for arthritis and has not experienced similar swelling in her hands before. No kidney issues, and her kidney function was last checked in June.        Physical Exam  As part of this Video Visit, no in-person exam was conducted.  Video interaction permitted the following observations.    General: No acute distress.   HEENT:  EOMI.  No photophobia. No conjunctival injection.    RESP: Relaxed respiratory effort. No conversational dyspnea.   SKIN: No rashes noted.  NEURO: No tremors observed.  Normal speech.   PSYCH: Alert and oriented.  Speech fluent and sensible.  Calm affect.           The patient reports they are physically located in [place]  and is currently: at home. I conducted a audio/video visit. I spent  49m 40s on the video call with the patient. I spent an additional 1 minutes on pre- and post-visit activities on the date of service .          SABRA

## 2024-08-17 NOTE — Unmapped (Signed)
 VISIT SUMMARY:  During your visit, we discussed the swelling and pain in your hand and ankle, as well as the flare-up of your hidradenitis suppurativa. We reviewed your current medications and made adjustments to better manage your symptoms.    YOUR PLAN:  -HIDRADENITIS SUPPURATIVA: Hidradenitis suppurativa is a chronic skin condition characterized by small, painful lumps under the skin. Your condition is currently experiencing a flare-up, likely due to hormonal changes associated with menopause. You have an upcoming appointment with a dermatologist to discuss this further.    -ACUTE HAND AND ANKLE SWELLING AND PAIN: The swelling and pain in your hand and ankle may be related to inflammation from hidradenitis suppurativa or possibly arthritis. We have prescribed meloxicam to help reduce inflammation and pain. Please take it 1-2 times daily, with a maximum of 2 doses per day. Avoid using other NSAIDs like ibuprofen  while taking meloxicam to prevent kidney issues. Additionally, you can use diclofenac cream on the swollen joints, but be cautious not to overuse it with meloxicam. Monitor your symptoms and contact your primary care provider if there is no improvement.    INSTRUCTIONS:  Please follow up with your dermatologist on November 4th as scheduled. If your symptoms do not improve, contact your primary care provider for further evaluation.

## 2024-08-27 MED FILL — COSENTYX UNOREADY PEN 300 MG/2 ML SUBCUTANEOUS PEN INJECTOR: SUBCUTANEOUS | 28 days supply | Qty: 2 | Fill #6

## 2024-08-27 NOTE — Unmapped (Signed)
 [facility] Home Delivery Pharm[facility] Refill Coordination Note    Heather Watts, [place]: [birth year]  Phone: There are no phone numbers on file.      All above HIPAA information was verified with patient.         08/21/2024     8:03 AM   Specialty Rx Medication Refill Questionnaire   Which Medications would you like refilled and shipped? Cosentyx    Please list all current allergies: None   Have you missed any doses in the last 30 days? No   Have you had any changes to your medication(s) since your last refill? No   How much of each medication do you have remaining at home? (eg. number of tablets, injections, etc.) none   Have you experienced any side effects in the last 30 days? No   Please enter the full address (street address, city, state, zip code) where you would like your medication(s) to be delivered to. [address] , [address], [place], [place]   Please specify on which day you would like your medication(s) to arrive. Note: if you need your medication(s) within 3 days, please call the pharmacy to schedule your order at [phone]  08/27/2024   Has your insurance changed since your last refill? No   Would you like a pharmacist to call you to discuss your medication(s)? No   Do you require a signature for your package? (Note: if we are billing Medicare Part B or your order contains a controlled substance, we will require a signature) No   I have been provided my out of pocket cost for my medication and approve the pharmacy to charge the amount to my credit card on file. Yes         Completed refill call assessment today to schedule patient's medication shipment from the [facility] [phone]).  All relevant notes have been reviewed.       Confirmed patient received a Conservation officer, historic buildings and a Surveyor, mining with first shipment. The patient will receive a drug information handout for each medication shipped and additional FDA Medication Guides as required. REFERRAL TO PHARMACIST     Referral to the pharmacist: Not needed      [facility]     Shipping address confirmed in Epic.     Delivery Scheduled: Yes, Expected medication delivery date: 10/21.     Medication will be delivered via Same Day Courier to the prescription address in Epic WAM.    [facility] Specialty Technician

## 2024-09-10 DIAGNOSIS — R0982 Postnasal drip: Principal | ICD-10-CM

## 2024-09-10 DIAGNOSIS — I889 Nonspecific lymphadenitis, unspecified: Principal | ICD-10-CM

## 2024-09-10 DIAGNOSIS — L732 Hidradenitis suppurativa: Principal | ICD-10-CM

## 2024-09-10 MED ORDER — IBUPROFEN 800 MG TABLET
Freq: Three times a day (TID) | ORAL | 0.00000 days | Status: CN
Start: 2024-09-10 — End: ?

## 2024-09-10 MED ORDER — COSENTYX PEN 300 MG/2 PENS (150 MG/ML) SUBCUTANEOUS PEN INJECTOR
SUBCUTANEOUS | 11 refills | 0.00000 days | Status: CP
Start: 2024-09-10 — End: 2024-09-10
  Filled 2024-10-02: qty 4, 28d supply, fill #0

## 2024-09-10 MED ORDER — PREDNISONE 20 MG TABLET
ORAL_TABLET | 0 refills | 0.00000 days | Status: CN
Start: 2024-09-10 — End: 2025-09-10

## 2024-09-10 MED ORDER — SECUKINUMAB 300 MG/2 ML SUBCUTANEOUS PEN INJECTOR
SUBCUTANEOUS | 11 refills | 0.00000 days | Status: CP
Start: 2024-09-10 — End: ?

## 2024-09-10 MED ADMIN — triamcinolone acetonide (KENALOG-40) injection 40 mg: 40 mg | @ 16:00:00 | Stop: 2024-09-10

## 2024-09-10 NOTE — Progress Notes (Signed)
 DERMATOLOGY CLINIC NOTE    ASSESSMENT AND PLAN:     Hidradenitis suppurativa, Hurley stage III, inadequate response to 300 mg every 4 weeks of Cosentyx :  - secukinumab  (COSENTYX  PEN, 2 PENS,) 150 mg/mL PnIj injection; Inject 300 mg (2 pens) under skin every 14 days.  Dispense: 4 mL; Refill: 11  After the patient was informed of risks, benefits and side effects of intralesional steroid injection, the patient elected to undergo injection. Informed verbal consent was obtained. Risk of atrophy  and dyspigmentation with injection was explained. Kenalog 10 mg/ml was injected locally into the sites located inframammary in a clean fashion following alcohol prep.   Total volume in ml=1.  Number of sites treated: 2.   Wound care was explained to the patient  - Discussed could consider surgical options for the lesion under her breasts in the future but need to optimize medical therapy first.     Return to clinic: 4 months       CHIEF COMPLAINT:  Follow-up hidradenitis     HPI:   This is a pleasant 53 y.o. female who last saw me in February 2025.  At that time we had made the decision to start Zoladex for hidradenitis which at that time was stage III.  QuantiFERON test at that time was negative.  We also did an incision and drainage at last visit.  Prior treatments include Humira .    She is coming in for follow-up today.  Currently draining under the right breast.  Patient and axillas have improved dramatically since starting the Cosentyx .  Still pain and draining inframammary lesions.     PAST MEDICAL HISTORY:  Hidradenitis suppurativa, stage III, prior treatments include Humira , Bevans LX     Gastroesophageal reflux disease  Hyperlipidemia  Obesity    MEDICATIONS:   Medications Ordered Prior to Encounter[1]    ALLERGIES:   No known allergies    SOCIAL HISTORY:  Lives in [place]     REVIEW OF SYSTEMS:  Baseline state of health. No recent illnesses. No other skin complaints.     PHYSICAL EXAMINATION:  Examination in the presence of chaperone:  General: Well-developed, well-nourished. No acute distress.   Neuro: Alert and oriented, answers questions appropriately.  Skin: Focused examination of axilla, inframammary was performed and notable for the following:  Scarring extensive under bilateral axilla  Draining nodules inframammary     Dictation software was used while making this note. Please excuse any errors made with dictation software.       [1]   Current Outpatient Medications on File Prior to Visit   Medication Sig Dispense Refill    amlodipine  (NORVASC ) 10 MG tablet Take 1 tablet (10 mg total) by mouth daily. TAKE 1 TABLET(10 MG) BY MOUTH DAILY 90 tablet 1    chlorthalidone  (HYGROTON ) 25 MG tablet Take 1 tablet (25 mg total) by mouth daily. TAKE 1 TABLET(25 MG) BY MOUTH DAILY 90 tablet 1    doxycycline  (MONODOX ) 100 MG capsule Take one capsule by mouth twice daily for 10 days. (Patient not taking: Reported on 04/16/2024) 20 capsule 2    empty container Misc Use as directed to dispose of needles. When full, make sure lid is closed tightly then dispose of container in trash. 1 each 2    ergocalciferol -1,250 mcg, 50,000 unit, (DRISDOL ) 1,250 mcg (50,000 unit) capsule Take 1 capsule (1,250 mcg total) by mouth once a week. (Patient not taking: Reported on 04/16/2024) 4 capsule 2    ipratropium (ATROVENT ) 21 mcg (0.03 %)  nasal spray 1 spray into each nostril Three (3) times a day as needed for rhinitis. 30 mL 0    meloxicam (MOBIC) 7.5 MG tablet Take 1 tablet (7.5 mg total) by mouth two (2) times a day. 60 tablet 1    potassium chloride  20 MEQ ER tablet Take 1 tablet (20 mEq total) by mouth daily. 90 tablet 1    predniSONE  (DELTASONE ) 20 MG tablet Take 60mg  (3 tabs) for 2 days, 40mg  (2 tabs) for 2 days, then 20 mg (one tab) for 2 days 12 tablet 0    secukinumab  (COSENTYX  UNOREADY PEN) 300 mg/2 mL PnIj Inject the contents of 1 pen (300mg ) under the skin every 28 days as maintenance 2 mL 11    silver  sulfADIAZINE  (SILVADENE , SSD) 1 % cream Apply to affected area daily 50 g 1     No current facility-administered medications on file prior to visit.

## 2024-09-10 NOTE — Patient Instructions (Addendum)
 Meet your team:     Your intake nurse is: Bruno     Please remember to fill out the survey you will receive after your visit. Your comments help us  continue to improve our care.      Thanks in advance!      [facility] Dermatology Clinical Staff     Patient Education        Hidradenitis Suppurativa: Care Instructions  Overview     Hidradenitis suppurativa (say hih-drad-uh-NY-tus sup-yur-uh-TY-vuh) is a skin condition that causes lumps on the skin that look like pimples or boils. The lumps are usually painful and can break open and drain blood and bad-smelling pus. The condition can come and go for many years.  Treatment for this condition may include antibiotics and other medicines. You may need surgery to remove the lumps. Home care includes wearing loose-fitting clothes and washing the area gently. You can help prevent lumps from coming back by staying at a healthy weight and not smoking.  Doctors don't know exactly how this condition starts. But they do know that something irritates and inflames the hair follicles, causing them to swell and form lumps. This skin condition can't be spread from person to person (isn't contagious).  Follow-up care is a key part of your treatment and safety. Be sure to make and go to all appointments, and call your doctor if you are having problems. It's also a good idea to know your test results and keep a list of the medicines you take.  How can you care for yourself at home?  Skin care    Wash the area every day with mild soap. Use your hands rather than a washcloth or sponge when you wash that part of your body.     Leave the affected areas uncovered when you can. If you have lumps that are draining, you can cover them with a bandage or other dressing. Put petroleum jelly (such as Vaseline) on the dressing to help keep it from sticking.     Wear-loose fitting clothes that don't rub against the area. Avoid activities that cause skin to rub together.     If you have pain, try a warm compress. Soak a towel or washcloth in warm water, wring it out, and place it on the affected skin for about 10 minutes.   Medicines    Be safe with medicines. Take your medicines exactly as prescribed. Call your doctor if you think you are having a problem with your medicine. You will get more details on the specific medicines your doctor prescribes.     If your doctor prescribed antibiotics, take them as directed. Do not stop taking them just because you feel better. You need to take the full course of antibiotics.   Lifestyle choices    If you smoke, think about quitting. Smoking can make the condition worse. If you need help quitting, talk to your doctor about stop-smoking programs and medicines. These can increase your chances of quitting for good.     Stay at a healthy weight, or lose weight, by eating healthy foods and being physically active. Being overweight could make this condition worse.   When should you call for help?   Call your doctor now or seek immediate medical care if:    You have symptoms of infection, such as:  Increased pain, swelling, warmth, or redness.  Red streaks leading from the area.  Pus draining from the area.  A fever.   Watch closely for changes  in your health, and be sure to contact your doctor if:    You do not get better as expected.   Where can you learn more?  Go to [facility] at https://my[facility]chart.[email or web address]/support/administrative Officer in the Menu. Enter [phone] in the search box to learn more about Hidradenitis Suppurativa: Care Instructions.  Current as of: October 11, 2023  Content Version: 14.6  ?? 2024-2025 [place], [place].   Care instructions adapted under license by [facility]. If you have questions about a medical condition or this instruction, always ask your healthcare professional. Romayne Alderman, [facility], disclaims any warranty or liability for your use of this information.     You are welcome to join the [facility] for HS of the [employer] group online at [email or web address] or in-person at our regular meetings.  You can textHS to [phone] for meeting reminders or join the group online for regular updates.  This can be a great opportunity to interact and learn from other patients and help work with the HS community.  We hope to see your there!    Hidradentis Suppurativa (pronounced ???high-drad-en-eye-tis/sup-your-uh-tee-vah???) is a chronic disease of hair follicles.  The lesions occur most commonly on areas of skin-to-skin contact: under the arms (axillary area), in the groin, around the buttocks, in the region around the anus and genitals, and on the skin between and under the breasts. In women, the underarms, groin, and breast areas are most commonly affected. Men most often have HS lesions on the buttocks and under the arms and may also have HS at the back of the neck and behind and around the ears.    What does HS look and feel like?   The first thing that someone with HS notices is a tender, raised, red bump that looks like an under-the-skin pimple or boil. Sometimes HS lesions have two or more ???heads.???  In mild disease only an occasional boil or abscess may occur, but in more active disease there can be many new lesions every month.  Some abscesses can become larger and may open and drain pus.  Bleeding and increased odor can also occur. In severe disease, deeper abscesses develop and may connect with each other under the skin to form tunnel-like tracts (sinuses, fistulas).  These may drain constantly, or may temporarily improve and then usually begin draining again over time.  In people who have had sinus tracts for some time, scars form that feel like ropes under the skin. In the very worst cases, networks of sinus tracts can form deeper in the body, including the muscle and other tissues. Many people with severe HS have scars that can limit their ability to freely move their arms or legs, though this is very unlikely for most patients. Clinicians usually classify or ???grade??? HS using the [facility] staging system according to the severity of the disease for each body location:   [place] stage I: one or more abscesses are present, but no sinus tracts have formed and no scars have developed   [place] stage II: one or more abscesses are present that resolve and recur; on sinus tract can be present and scarring is seen   [place] stage III: many abscesses and more than one sinus tract is present with extensive scars.    What causes HS?  The cause of HS is not completely understood.  It seems to be a disorder of hair follicles and often many family members are affected so genetics probably play a strong role.  Bacteria are often present and may make the disease worse, but infection does not seem to be the main cause. Hormones are also likely play a role since the condition typically starts around puberty when hair follicles under the arms and in the groin start to change.  It can sometimes flare with menstrual cycles in women as well.  In most cases it lasts for decades and starts to improve to some extent in the late 30s and 40s as long as many fistulas have not already formed.  Women are three times more likely than men to develop HS.    Other factors are known to contribute to HS flaring or becoming worse, though they are likely not the main causes. The factors most commonly associated with HS include:   Cigarette smoking - Stopping smoking will likely not cure the disease, but likely is helpful in reducing how much and how often it flares and may prevent it from getting as bad over time.   Higher weight - HS may occur even in people that are not overweight, but it is much more common in patients that are.  There is some evidence that losing weight and eating a diet low in sugars and fats may be helpful in improving hidradenitis, though this is not helpful for everyone.  Working with a nutritionist may be an important way to help with this and is something your physician can help coordinate    Hidradenitis is not contagious.  It is not caused by a problem with personal hygiene or any other activity or behavior of those with the disease.    How can your doctor help you treat your hidradenitis?  Clinicians use both medication and surgery to treat HS. The choice of treatment--or combination of treatments--is made according to an individual patient???s needs. Clinicians consider several factors in determining the most appropriate plan for therapy:   Severity of disease - medications and some laser treatments are usually able to control disease best when fistulas are not present.  Fistulas typically require surgery.   Extent and location of disease   Chronicity (how often the lesions recur)    A number of different surgical methods have been developed that are useful for certain patients under particular circumstances. These can be done with local numbing and healing at home for some areas when disease is not too extensive with relatively brief recovery times.  In more extensive disease there may be a need for larger excisions under general anesthesia with healing time in the hospital and prolonged recovery periods for better disease control.      In addition, many medical treatments have been tried--some with more success than others. No medication is effective for all patients, and you and your doctor may have to try several different treatments or combinations of treatments before you find the treatment plan that works best for you.  The goals of therapy with medications that are either topical (used on the skin) or systemic (taken by mouth) are:  1. to clear the lesions or at least reduce their number and extent, and  2. to prevent new lesions from forming.  3. To reduce pain, drainage, and odor  Some of the types of medications commonly used are antibacterial skin washes and the topical antibiotics to prevent secondary infections and corticosteroid injections into the lesions to reduce inflammation.     Other medications that may be used include retinoids (similar to Accutane), drugs that effect how hormones and hair follicles interact, drugs  that affect your immune system (such as methotrexate, adalimumab /Humira , and Remicaid/infliximab), steroids, and oral antibiotics.    Lasers that destroy hair follicles can also be helpful since they reduce the hair follicles that cause the problems.  Multiple treatments are typically required over time and there is some discomfort associated with treatment, but it is typically very fast and well-tolerated.    It is very important to realize that hidradenitis cannot usually be completely cured with any single medication or surgical procedure.  It is a disease that can be very stubborn and difficult to control, but with good treatment a lot of improvement and sometimes temporary remissions can be obtained. Poorly controlled disease can cause more fistulas to form and make managing the disease much more difficult over time so it is important to seek care to reduce major flares.  Surgery can provide a long term cure in some areas, though the disease can start again or continue in nearby areas.  A dermatologist is often the best person to help coordinate disease treatment, and sometimes other surgeons, pain specialists, other specialists, and nutritionists may be part of the treatment team.    For severe disease, the first goal is often to reduce pain and symptoms with medicines so that the disease feels more stable. Once it's stable, we often start thinking about how to address areas that have completely gotten better with surgery if they are still causing problems.    What can you do to help your HS?  1. Stopping smoking is hard and may not fix everything, but it may be a step in the right direction.  We or your primary care physician can provide resources to help stop if you are interested.  2. Follow a healthy diet and try to achieve a healthy weight.  Some other self-help measures are:   Keep your skin cool and dry (becoming overheated and sweating can contribute to an HS flare)   To reduce the pain of cysts or nodules or to help them to drain, apply hot compresses or soak in hot water for 10 minutes at a time (use a clean washcloth or a teabag soaked in hot water)   For female patients, cotton underwear that does not have tight elastic in the groin can be helpful.  Boyshort, brief, or boxer style underwear may be a better option as friction on hair follicles in affected areas can be a major trigger in some patients.  These can be easily found on Amazon or with some retailers.  Fruit of the Loom and Underworks are two brands that are sometimes recommended.    Finally, know that you are not alone. Coping with the pain and other symptoms of HS can be very difficult, so it may be helpful to connect with others who live with HS. Patient groups and networks can be sources of important information and support. Some internet resources for information and connections are provided below.    Psychologytoday.com is a resource to find psychologists and therapists that can help support you in your are     [email or web address] can help connect with sexual health resources and counselors    Resources for Information    The Hidradenitis Suppurativa Foundation: A nonprofit organized by a group of physicians interested in treating and advancing research in hidradenitis suppurativa.  This group advocates for better care and research for hidradenitis and has educational materials put together specifically for patients that have been reviewed and produced by doctors and people with hidradenitis.  American Academy of Dermatology  aranked.fi    [employer]  elevatorpitchers.de.html  [employer]  https://www.rarediseases.org/rare-disease-information/rare-diseases/byID/358/viewAbstract  Trials of new medications for HS  Https://www.clinicaltrials.gov

## 2024-09-10 NOTE — Progress Notes (Signed)
 Clinical Assessment Needed For: Dose Change  Medication: COSENTYX  UNOREADY PEN 300 mg/2 mL Pnij (secukinumab )  Last Fill Date/Day Supply: 08/27/24 / 28 days  Prior Authorization Required  Was previous dose already scheduled to fill: No    Notes to Pharmacist: PA pending

## 2024-09-10 NOTE — Addendum Note (Signed)
 Addended by: Tyra Gural R on: 09/10/2024 03:32 PM     Modules accepted: Orders

## 2024-09-19 NOTE — Progress Notes (Signed)
 [facility] and Home Delivery Pharmacy Refill Coordination Note    Heather Watts, [place]: 1971/07/29  Phone: There are no phone numbers on file.      All above HIPAA information was verified with patient.         09/17/2024     1:44 PM   Specialty Rx Medication Refill Questionnaire   Which Medications would you like refilled and shipped? Cosentyx    Please list all current allergies: None   Have you missed any doses in the last 30 days? No   Have you had any changes to your medication(s) since your last refill? No   How much of each medication do you have remaining at home? (eg. number of tablets, injections, etc.) None   If receiving an injectable medication, next injection date is 09/27/2024   Have you experienced any side effects in the last 30 days? No   Please enter the full address (street address, city, state, zip code) where you would like your medication(s) to be delivered to. [address] [place] [address]   Please specify on which day you would like your medication(s) to arrive. Note: if you need your medication(s) within 3 days, please call the pharmacy to schedule your order at [phone]  09/23/2024   Has your insurance changed since your last refill? No   Would you like a pharmacist to call you to discuss your medication(s)? No   Do you require a signature for your package? (Note: if we are billing Medicare Part B or your order contains a controlled substance, we will require a signature) No   I have been provided my out of pocket cost for my medication and approve the pharmacy to charge the amount to my credit card on file. Yes         Completed refill call assessment today to schedule patient's medication shipment from the [facility] [phone]).  All relevant notes have been reviewed.       Confirmed patient received a Conservation Officer, Historic Buildings and a Surveyor, Mining with first shipment. The patient will receive a drug information handout for each medication shipped and additional FDA Medication Guides as required.         REFERRAL TO PHARMACIST     Referral to the pharmacist: Not needed      [facility]     Shipping address confirmed in Epic.     Delivery Scheduled: Yes, Expected medication delivery date: 09/23/24.     Medication will be delivered via Same Day Courier to the prescription address in Epic WAM.    Heather Watts   [facility] Delivery Pharm[facility] Specialty Technician

## 2024-09-23 NOTE — Progress Notes (Signed)
 Bascom Macario Fuse 's COSENTYX  UNOREADY PEN 300 mg/2 mL Pnij (secukinumab ) shipment will be delayed as a result of the patient's insurance plan being in grace period (patient needs to pay insurance premium).     I have reached out to the patient  at [phone] and communicated the delay. We will wait for a call back from the patient to reschedule the delivery.  We have not confirmed the new delivery date.

## 2024-09-25 NOTE — Progress Notes (Signed)
 Heather Watts 's COSENTYX  UNOREADY PEN 300 mg/2 mL Pnij (secukinumab ) shipment will be rescheduled as a result of the patient's insurance plan being in grace period (patient needs to pay insurance premium). (Pt stated she will pay on Friday)    I have reached out to the patient  at [phone] and communicated the delivery change. We will reschedule the medication for the delivery date that the patient agreed upon.  We have confirmed the delivery date as 10/02/24

## 2024-10-02 NOTE — Progress Notes (Signed)
 Bascom Macario Fuse 's COSENTYX  UNOREADY PEN 300 mg/2 mL Pnij (secukinumab ) shipment will be rescheduled as a result of the patient is no longer in their grace period.     I have reached out to the patient  at [phone] and communicated the delivery change. We will reschedule the medication for the delivery date that the patient agreed upon.  We have confirmed the delivery date as 10/02/24

## 2024-10-02 NOTE — Progress Notes (Signed)
 [name] 's [id number] shipment will be delayed as a result of the patient's insurance plan being in grace period (patient needs to pay insurance premium).     I have reached out to the patient  at [phone] and communicated the delay. We will wait for a call back from the patient to reschedule the delivery.  We have not confirmed the new delivery date.

## 2024-10-09 DIAGNOSIS — I10 Essential (primary) hypertension: Principal | ICD-10-CM

## 2024-10-09 DIAGNOSIS — E876 Hypokalemia: Principal | ICD-10-CM

## 2024-10-09 MED ORDER — AMLODIPINE 10 MG TABLET
ORAL_TABLET | Freq: Every day | ORAL | 3 refills | 90.00000 days
Start: 2024-10-09 — End: 2025-10-08

## 2024-10-09 MED ORDER — POTASSIUM CHLORIDE ER 20 MEQ TABLET,EXTENDED RELEASE(PART/CRYST)
ORAL_TABLET | Freq: Every day | ORAL | 3 refills | 90.00000 days
Start: 2024-10-09 — End: 2025-10-08

## 2024-10-09 NOTE — Progress Notes (Unsigned)
 TeleHealth Video Encounter  This visit is conducted via programmer, applications.    Patient is currently located in the state of [place] .  I have identified myself to the patient and conveyed my credentials to Ms. Artz  I have explained the capabilities and limitations of telemedicine and the patient and myself both agree that it is appropriate for their current circumstances/symptoms.  In case we get disconnected, patient's phone number is There are no phone numbers on file.   Patient has signed informed consent on file in medical record.  Is there someone else in the room? No.       Assessment and Plan:      Diagnosis ICD-10-CM Associated Orders   1. Hypokalemia  E87.6       2. Essential hypertension  I10           Assessment & Plan          No follow-ups on file.    Total time spent with patient:   {    Coding tips - Do not edit this text, it will delete upon signing of note!    Telephone visits [phone] for Physicians and APPs and [phone] for Non- Physician Clinicians)- Only use minutes on the phone to determine level of service.    Video visits [phone] - Use either level of medical decision making just as an in-person visit OR time which includes both minutes on video and pre/post minutes to determine the level of service.      :75688}  The patient reports they are physically located in [place]  and is currently: {patient location:[address]}. I conducted a {phone audio video visit:101857} .       Subjective:       Heather Watts is a 53 y.o. female who presents for   Chief Complaint   Patient presents with    Joint Pain     Joint pain.    Colon Cancer Screening     She has Cologuard at home and she will do it.    Breast cancer screening     Declines Mammo testing due to Hidradenitis suppurativa.    Flu Vaccine     Does not get them.         History of Present Illness             ROS:      Comprehensive 10 point ROS negative unless otherwise stated in the HPI.      [facility] Components: Medication adherence and barriers to the treatment plan have been addressed. Opportunities to optimize healthy behaviors have been discussed. Patient / caregiver voiced understanding.      Past Medical/Surgical History:     Past Medical History[1]  Past Surgical History[2]    Family History:     Family History[3]    Social History:     Short Social History[4]    Allergies:     Patient has no known allergies.    Current Medications:     Current Medications[5]    Health Maintenance:     Health Maintenance   Topic Date Due    HPV Cotest with Pap Smear (21-65)  Never done    Mammogram  Never done    Pap Smear with Cotest HPV (21-65)  02/23/2014    Colon Cancer Screening  Never done    COVID-19 Vaccine (3 - Pfizer risk series) 11/30/2020    Influenza Vaccine (1) 07/08/2024    Pneumococcal Vaccine 50+ (1 of 2 -  PCV) 10/16/2024 (Originally 12/07/1991)    Serum Creatinine Monitoring  04/16/2025    Potassium Monitoring  04/16/2025    DTaP/Tdap/Td Vaccines (5 - Td or Tdap) 07/04/2028    Lipid Screening  04/16/2029    Hepatitis C Screen  Completed    Zoster Vaccines  Discontinued       Immunizations:     Immunization History   Administered Date(s) Administered    COVID-19 VACC,MRNA,(PFIZER)(PF) 06/20/2020, 11/02/2020    HEPATITIS B VACCINE ADULT,IM(ENERGIX B, RECOMBIVAX) 07/04/2018, 08/01/2018, 12/06/2018    INFLUENZA TIV (TRI) PF (IM)(HISTORICAL) 12/09/2009    Influenza Vaccine Quad (IIV4 W/PRESERV) 7MO+ 08/27/2021    Influenza Vaccine Quad(IM)6 MO-Adult(PF) 08/15/2018, 08/13/2019    Influenza Virus Vaccine, unspecified formulation 09/10/2010, 09/07/2012, 08/27/2013, 08/15/2018, 08/13/2019, 08/22/2020    MMR 05/26/2008, 07/11/2008    TD, ADSORBED, 5LF TETANUS TOXOID, PF(TENIVAC/DECAVAC) 07/11/2008, 07/04/2018    Td (adult) unspecified formulation 05/14/2008    TdaP 05/14/2008     I have reviewed and (if needed) updated the patient's problem list, medications, allergies, past medical and surgical history, social and family history.     Vital Signs:     Wt Readings from Last 3 Encounters:   04/16/24 (!) 105.7 kg (233 lb)   09/24/22 (!) 102.1 kg (225 lb)   07/20/22 (!) 105.2 kg (232 lb)     Temp Readings from Last 3 Encounters:   04/16/24 36.6 ??C (97.9 ??F) (Oral)   09/24/22 36.9 ??C (98.4 ??F) (Oral)   07/20/22 37.1 ??C (98.8 ??F) (Oral)     BP Readings from Last 3 Encounters:   04/16/24 138/88   09/24/22 123/99   07/20/22 116/66     Pulse Readings from Last 3 Encounters:   04/16/24 96   09/24/22 81   07/20/22 94     Estimated body mass index is 35.43 kg/m?? as calculated from the following:    Height as of 04/16/24: 172.7 cm (5' 8).    Weight as of 04/16/24: 105.7 kg (233 lb).  No height and weight on file for this encounter.        Objective:      As part of this Video Visit, no in-person exam was conducted.  Video interaction permitted the following observations.    GEN: No acute distress.   SKIN: Color is normal. No rashes.  PSYCH: Appropriate affect, normal mood  RESP: Normal work of breathing, no retractions       Levorn Snide, FNP-C           [1]   Past Medical History:  Diagnosis Date    COVID-19 11/06/2020    GERD (gastroesophageal reflux disease)     Hidradenitis suppurativa 2015    Hyperlipidemia     Hypertension     Obesity slightly   [2] No past surgical history on file.  [3]   Family History  Problem Relation Age of Onset    Cancer Mother     Hypertension Mother         Everyone on mother's side of family    COPD Father     Hyperlipidemia Father     Hypertension Father     Cancer Maternal Aunt     Cancer Maternal Uncle     Early death Maternal Uncle         Heart Attack    Aneurysm Maternal Uncle     Stroke Maternal Grandmother     Cancer Son     Stroke Sister     Hypertension  Sister     Cancer Daughter     Melanoma Neg Hx     Basal cell carcinoma Neg Hx     Squamous cell carcinoma Neg Hx    [4]   Social History  Tobacco Use    Smoking status: Former     Current packs/day: 0.00     Average packs/day: 0.5 packs/day for 14.0 years (7.0 ttl pk-yrs)     Types: Cigarettes     Start date: 03/19/1991     Quit date: 03/18/2005     Years since quitting: 19.5     Passive exposure: Never    Smokeless tobacco: Never    Tobacco comments:     Quit smoking 2005   Vaping Use    Vaping status: Never Used   Substance Use Topics    Alcohol use: Not Currently    Drug use: Never   [5]   Current Outpatient Medications   Medication Sig Dispense Refill    amlodipine  (NORVASC ) 10 MG tablet Take 1 tablet (10 mg total) by mouth daily. TAKE 1 TABLET(10 MG) BY MOUTH DAILY 90 tablet 1    chlorthalidone  (HYGROTON ) 25 MG tablet Take 1 tablet (25 mg total) by mouth daily. TAKE 1 TABLET(25 MG) BY MOUTH DAILY 90 tablet 1    empty container Misc Use as directed to dispose of needles. When full, make sure lid is closed tightly then dispose of container in trash. 1 each 2    meloxicam  (MOBIC ) 7.5 MG tablet Take 1-2 tablets daily prn      potassium chloride  20 MEQ ER tablet Take 1 tablet (20 mEq total) by mouth daily. 90 tablet 1    secukinumab  300 mg/2 mL PnIj Inject the contents of 1 pen (300 mg) under skin every 14 days. 4 mL 11    ergocalciferol -1,250 mcg, 50,000 unit, (DRISDOL ) 1,250 mcg (50,000 unit) capsule Take 1 capsule (1,250 mcg total) by mouth once a week. (Patient not taking: Reported on 10/09/2024) 4 capsule 2    predniSONE  (DELTASONE ) 20 MG tablet Take 60mg  (3 tabs) for 2 days, 40mg  (2 tabs) for 2 days, then 20 mg (one tab) for 2 days 12 tablet 0    silver  sulfADIAZINE  (SILVADENE , SSD) 1 % cream Apply to affected area daily 50 g 1     No current facility-administered medications for this visit.

## 2024-10-10 MED ORDER — MELOXICAM 7.5 MG TABLET
0.00000 days
Start: 2024-10-10 — End: ?

## 2024-10-10 MED ORDER — POTASSIUM CHLORIDE ER 20 MEQ TABLET,EXTENDED RELEASE(PART/CRYST)
ORAL_TABLET | Freq: Every day | ORAL | 3 refills | 90.00000 days | Status: CP
Start: 2024-10-10 — End: 2025-10-09

## 2024-10-10 MED ORDER — AMLODIPINE 10 MG TABLET
ORAL_TABLET | Freq: Every day | ORAL | 3 refills | 90.00000 days | Status: CP
Start: 2024-10-10 — End: 2025-10-09

## 2024-10-10 NOTE — Progress Notes (Signed)
 I don't see any orders.  You have never filled Meloxicam .  Pulled up what she said she takes.

## 2024-10-11 LAB — CBC W/ DIFFERENTIAL
BANDED NEUTROPHILS ABSOLUTE COUNT: 0 x10E3/uL (ref 0.0–0.1)
BASOPHILS ABSOLUTE COUNT: 0 x10E3/uL (ref 0.0–0.2)
BASOPHILS RELATIVE PERCENT: 0 %
EOSINOPHILS ABSOLUTE COUNT: 0.3 x10E3/uL (ref 0.0–0.4)
EOSINOPHILS RELATIVE PERCENT: 3 %
HEMATOCRIT: 38.8 % (ref 34.0–46.6)
HEMOGLOBIN: 11.8 g/dL (ref 11.1–15.9)
IMMATURE GRANULOCYTES: 0 %
LYMPHOCYTES ABSOLUTE COUNT: 2.7 x10E3/uL (ref 0.7–3.1)
LYMPHOCYTES RELATIVE PERCENT: 29 %
MEAN CORPUSCULAR HEMOGLOBIN CONC: 30.4 g/dL — ABNORMAL LOW (ref 31.5–35.7)
MEAN CORPUSCULAR HEMOGLOBIN: 24.3 pg — ABNORMAL LOW (ref 26.6–33.0)
MEAN CORPUSCULAR VOLUME: 80 fL (ref 79–97)
MONOCYTES ABSOLUTE COUNT: 0.8 x10E3/uL (ref 0.1–0.9)
MONOCYTES RELATIVE PERCENT: 9 %
NEUTROPHILS ABSOLUTE COUNT: 5.5 x10E3/uL (ref 1.4–7.0)
NEUTROPHILS RELATIVE PERCENT: 59 %
PLATELET COUNT: 502 x10E3/uL — ABNORMAL HIGH (ref 150–450)
RED BLOOD CELL COUNT: 4.85 x10E6/uL (ref 3.77–5.28)
RED CELL DISTRIBUTION WIDTH: 14.5 % (ref 11.7–15.4)
WHITE BLOOD CELL COUNT: 9.4 x10E3/uL (ref 3.4–10.8)

## 2024-10-11 LAB — IRON & TIBC
IRON SATURATION: 13 % — ABNORMAL LOW (ref 15–55)
IRON: 36 ug/dL (ref 27–159)
TOTAL IRON BINDING CAPACITY: 268 ug/dL (ref 250–450)
UNSATURATED IRON BINDING CAPACITY: 232 ug/dL (ref 131–425)

## 2024-10-11 LAB — TSH: THYROID STIMULATING HORMONE: 0.336 u[IU]/mL — ABNORMAL LOW (ref 0.450–4.500)

## 2024-10-11 LAB — C-REACTIVE PROTEIN: C-REACTIVE PROTEIN: 17 mg/L — ABNORMAL HIGH (ref 0–10)

## 2024-10-11 LAB — SEDIMENTATION RATE: ERYTHROCYTE SEDIMENTATION RATE: 109 mm/h — ABNORMAL HIGH (ref 0–40)

## 2024-10-11 LAB — T4, FREE: FREE T4: 1.45 ng/dL (ref 0.82–1.77)

## 2024-10-11 LAB — VITAMIN D 25 HYDROXY: VITAMIN D 25-HYDROXY: 26.2 ng/mL — ABNORMAL LOW (ref 30.0–100.0)

## 2024-10-11 LAB — HEMOGLOBIN A1C: HEMOGLOBIN A1C: 5.9 % — ABNORMAL HIGH (ref 4.8–5.6)

## 2024-10-11 MED ORDER — MELOXICAM 7.5 MG TABLET
ORAL_TABLET | Freq: Every day | ORAL | 1 refills | 60.00000 days | Status: CP
Start: 2024-10-11 — End: ?

## 2024-10-17 NOTE — Telephone Encounter (Signed)
 Started Cosentyx  twice a month.  Initially when she started Cosentyx  once a monty she would get a virus. Lymph nodes are swollen.  They gave her Prednisone  to calm it down.  Once her body got used to it it didn't happen again.  Now that she has gone to twice a month similar sx.  Wanting Prednisone .  Recommended she call her dermatologist who prescribed it and that I would also check with Keri.

## 2024-10-21 NOTE — Telephone Encounter (Addendum)
 No answer and phone message, not accepting calls at this time.  Sent MyChart message.

## 2024-10-23 NOTE — Progress Notes (Signed)
 [facility] Specialty and Home Delivery Pharmacy Refill Coordination Note    Heather Watts, [place]: 1971-02-26  Phone: There are no phone numbers on file.      All above HIPAA information was verified with patient.         10/21/2024     5:27 PM   Specialty Rx Medication Refill Questionnaire   Which Medications would you like refilled and shipped? Consentyx   Please list all current allergies: None   Have you missed any doses in the last 30 days? No   Have you had any changes to your medication(s) since your last refill? No   How much of each medication do you have remaining at home? (eg. number of tablets, injections, etc.) None   If receiving an injectable medication, next injection date is 10/30/2024   Have you experienced any side effects in the last 30 days? No   Please enter the full address (street address, city, state, zip code) where you would like your medication(s) to be delivered to. [address] [place] [address]   Please specify on which day you would like your medication(s) to arrive. Note: if you need your medication(s) within 3 days, please call the pharmacy to schedule your order at [phone]  10/29/2024   Has your insurance changed since your last refill? No   Would you like a pharmacist to call you to discuss your medication(s)? No   Do you require a signature for your package? (Note: if we are billing Medicare Part B or your order contains a controlled substance, we will require a signature) No   I have been provided my out of pocket cost for my medication and approve the pharmacy to charge the amount to my credit card on file. Yes         Completed refill call assessment today to schedule patient's medication shipment from the [facility] and [facility] Delivery Pharmacy [phone]).  All relevant notes have been reviewed.       Confirmed patient received a Conservation Officer, Historic Buildings and a Surveyor, Mining with first shipment. The patient will receive a drug information handout for each medication shipped and additional FDA Medication Guides as required.         REFERRAL TO PHARMACIST     Referral to the pharmacist: Not needed      [facility]     Shipping address confirmed in Epic.     Delivery Scheduled: Yes, Expected medication delivery date: 10/29/24.     Medication will be delivered via Next Day Courier to the prescription address in Epic WAM.    [name]   [facility] Special[facility] and Home Delivery Pharmacy Specialty Technician

## 2024-10-28 MED FILL — COSENTYX UNOREADY PEN 300 MG/2 ML SUBCUTANEOUS PEN INJECTOR: SUBCUTANEOUS | 28 days supply | Qty: 4 | Fill #1

## 2024-11-02 DIAGNOSIS — L732 Hidradenitis suppurativa: Principal | ICD-10-CM

## 2024-11-02 MED ORDER — DOXYCYCLINE HYCLATE 100 MG CAPSULE
ORAL_CAPSULE | Freq: Two times a day (BID) | ORAL | 0 refills | 7.00000 days | Status: CP
Start: 2024-11-02 — End: 2024-11-09

## 2024-11-02 NOTE — Patient Instructions (Signed)
-  You have been prescribed doxycycline  for your HS  -Please follow up with your dermatologist next week  -If you develop fever over 101.5, severe pain, vomiting, or any concerns, go to the emergency department

## 2024-11-02 NOTE — Progress Notes (Addendum)
 [facility] Encounter  This medical encounter was conducted virtually using [email or web address]  TeleHealth protocols.    Patient ID: Heather Watts is a 53 y.o. female who presents by video interaction for evaluation.    I have identified myself to the patient and conveyed my credentials to [name].   Patient has signed informed consent on file in medical record.    Present on Video Call: Is there someone else in the room? No..      Assessment/Plan:    Diagnoses and all orders for this visit:    Hidradenitis suppurativa  -     doxycycline  (VIBRAMYCIN ) 100 MG capsule; Take 1 capsule (100 mg total) by mouth two (2) times a day for 7 days.         -Doxycycline   -Follow up with dermatologist next week  -ED precautions given     Briefly wanted to discuss her chronic joint pain, she is on meloxicam  and will continue until follow up     -- Discussed the new prescription noted above, including potential side effects, drug interactions, instructions for taking the medication, and the consequences of not taking it.  -- Patient verbalized an understanding of today's assessment and recommendations, as well as the purpose of ongoing medications.    Follow-up with PCP        Medication adherence and barriers to the treatment plan have been addressed. Opportunities to optimize healthy behaviors have been discussed. Patient / caregiver voiced understanding.        Subjective:     HPI  Heather Watts is 53 y.o. and presents today in the [facility] with hidradenitis suppurative symptoms.  The PCP for this patient is [name], FNP.     State hx of HA  Having a flare  Requesting Abx      ROS  Review of Systems     All other ROS per HPI.    I have reviewed the problem list, past medical history, past family history, medications, and allergies and have updated/reconciled them if needed.          Objective:   Physical Exam  As part of this Video Visit, no in-person exam was conducted.  Video interaction permitted the following observations.    General: No acute distress.   HEENT:  EOMI.  No photophobia. No conjunctival injection.    RESP: Relaxed respiratory effort. No conversational dyspnea.   SKIN: No rashes noted.  NEURO: No tremors observed.  Normal speech.   PSYCH: Alert and oriented.  Speech fluent and sensible.  Calm affect.           The patient reports they are physically located in [place]  and is currently: at home. I conducted a audio/video visit. I spent  62m 04s on the video call with the patient. I spent an additional 2 minutes on pre- and post-visit activities on the date of service .

## 2024-12-05 DIAGNOSIS — E559 Vitamin D deficiency, unspecified: Secondary | ICD-10-CM

## 2024-12-05 DIAGNOSIS — L732 Hidradenitis suppurativa: Principal | ICD-10-CM

## 2024-12-05 MED ORDER — CLINDAMYCIN HCL 300 MG CAPSULE
ORAL_CAPSULE | Freq: Four times a day (QID) | ORAL | 0 refills | 7.00000 days | Status: CP
Start: 2024-12-05 — End: 2024-12-12

## 2024-12-05 MED ORDER — ERGOCALCIFEROL (VITAMIN D2) 1,250 MCG (50,000 UNIT) CAPSULE
ORAL_CAPSULE | ORAL | 0 refills | 84.00000 days | Status: CP
Start: 2024-12-05 — End: ?

## 2024-12-05 NOTE — Progress Notes (Signed)
 [facility] Encounter  This medical encounter was conducted virtually using [email or web address]  TeleHealth protocols.    Patient ID: Heather Watts is a 54 y.o. female who presents by video interaction for evaluation.    I have identified myself to the patient and conveyed my credentials to [name].   Patient has signed informed consent on file in medical record.    Present on Video Call: Is there someone else in the room? No..      Assessment/Plan:      Problem List Items Addressed This Visit          Musculoskeletal and Integument    Hidradenitis suppurativa - Primary    Has a history of HS. She missed her secukinumab  injection due to the snow.  3 days ago, noticed a abscess under the RIGHT breast.  Feels like it is already improving, but is very large with thick yellow discharge.  She feels like it smells like her usual HS discharge.  No fevers, chills, or body aches.  Denies hx of MRSA.  Advised I would recommend she go in for an in-person visit tomorrow so it can be cleaned, drained, packed if needed, cultures can be done, etc.  Will start clindamycin  300mg  QID X 7 days.  Follow up if symptoms persist or worsen.         Relevant Medications    clindamycin  (CLEOCIN ) 300 MG capsule       Other    Vitamin D  deficiency    Feels her HS is better controlled when she is taking weekly high dose vitamin D .  Last vitamin D  level in 10/2024 was low.  Will send in refill for vitamin D  50,000 units weekly.         Relevant Medications    ergocalciferol  (DRISDOL ) 1,250 mcg (50,000 unit) capsule     -- Patient verbalized an understanding of today's assessment and recommendations, as well as the purpose of ongoing medications.    Follow-up with PCP    Medication adherence and barriers to the treatment plan have been addressed. Opportunities to optimize healthy behaviors have been discussed. Patient / caregiver voiced understanding.     Subjective:     HPI  Heather Watts is 54 y.o. and presents today in the [facility] with symptoms.  The PCP for this patient is [name], FNP.     Has a history of HS. She missed her secukinumab  injection due to the snow.  3 days ago, noticed a abscess under the RIGHT breast.  Feels like it is already improving, but is very large with thick yellow discharge.  She feels like it smells like her usual HS discharge.  No fevers, chills, or body aches.  Denies hx of MRSA.  Feels her HS is better controlled when she is taking weekly high dose vitamin D .    ROS  Review of Systems     All other ROS per HPI.    I have reviewed the problem list, past medical history, past family history, medications, and allergies and have updated/reconciled them if needed.     Objective:   Physical Exam  As part of this Video Visit, no in-person exam was conducted.  Video interaction permitted the following observations.    General: No acute distress.   HEENT:  EOMI.  No photophobia. No conjunctival injection.    RESP: Relaxed respiratory effort. No conversational dyspnea.   SKIN: See image below.  NEURO: No tremors observed.  Normal speech.   PSYCH: Alert and oriented.  Speech fluent and sensible.  Calm affect.       The patient reports they are physically located in [place]  and is currently: at home. I conducted a audio/video visit. I spent  26m 53s on the video call with the patient. I spent an additional 15 minutes on pre- and post-visit activities on the date of service .

## 2024-12-05 NOTE — Assessment & Plan Note (Signed)
 Feels her HS is better controlled when she is taking weekly high dose vitamin D .  Last vitamin D  level in 10/2024 was low.  Will send in refill for vitamin D  50,000 units weekly.

## 2024-12-05 NOTE — Progress Notes (Signed)
 [facility] Specialty and Home Delivery Pharmacy Refill Coordination Note    Specialty Medication(s) to be Shipped:   Inflammatory Disorders: Cosentyx     Other medication(s) to be shipped: No additional medications requested for fill at this time    Specialty Medications not needed at this time: N/A     Heather Watts, DOB: 1971/09/02  Phone: There are no phone numbers on file.      All above HIPAA information was verified with patient.     Was a nurse, learning disability used for this call? No    Completed refill call assessment today to schedule patient's medication shipment from the [facility] and Home Delivery Pharmacy  [phone]).  All relevant notes have been reviewed.     Specialty medication(s) and dose(s) confirmed: Regimen is correct and unchanged.   Changes to medications: Heather Watts reports no changes at this time.  Changes to insurance: No  New side effects reported not previously addressed with a pharmacist or physician: None reported  Questions for the pharmacist: No    Confirmed patient received a Conservation Officer, Historic Buildings and a Surveyor, Mining with first shipment. The patient will receive a drug information handout for each medication shipped and additional FDA Medication Guides as required.       DISEASE/MEDICATION-SPECIFIC INFORMATION        N/A    SPECIALTY MEDICATION ADHERENCE     Medication Adherence    Patient reported X missed doses in the last month: 0  Specialty Medication: COSENTYX  UNOREADY PEN 300 mg/2 mL Pnij (secukinumab )  Patient is on additional specialty medications: No  Patient is on more than two specialty medications: No  Any gaps in refill history greater than 2 weeks in the last 3 months: no  Demonstrates understanding of importance of adherence: yes              Were doses missed due to medication being on hold? No        COSENTYX  UNOREADY PEN 300 mg/2 mL Pnij (secukinumab ): 0  doses of medicine on hand         Specialty medication is an injection or given on a cycle: Yes, Next injection is scheduled for 12/10/24 .    REFERRAL TO PHARMACIST     Referral to the pharmacist: Not needed      [facility]     Shipping address confirmed in Epic.     Cost and Payment: Patient has a $0 copay, payment information is not required.    Delivery Scheduled: Yes, Expected medication delivery date: 12/10/24 .     Medication will be delivered via Next Day Courier to the prescription address in Epic WAM.    Heather Watts   [facility] Specialty and Home Delivery Pharmacy  Specialty Technician

## 2024-12-05 NOTE — Assessment & Plan Note (Signed)
 Has a history of HS. She missed her secukinumab  injection due to the snow.  3 days ago, noticed a abscess under the RIGHT breast.  Feels like it is already improving, but is very large with thick yellow discharge.  She feels like it smells like her usual HS discharge.  No fevers, chills, or body aches.  Denies hx of MRSA.  Advised I would recommend she go in for an in-person visit tomorrow so it can be cleaned, drained, packed if needed, cultures can be done, etc.  Will start clindamycin  300mg  QID X 7 days.  Follow up if symptoms persist or worsen.

## 2024-12-06 ENCOUNTER — Other Ambulatory Visit: Payer: Self-pay

## 2024-12-06 ENCOUNTER — Emergency Department
Admission: EM | Admit: 2024-12-06 | Discharge: 2024-12-06 | Disposition: A | Discharge status: Home / Self Care | Source: Ambulatory Visit | Attending: Emergency Medicine | Admitting: Emergency Medicine

## 2024-12-06 DIAGNOSIS — Z4800 Encounter for change or removal of nonsurgical wound dressing: Secondary | ICD-10-CM | POA: Diagnosis present

## 2024-12-06 DIAGNOSIS — I10 Essential (primary) hypertension: Secondary | ICD-10-CM | POA: Insufficient documentation

## 2024-12-06 DIAGNOSIS — N611 Abscess of the breast and nipple: Secondary | ICD-10-CM | POA: Insufficient documentation

## 2024-12-06 LAB — CBC WITH DIFFERENTIAL/PLATELET
Abs Immature Granulocytes: 0.07 10*3/uL (ref 0.00–0.07)
Basophils Absolute: 0.1 10*3/uL (ref 0.0–0.1)
Basophils Relative: 1 %
Eosinophils Absolute: 0.3 10*3/uL (ref 0.0–0.5)
Eosinophils Relative: 3 %
HCT: 37.9 % (ref 36.0–46.0)
Hemoglobin: 11.6 g/dL — ABNORMAL LOW (ref 12.0–15.0)
Immature Granulocytes: 1 %
Lymphocytes Relative: 25 %
Lymphs Abs: 2.6 10*3/uL (ref 0.7–4.0)
MCH: 23.8 pg — ABNORMAL LOW (ref 26.0–34.0)
MCHC: 30.6 g/dL (ref 30.0–36.0)
MCV: 77.8 fL — ABNORMAL LOW (ref 80.0–100.0)
Monocytes Absolute: 1.2 10*3/uL — ABNORMAL HIGH (ref 0.1–1.0)
Monocytes Relative: 11 %
Neutro Abs: 6.3 10*3/uL (ref 1.7–7.7)
Neutrophils Relative %: 59 %
Platelets: 453 10*3/uL — ABNORMAL HIGH (ref 150–400)
RBC: 4.87 MIL/uL (ref 3.87–5.11)
RDW: 15.2 % (ref 11.5–15.5)
WBC: 10.5 10*3/uL (ref 4.0–10.5)
nRBC: 0 % (ref 0.0–0.2)

## 2024-12-06 LAB — BASIC METABOLIC PANEL WITH GFR
Anion gap: 11 (ref 5–15)
BUN: 17 mg/dL (ref 6–20)
CO2: 22 mmol/L (ref 22–32)
Calcium: 10 mg/dL (ref 8.9–10.3)
Chloride: 101 mmol/L (ref 98–111)
Creatinine, Ser: 0.82 mg/dL (ref 0.44–1.00)
GFR, Estimated: >60 mL/min
Glucose, Bld: 132 mg/dL — ABNORMAL HIGH (ref 70–99)
Potassium: 3.8 mmol/L (ref 3.5–5.1)
Sodium: 133 mmol/L — ABNORMAL LOW (ref 135–145)

## 2024-12-06 LAB — LACTIC ACID, PLASMA: Lactic Acid, Venous: 0.7 mmol/L (ref 0.5–1.9)

## 2024-12-06 MED ORDER — CEPHALEXIN 500 MG PO CAPS: 500.0000 mg | ORAL_CAPSULE | Freq: Four times a day (QID) | ORAL | 0 refills | Status: AC

## 2024-12-06 NOTE — ED Triage Notes (Signed)
 Wound to R breast with hx of HS. Large open and draining wound noted. Reports first noted and ruptured Monday. No abx at home. Only tylenol  for pain. Denies fever. Pt ambulatory. Alert and oriented. Breathing unlabored speaking in full sentences with symmetric chest rise and fall.

## 2024-12-06 NOTE — ED Provider Notes (Signed)
 "  [facility] Provider Note    Event Date/Time   First MD Initiated Contact with Patient 12/06/24 [phone]     (approximate)   History   Wound Check   HPI  Alice Stephens is a 54 y.o. female with PMH of hypertension, hidradenitis suppurativa, GERD and hyperlipidemia presents for evaluation of a wound to the right breast.  Patient states that she often has abscesses and drainage from this breast.  She was seen by an online provider yesterday who recommended she come to the ED for evaluation as they thought it might need culture or debridement.  Patient states that she does not have much pain.  No fevers.  She reports that it first opened up and began to drain on Monday.  She said initially she had pain over Monday and Tuesday but that the pain has improved.      Physical Exam   Triage Vital Signs: ED Triage Vitals  Encounter Vitals Group     BP 12/06/24 0239 (!) 142/95     Girls Systolic BP Percentile --      Girls Diastolic BP Percentile --      Boys Systolic BP Percentile --      Boys Diastolic BP Percentile --      Pulse Rate 12/06/24 0239 (!) 107     Resp 12/06/24 0239 18     Temp 12/06/24 0239 98.7 F (37.1 C)     Temp Source 12/06/24 0239 Oral     SpO2 12/06/24 0239 98 %     Weight 12/06/24 0237 225 lb (102.1 kg)     Height 12/06/24 0237 5' 8 (1.727 m)     Head Circumference --      Peak Flow --      Pain Score 12/06/24 0237 7     Pain Loc --      Pain Education --      Exclude from Growth Chart --     Most recent vital signs: Vitals:   12/06/24 0239 12/06/24 0843  BP: (!) 142/95 135/88  Pulse: (!) 107 88  Resp: 18 18  Temp: 98.7 F (37.1 C) 98 F (36.7 C)  SpO2: 98% 98%   General: Awake, no distress.  CV:  Good peripheral perfusion.  Resp:  Normal effort.  Abd:  No distention.  Other:  Open and draining ulceration to the medial right breast with purulent drainage     ED Results / Procedures / Treatments   Labs (all labs  ordered are listed, but only abnormal results are displayed) Labs Reviewed  BASIC METABOLIC PANEL WITH GFR - Abnormal; Notable for the following components:      Result Value   Sodium 133 (*)    Glucose, Bld 132 (*)    All other components within normal limits  CBC WITH DIFFERENTIAL/PLATELET - Abnormal; Notable for the following components:   Hemoglobin 11.6 (*)    MCV 77.8 (*)    MCH 23.8 (*)    Platelets 453 (*)    Monocytes Absolute 1.2 (*)    All other components within normal limits  AEROBIC CULTURE W GRAM STAIN (SUPERFICIAL SPECIMEN)  LACTIC ACID, PLASMA  LACTIC ACID, PLASMA    PROCEDURES:  Critical Care performed: No  Procedures   MEDICATIONS ORDERED IN ED: Medications - No data to display   IMPRESSION / MDM / ASSESSMENT AND PLAN / ED COURSE  I reviewed the triage vital signs and the nursing notes.  54 year old female presents for evaluation of wound check to the right breast.  Vital signs stable patient NAD on exam.  Differential diagnosis includes, but is not limited to, hidradenitis suppurativa, abscess, Paget's disease, inflammatory breast cancer.  Patient's presentation is most consistent with acute complicated illness / injury requiring diagnostic workup.  CBC shows mild anemia otherwise unremarkable.  BMP unremarkable.  Lactic not elevated.  Will obtain wound culture.  Patient states that she normally gets shots every 2 weeks of a biologic medication for treatment of her hidradenitis suppurativa.  She has not been able get the medication for the past month due to insurance changes.  Patient believes this is why the abscess has gotten so bad.  Since wound is actively draining do not see indication for I&D.  Did obtain a wound culture.  I applied a wet-to-dry dressing and instructed patient on how to do the same.  She was prescribed clindamycin by the online provider yesterday.  Patient's previously taken doxycycline  but has been on it  several times for infections and does not feel that it is effective anymore.  Will also add Keflex  for additional coverage.  Emphasized the extreme importance that patient follow-up with the breast center to ensure that this is not due to inflammatory breast cancer or Paget's disease.  Will also give patient follow-up information for general surgery as she may need wound debridement.  I also placed a referral for the wound care center.  Patient voiced understanding, all questions were answered and she is stable at discharge.      FINAL CLINICAL IMPRESSION(S) / ED DIAGNOSES   Final diagnoses:  Breast abscess     Rx / DC Orders   ED Discharge Orders          Ordered    Ambulatory referral to Wound Clinic        12/06/24 0817    cephALEXin  (KEFLEX ) 500 MG capsule  4 times daily        12/06/24 9160             Note:  This document was prepared using Dragon voice recognition software and may include unintentional dictation errors.   Cleaster Tinnie LABOR, PA-C 12/06/24 1307    Suzanne Kirsch, MD 12/06/24 1844  "

## 2024-12-06 NOTE — ED Notes (Signed)
 See triage note  Presents with possible abscess area under right breast  Developed some drainage on Monday No fever

## 2024-12-06 NOTE — ED Provider Notes (Signed)
 Shared visit   Presents to the emergency department for wound under her right breast.  States that she has a history of hidradenitis.  Recently started on clindamycin but has not yet started that medication before.  States that she has taken doxycycline  in the past and does not feel that that helped her.  States that she was on biologic agents every 2 weeks and that she has not had it over the past 4 weeks given that she has had difficulties getting it with change of insurance.  States that she also has frequent recurrences whenever she does not take this medication.  Not yet has received mammograms.  Low concern for breast abscess.  Actively draining at this time.  Will restart on Keflex  and injection to clindamycin.  Given a referral to wound care.  Given information to follow-up with general surgery.  Given information to follow-up in the breast center for mammogram and further breast cancer workup.  Discussed return precautions for any ongoing or worsening symptoms.  No questions or concerns at time of discharge.   Suzanne Kirsch, MD 12/06/24 [phone]

## 2024-12-06 NOTE — Discharge Instructions (Addendum)
 While in the emergency department a culture of your wound was taken today.  This will help determine the type of bacteria that is causing the infection and we will make sure that the antibiotic you have been placed on will treat the infection.  You will only receive a phone call about these results if there needs to be a change in the antibiotic treatment.  Please pick up and begin taking the previously prescribed clindamycin in addition to the Keflex .  Make sure you take these medications as prescribed and take all of the medication even if the infection seems to be healing.  You will need to do at least once daily dressing changes.  This is done as a wet-to-dry dressing.  Please wet the first layer of gauze with saline solution, squeeze out the excess water and place that directly against the wound.  Then you can place dry gauze underneath it.  This is to help keep the wound bed moist which will promote healing.    I have placed a referral for a wound clinic.  Their contact information is below.  Please reach out to them if you do not hear back in a few days.  [facility] at [facility] [address], [place], [place] [address] [phone]  I have also attached information for the breast center.  Please schedule an appointment there for some breast imaging to ensure that these infections are truly coming from at bedtime and not breast cancer.  [facility] at [facility] [address], [place], [place] [address] [phone] Please call to schedule a follow up appointment.  Please return to the emergency department with any worsening symptoms.

## 2024-12-09 LAB — AEROBIC CULTURE W GRAM STAIN (SUPERFICIAL SPECIMEN)

## 2024-12-09 MED FILL — COSENTYX UNOREADY PEN 300 MG/2 ML SUBCUTANEOUS PEN INJECTOR: 28 days supply | Qty: 4 | Fill #2

## 2024-12-10 NOTE — Telephone Encounter (Signed)
 Patient left a voice message to have someone call her back at [phone] to schedule a sooner appointment with Dr.Miedema for a re-assess. Please advise.
